# Patient Record
Sex: Female | Born: 1945 | Race: White | Hispanic: No | Marital: Married | Smoking: Never smoker
Health system: Southern US, Community
[De-identification: ages and names within clinical notes are randomized; demographics above are authoritative.]

## PROBLEM LIST (undated history)

## (undated) DIAGNOSIS — Z9889 Other specified postprocedural states: Secondary | ICD-10-CM

## (undated) DIAGNOSIS — J45909 Unspecified asthma, uncomplicated: Secondary | ICD-10-CM

## (undated) DIAGNOSIS — J189 Pneumonia, unspecified organism: Secondary | ICD-10-CM

## (undated) DIAGNOSIS — F32A Depression, unspecified: Secondary | ICD-10-CM

## (undated) DIAGNOSIS — F419 Anxiety disorder, unspecified: Secondary | ICD-10-CM

## (undated) DIAGNOSIS — I10 Essential (primary) hypertension: Secondary | ICD-10-CM

## (undated) DIAGNOSIS — M1990 Unspecified osteoarthritis, unspecified site: Secondary | ICD-10-CM

## (undated) DIAGNOSIS — G4730 Sleep apnea, unspecified: Secondary | ICD-10-CM

## (undated) DIAGNOSIS — F329 Major depressive disorder, single episode, unspecified: Secondary | ICD-10-CM

## (undated) DIAGNOSIS — J329 Chronic sinusitis, unspecified: Secondary | ICD-10-CM

## (undated) DIAGNOSIS — R112 Nausea with vomiting, unspecified: Secondary | ICD-10-CM

## (undated) DIAGNOSIS — E039 Hypothyroidism, unspecified: Secondary | ICD-10-CM

## (undated) DIAGNOSIS — K219 Gastro-esophageal reflux disease without esophagitis: Secondary | ICD-10-CM

## (undated) DIAGNOSIS — E7800 Pure hypercholesterolemia, unspecified: Secondary | ICD-10-CM

## (undated) DIAGNOSIS — K224 Dyskinesia of esophagus: Secondary | ICD-10-CM

## (undated) DIAGNOSIS — R000 Tachycardia, unspecified: Secondary | ICD-10-CM

## (undated) HISTORY — PX: BREAST ENHANCEMENT SURGERY: SHX7

## (undated) HISTORY — PX: CERVICAL SPINE SURGERY: SHX589

## (undated) HISTORY — PX: COMBINED AUGMENTATION MAMMAPLASTY AND ABDOMINOPLASTY: SUR291

## (undated) HISTORY — DX: Chronic sinusitis, unspecified: J32.9

## (undated) HISTORY — PX: AUGMENTATION MAMMAPLASTY: SUR837

## (undated) HISTORY — DX: Tachycardia, unspecified: R00.0

## (undated) HISTORY — DX: Unspecified asthma, uncomplicated: J45.909

## (undated) HISTORY — DX: Dyskinesia of esophagus: K22.4

## (undated) HISTORY — DX: Sleep apnea, unspecified: G47.30

---

## 1997-12-03 ENCOUNTER — Ambulatory Visit (HOSPITAL_COMMUNITY): Admission: RE | Admit: 1997-12-03 | Discharge: 1997-12-03 | Payer: Self-pay | Admitting: Internal Medicine

## 1998-02-02 ENCOUNTER — Encounter: Payer: Self-pay | Admitting: Emergency Medicine

## 1998-02-02 ENCOUNTER — Emergency Department (HOSPITAL_COMMUNITY): Admission: EM | Admit: 1998-02-02 | Discharge: 1998-02-02 | Payer: Self-pay | Admitting: Emergency Medicine

## 1998-02-11 ENCOUNTER — Other Ambulatory Visit: Admission: RE | Admit: 1998-02-11 | Discharge: 1998-02-11 | Payer: Self-pay | Admitting: *Deleted

## 1999-03-03 ENCOUNTER — Other Ambulatory Visit: Admission: RE | Admit: 1999-03-03 | Discharge: 1999-03-03 | Payer: Self-pay | Admitting: *Deleted

## 1999-03-30 ENCOUNTER — Encounter: Admission: RE | Admit: 1999-03-30 | Discharge: 1999-03-30 | Payer: Self-pay | Admitting: *Deleted

## 1999-04-13 ENCOUNTER — Encounter: Payer: Self-pay | Admitting: *Deleted

## 1999-04-13 ENCOUNTER — Encounter: Admission: RE | Admit: 1999-04-13 | Discharge: 1999-04-13 | Payer: Self-pay | Admitting: *Deleted

## 2000-04-12 ENCOUNTER — Other Ambulatory Visit: Admission: RE | Admit: 2000-04-12 | Discharge: 2000-04-12 | Payer: Self-pay | Admitting: *Deleted

## 2001-04-27 ENCOUNTER — Other Ambulatory Visit: Admission: RE | Admit: 2001-04-27 | Discharge: 2001-04-27 | Payer: Self-pay | Admitting: *Deleted

## 2001-05-04 ENCOUNTER — Encounter: Admission: RE | Admit: 2001-05-04 | Discharge: 2001-05-04 | Payer: Self-pay | Admitting: *Deleted

## 2001-05-04 ENCOUNTER — Encounter: Payer: Self-pay | Admitting: *Deleted

## 2002-03-16 ENCOUNTER — Other Ambulatory Visit: Admission: RE | Admit: 2002-03-16 | Discharge: 2002-03-16 | Payer: Self-pay | Admitting: *Deleted

## 2002-07-18 ENCOUNTER — Encounter: Payer: Self-pay | Admitting: Rheumatology

## 2002-07-18 ENCOUNTER — Ambulatory Visit (HOSPITAL_COMMUNITY): Admission: RE | Admit: 2002-07-18 | Discharge: 2002-07-18 | Payer: Self-pay | Admitting: Rheumatology

## 2003-03-18 ENCOUNTER — Other Ambulatory Visit: Admission: RE | Admit: 2003-03-18 | Discharge: 2003-03-18 | Payer: Self-pay | Admitting: *Deleted

## 2007-06-15 HISTORY — PX: HEMORROIDECTOMY: SUR656

## 2007-11-06 ENCOUNTER — Emergency Department (HOSPITAL_COMMUNITY): Admission: EM | Admit: 2007-11-06 | Discharge: 2007-11-06 | Payer: Self-pay | Admitting: Emergency Medicine

## 2007-12-04 ENCOUNTER — Ambulatory Visit: Payer: Self-pay | Admitting: Pulmonary Disease

## 2007-12-04 DIAGNOSIS — R519 Headache, unspecified: Secondary | ICD-10-CM | POA: Insufficient documentation

## 2007-12-04 DIAGNOSIS — I10 Essential (primary) hypertension: Secondary | ICD-10-CM | POA: Insufficient documentation

## 2007-12-04 DIAGNOSIS — R059 Cough, unspecified: Secondary | ICD-10-CM | POA: Insufficient documentation

## 2007-12-04 DIAGNOSIS — R05 Cough: Secondary | ICD-10-CM

## 2007-12-04 DIAGNOSIS — E785 Hyperlipidemia, unspecified: Secondary | ICD-10-CM | POA: Insufficient documentation

## 2007-12-04 DIAGNOSIS — J309 Allergic rhinitis, unspecified: Secondary | ICD-10-CM | POA: Insufficient documentation

## 2007-12-04 DIAGNOSIS — J45909 Unspecified asthma, uncomplicated: Secondary | ICD-10-CM | POA: Insufficient documentation

## 2007-12-04 DIAGNOSIS — R51 Headache: Secondary | ICD-10-CM | POA: Insufficient documentation

## 2007-12-05 ENCOUNTER — Telehealth (INDEPENDENT_AMBULATORY_CARE_PROVIDER_SITE_OTHER): Payer: Self-pay | Admitting: *Deleted

## 2008-03-06 ENCOUNTER — Emergency Department (HOSPITAL_BASED_OUTPATIENT_CLINIC_OR_DEPARTMENT_OTHER): Admission: EM | Admit: 2008-03-06 | Discharge: 2008-03-06 | Payer: Self-pay | Admitting: Emergency Medicine

## 2010-02-14 ENCOUNTER — Emergency Department (HOSPITAL_COMMUNITY): Admission: EM | Admit: 2010-02-14 | Discharge: 2010-02-14 | Payer: Self-pay | Admitting: Emergency Medicine

## 2010-08-27 LAB — POCT CARDIAC MARKERS
CKMB, poc: 1.1 ng/mL (ref 1.0–8.0)
CKMB, poc: <1 ng/mL — ABNORMAL LOW (ref 1.0–8.0)
Myoglobin, poc: 75.5 ng/mL (ref 12–200)
Myoglobin, poc: 91.4 ng/mL (ref 12–200)
Troponin i, poc: <0.05 ng/mL (ref 0.00–0.09)
Troponin i, poc: <0.05 ng/mL (ref 0.00–0.09)

## 2010-08-27 LAB — URINALYSIS, ROUTINE W REFLEX MICROSCOPIC
Bilirubin Urine: NEGATIVE
Hgb urine dipstick: NEGATIVE
Specific Gravity, Urine: 1.013 (ref 1.005–1.030)
Urobilinogen, UA: 0.2 mg/dL (ref 0.0–1.0)
pH: 8 (ref 5.0–8.0)

## 2010-08-27 LAB — DIFFERENTIAL
Basophils Absolute: 0.1 10*3/uL (ref 0.0–0.1)
Eosinophils Relative: 0 % (ref 0–5)
Lymphocytes Relative: 11 % — ABNORMAL LOW (ref 12–46)
Neutro Abs: 10.7 10*3/uL — ABNORMAL HIGH (ref 1.7–7.7)

## 2010-08-27 LAB — COMPREHENSIVE METABOLIC PANEL
BUN: 22 mg/dL (ref 6–23)
CO2: 26 mEq/L (ref 19–32)
Chloride: 103 mEq/L (ref 96–112)
Creatinine, Ser: 0.65 mg/dL (ref 0.4–1.2)
GFR calc non Af Amer: >60 mL/min (ref >60)
Total Bilirubin: 1.3 mg/dL — ABNORMAL HIGH (ref 0.3–1.2)

## 2010-08-27 LAB — CBC
Hemoglobin: 14.4 g/dL (ref 12.0–15.0)
MCH: 29.1 pg (ref 26.0–34.0)
MCV: 84.4 fL (ref 78.0–100.0)
RBC: 4.95 MIL/uL (ref 3.87–5.11)

## 2010-08-27 LAB — D-DIMER, QUANTITATIVE: D-Dimer, Quant: 0.36 ug/mL-FEU (ref 0.00–0.48)

## 2010-12-26 ENCOUNTER — Emergency Department (HOSPITAL_BASED_OUTPATIENT_CLINIC_OR_DEPARTMENT_OTHER)
Admission: EM | Admit: 2010-12-26 | Discharge: 2010-12-26 | Disposition: A | Discharge status: Home / Self Care | Payer: Medicare Other | Attending: Emergency Medicine | Admitting: Emergency Medicine

## 2010-12-26 ENCOUNTER — Encounter: Payer: Self-pay | Admitting: Emergency Medicine

## 2010-12-26 DIAGNOSIS — S61219A Laceration without foreign body of unspecified finger without damage to nail, initial encounter: Secondary | ICD-10-CM

## 2010-12-26 DIAGNOSIS — W268XXA Contact with other sharp object(s), not elsewhere classified, initial encounter: Secondary | ICD-10-CM | POA: Insufficient documentation

## 2010-12-26 DIAGNOSIS — E039 Hypothyroidism, unspecified: Secondary | ICD-10-CM | POA: Insufficient documentation

## 2010-12-26 DIAGNOSIS — I10 Essential (primary) hypertension: Secondary | ICD-10-CM | POA: Insufficient documentation

## 2010-12-26 DIAGNOSIS — M810 Age-related osteoporosis without current pathological fracture: Secondary | ICD-10-CM | POA: Insufficient documentation

## 2010-12-26 DIAGNOSIS — S61209A Unspecified open wound of unspecified finger without damage to nail, initial encounter: Secondary | ICD-10-CM | POA: Insufficient documentation

## 2010-12-26 DIAGNOSIS — F341 Dysthymic disorder: Secondary | ICD-10-CM | POA: Insufficient documentation

## 2010-12-26 DIAGNOSIS — Z79899 Other long term (current) drug therapy: Secondary | ICD-10-CM | POA: Insufficient documentation

## 2010-12-26 DIAGNOSIS — K219 Gastro-esophageal reflux disease without esophagitis: Secondary | ICD-10-CM | POA: Insufficient documentation

## 2010-12-26 DIAGNOSIS — E7800 Pure hypercholesterolemia, unspecified: Secondary | ICD-10-CM | POA: Insufficient documentation

## 2010-12-26 HISTORY — DX: Unspecified osteoarthritis, unspecified site: M19.90

## 2010-12-26 HISTORY — DX: Pure hypercholesterolemia, unspecified: E78.00

## 2010-12-26 HISTORY — DX: Essential (primary) hypertension: I10

## 2010-12-26 HISTORY — DX: Major depressive disorder, single episode, unspecified: F32.9

## 2010-12-26 HISTORY — DX: Gastro-esophageal reflux disease without esophagitis: K21.9

## 2010-12-26 HISTORY — DX: Hypothyroidism, unspecified: E03.9

## 2010-12-26 HISTORY — DX: Depression, unspecified: F32.A

## 2010-12-26 HISTORY — DX: Anxiety disorder, unspecified: F41.9

## 2010-12-26 MED ORDER — TETANUS-DIPHTH-ACELL PERTUSSIS 5-2.5-18.5 LF-MCG/0.5 IM SUSP
0.5000 mL | Freq: Once | INTRAMUSCULAR | Status: AC
  Administered 2010-12-26: 0.5 mL via INTRAMUSCULAR
  Filled 2010-12-26: qty 0.5

## 2010-12-26 MED ORDER — TETANUS-DIPHTH-ACELL PERTUSSIS 5-2-15.5 LF-MCG/0.5 IM SUSP: 0.5000 mL | Freq: Once | INTRAMUSCULAR | Status: DC

## 2010-12-26 MED ORDER — TETANUS-DIPHTH-ACELL PERTUSSIS 5-2-15.5 LF-MCG/0.5 IM SUSP
INTRAMUSCULAR | Status: AC
  Filled 2010-12-26: qty 0.5

## 2010-12-26 NOTE — ED Provider Notes (Signed)
History     Chief Complaint  Patient presents with  . Laceration   Patient is a 65 y.o. female presenting with skin laceration. The history is provided by the patient.  Laceration  The incident occurred less than 1 hour ago. The laceration is located on the right hand. The laceration is 1 cm in size. The laceration mechanism was a a metal edge. The pain is mild. She reports no foreign bodies present. Her tetanus status is out of date.    Past Medical History  Diagnosis Date  . GERD (gastroesophageal reflux disease)   . Hypercholesteremia   . Hypothyroid   . Hypertension   . Depression   . Osteoporosis   . Anxiety   . Arthritis     No past surgical history on file.  No family history on file.  History  Substance Use Topics  . Smoking status: Never Smoker   . Smokeless tobacco: Not on file  . Alcohol Use: No    OB History    Grav Para Term Preterm Abortions TAB SAB Ect Mult Living                  Review of Systems  Constitutional: Negative.   Musculoskeletal: Negative.   Skin: Positive for wound.  Neurological: Negative.     Physical Exam  BP 154/83  Pulse 73  Temp(Src) 98.2 F (36.8 C) (Oral)  Resp 16  SpO2 98%  Physical Exam  Constitutional: She appears well-developed and well-nourished.  Neck: Normal range of motion.  Pulmonary/Chest: Effort normal.  Musculoskeletal: Normal range of motion.  Skin:       1 cm avulsion to distal right thumb. Distal corner of finger nail missing without nail bed involvement. No active bleeding.     ED Course  Procedures  MDM       Rodena Medin, [place] 12/26/10 1934

## 2011-03-15 LAB — URINALYSIS, ROUTINE W REFLEX MICROSCOPIC
Glucose, UA: NEGATIVE
Nitrite: NEGATIVE
Protein, ur: NEGATIVE

## 2011-03-15 LAB — URINE MICROSCOPIC-ADD ON

## 2011-04-22 ENCOUNTER — Encounter: Payer: Self-pay | Admitting: *Deleted

## 2011-04-22 ENCOUNTER — Encounter: Payer: Self-pay | Admitting: Internal Medicine

## 2011-04-22 ENCOUNTER — Ambulatory Visit (INDEPENDENT_AMBULATORY_CARE_PROVIDER_SITE_OTHER): Payer: Medicare Other | Admitting: Internal Medicine

## 2011-04-22 DIAGNOSIS — E785 Hyperlipidemia, unspecified: Secondary | ICD-10-CM

## 2011-04-22 DIAGNOSIS — I10 Essential (primary) hypertension: Secondary | ICD-10-CM

## 2011-04-22 DIAGNOSIS — R000 Tachycardia, unspecified: Secondary | ICD-10-CM

## 2011-04-22 MED ORDER — LOSARTAN POTASSIUM 100 MG PO TABS: 100.0000 mg | ORAL_TABLET | Freq: Every day | ORAL | Status: DC

## 2011-04-22 NOTE — Patient Instructions (Signed)
Your physician recommends that you schedule a follow-up appointment in:  6 weeks with Dr Johney Frame  Your physician has requested that you have an echocardiogram. Echocardiography is a painless test that uses sound waves to create images of your heart. It provides your doctor with information about the size and shape of your heart and how well your heart's chambers and valves are working. This procedure takes approximately one hour. There are no restrictions for this procedure.   Your physician has recommended you make the following change in your medication:  1)stop Losartan/hctz 2)start Losartan 100mg  daily  Your physician has recommended that you wear an event monitor. Event monitors are medical devices that record the heart's electrical activity. Doctors most often [name] these monitors to diagnose arrhythmias. Arrhythmias are problems with the speed or rhythm of the heartbeat. The monitor is a small, portable device. You can wear one while you do your normal daily activities. This is usually used to diagnose what is causing palpitations/syncope (passing out).  Your physician recommends that you return for lab work today

## 2011-04-22 NOTE — Progress Notes (Signed)
Primary Care Physician: Daisy Floro, MD   Elizabeth Walker is a pleasant 65 y.o. patient with a h/o tachycardia who presents today for EP consultation.  She reports that over the past 2-3 years, that she will have episodes of tachypalpitations.    She reports rather abrupt onset with gradual offset.  Over the past 2 years, this occurs with minimal exertion or with bending over.  Episodes typically last over an hour.  She reports lightheadedness, diaphoresis, and presyncope with the episode.  She finds that by lying down, episodes will gradually resolve.  She denies syncope. Her most recent episode occurred when trying to bend over to clean out her shower.  She reports that her heart started "pounded", blood "rushing to her head" and she developed dizziness.  Her symptoms persisted for several hours.  She was evaluated at [facility] ER and her workup was unrevealing.  She finds that during episodes her blood pressure is "normal" but her heart rate is 120s. Today, she denies symptoms of palpitations, chest pain, shortness of breath, orthopnea, PND, lower extremity edema,  or neurologic sequela. The patient is tolerating medications without difficulties and is otherwise without complaint today.   Past Medical History  Diagnosis Date  . GERD (gastroesophageal reflux disease)   . Hypercholesteremia   . Hypothyroid   . Hypertension   . Depression   . Osteoporosis   . Anxiety   . Arthritis   . Sinusitis   . Tachycardia   . Sleep apnea     intolerant to CPAP  . Migraine    Past Surgical History  Procedure Date  . Cesarean section 1967  . Combined augmentation mammaplasty and abdominoplasty     Current Outpatient Prescriptions  Medication Sig Dispense Refill  . atorvastatin (LIPITOR) 20 MG tablet Take 20 mg by mouth daily.        . DULoxetine (CYMBALTA) 30 MG capsule Take 30 mg by mouth daily.        . ergocalciferol (VITAMIN D2) 50000 UNITS capsule Take 50,000 Units by mouth once a week.         . esomeprazole (NEXIUM) 40 MG capsule Take 40 mg by mouth daily before breakfast.        . ibuprofen (ADVIL,MOTRIN) 800 MG tablet Take 800 mg by mouth 3 (three) times daily as needed.       Marland Kitchen levothyroxine (SYNTHROID, LEVOTHROID) 150 MCG tablet Take 150 mcg by mouth daily.        Marland Kitchen LORazepam (ATIVAN) 0.5 MG tablet Take 0.5 mg by mouth 2 (two) times daily as needed.       Marland Kitchen losartan-hydrochlorothiazide (HYZAAR) 50-12.5 MG per tablet Take 1 tablet by mouth daily.        . Melatonin 3 MG TABS Take by mouth. 1/4 tablet, 3-hours before bedtime         No Known Allergies  History   Social History  . Marital Status: Married    Spouse Name: N/A    Number of Children: N/A  . Years of Education: N/A   Occupational History  . Not on file.   Social History Main Topics  . Smoking status: Never Smoker   . Smokeless tobacco: Never Used  . Alcohol Use: Yes     rare wine  . Drug Use: No  . Sexually Active: Not on file   Other Topics Concern  . Not on file   Social History Narrative   Pt lives in [place] with spouse.  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    Family History  Problem Relation Age of Onset  . Coronary artery disease      ROS- All systems are reviewed and negative except as per the HPI above  Physical Exam: Filed Vitals:   04/22/11 1524  BP: 130/78  Pulse: 65  Height: 5' 0.5" (1.537 m)  Weight: 112 lb (50.803 kg)    GEN- The patient is well appearing, alert and oriented x 3 today.   Head- normocephalic, atraumatic Eyes-  Sclera clear, conjunctiva pink Ears- hearing intact Oropharynx- clear Neck- supple, no JVP Lymph- no cervical lymphadenopathy Lungs- Clear to ausculation bilaterally, normal work of breathing Heart- Regular rate and rhythm, no murmurs, rubs or gallops, PMI not laterally displaced GI- soft, NT, ND, + BS Extremities- no clubbing, cyanosis, or edema MS- no significant deformity or atrophy Skin- no rash or lesion Psych- euthymic  mood, full affect Neuro- strength and sensation are intact  EKG today reveals sinus rhythm 65 bpm,  PR 132, QRS 74, Qt c438, anteroseptal infarction pattern, otherwise normal ekg  Assessment and Plan:

## 2011-04-22 NOTE — Assessment & Plan Note (Signed)
The patient presents today for EP consultation regarding symptoms of tachycardia.  She reports abrupt onset but gradual termination of tachycardia, which occurs most commonly when bending over or with exertion.  At this time, it is not clear as to whether this represents reactive sinus tachycardia or a primary arrhythmia.  We will proceed with workup as follows: 1. Echo 2. 21 day event monitor 3. Consider gxt pending results of above  She takes hctz chronically which may actually be a cause for reactive tachycardia.  I will therefore stop hctz today. In addition, I will check CBC, BMET, and TSH to evaluate for metabolic causes of tachycardia.

## 2011-04-22 NOTE — Assessment & Plan Note (Signed)
Above goal Stop hctz as above Increase losartan to 100mg  daily  Consider adding a beta blocker such as nadolol if bp remains elevated pending results of above tachycardia workup

## 2011-04-22 NOTE — Assessment & Plan Note (Signed)
Stable No change required today  

## 2011-04-23 LAB — BASIC METABOLIC PANEL
BUN: 23 mg/dL (ref 6–23)
CO2: 28 mEq/L (ref 19–32)
Calcium: 9.5 mg/dL (ref 8.4–10.5)
Creatinine, Ser: 0.7 mg/dL (ref 0.4–1.2)
Glucose, Bld: 87 mg/dL (ref 70–99)

## 2011-04-23 LAB — CBC WITH DIFFERENTIAL/PLATELET
Basophils Absolute: 0 10*3/uL (ref 0.0–0.1)
Eosinophils Absolute: 0.2 10*3/uL (ref 0.0–0.7)
Lymphocytes Relative: 25.4 % (ref 12.0–46.0)
MCHC: 33.8 g/dL (ref 30.0–36.0)
MCV: 85.1 fl (ref 78.0–100.0)
Monocytes Absolute: 0.6 10*3/uL (ref 0.1–1.0)
Neutrophils Relative %: 64.9 % (ref 43.0–77.0)
Platelets: 280 10*3/uL (ref 150.0–400.0)
RBC: 4.86 Mil/uL (ref 3.87–5.11)
RDW: 13.4 % (ref 11.5–14.6)

## 2011-05-10 ENCOUNTER — Ambulatory Visit (HOSPITAL_COMMUNITY): Payer: Medicare Other | Attending: Cardiology | Admitting: Radiology

## 2011-05-10 ENCOUNTER — Encounter (INDEPENDENT_AMBULATORY_CARE_PROVIDER_SITE_OTHER): Payer: Medicare Other

## 2011-05-10 DIAGNOSIS — I10 Essential (primary) hypertension: Secondary | ICD-10-CM

## 2011-05-10 DIAGNOSIS — R000 Tachycardia, unspecified: Secondary | ICD-10-CM

## 2011-05-10 DIAGNOSIS — R55 Syncope and collapse: Secondary | ICD-10-CM

## 2011-05-10 DIAGNOSIS — I471 Supraventricular tachycardia: Secondary | ICD-10-CM

## 2011-05-10 DIAGNOSIS — I059 Rheumatic mitral valve disease, unspecified: Secondary | ICD-10-CM | POA: Insufficient documentation

## 2011-05-10 DIAGNOSIS — E785 Hyperlipidemia, unspecified: Secondary | ICD-10-CM | POA: Insufficient documentation

## 2011-05-10 DIAGNOSIS — I079 Rheumatic tricuspid valve disease, unspecified: Secondary | ICD-10-CM | POA: Insufficient documentation

## 2011-05-25 ENCOUNTER — Telehealth: Payer: Self-pay | Admitting: Internal Medicine

## 2011-05-25 NOTE — Telephone Encounter (Signed)
Labs,OV note faxed to [facility] @ [phone] 05/25/11/km

## 2011-06-01 ENCOUNTER — Ambulatory Visit: Payer: Medicare Other | Admitting: Internal Medicine

## 2011-06-10 ENCOUNTER — Encounter: Payer: Self-pay | Admitting: Internal Medicine

## 2011-06-10 ENCOUNTER — Ambulatory Visit (INDEPENDENT_AMBULATORY_CARE_PROVIDER_SITE_OTHER): Payer: Medicare Other | Admitting: Internal Medicine

## 2011-06-10 VITALS — BP 122/82 | HR 66 | Ht 60.5 in | Wt 111.8 lb

## 2011-06-10 DIAGNOSIS — R000 Tachycardia, unspecified: Secondary | ICD-10-CM

## 2011-06-10 NOTE — Patient Instructions (Signed)
Your physician recommends that you schedule a follow-up appointment in 3 months with Dr Allred    

## 2011-06-10 NOTE — Assessment & Plan Note (Signed)
Resolved Her event monitor has not revealed arrhythmia thus far, though she continues to wear it.  I will review the monitor in its entirety when she turns it in  I suspect that her tachycardia was sinus tachycardia.  This was likely exacerbated by hctz. Her thyroid replacement has also been adjusted. No changes today  Return in 3 months

## 2011-06-10 NOTE — Progress Notes (Signed)
PCP:  Daisy Floro, MD, MD  The patient presents today for routine cardiology followup.  Since last being seen in our clinic, the patient reports doing very well.   Her tachypalpitations have resolved.  She has done well. Today, she denies symptoms of palpitations, chest pain, shortness of breath, orthopnea, PND, lower extremity edema, dizziness, presyncope or, syncope.  The patient feels that she is tolerating medications without difficulties and is otherwise without complaint today.   Past Medical History  Diagnosis Date  . GERD (gastroesophageal reflux disease)   . Hypercholesteremia   . Hypothyroid   . Hypertension   . Depression   . Osteoporosis   . Anxiety   . Arthritis   . Sinusitis   . Tachycardia   . Sleep apnea     intolerant to CPAP  . Migraine    Past Surgical History  Procedure Date  . Cesarean section 1967  . Combined augmentation mammaplasty and abdominoplasty     Current Outpatient Prescriptions  Medication Sig Dispense Refill  . atorvastatin (LIPITOR) 20 MG tablet Take 20 mg by mouth daily.        . DULoxetine (CYMBALTA) 30 MG capsule Take 30 mg by mouth daily.        . ergocalciferol (VITAMIN D2) 50000 UNITS capsule Take 50,000 Units by mouth once a week.        . esomeprazole (NEXIUM) 40 MG capsule Take 40 mg by mouth daily before breakfast.        . ibuprofen (ADVIL,MOTRIN) 800 MG tablet Take 800 mg by mouth 3 (three) times daily as needed.       Marland Kitchen levothyroxine (SYNTHROID, LEVOTHROID) 112 MCG tablet Take 112 mcg by mouth daily.        Marland Kitchen LORazepam (ATIVAN) 0.5 MG tablet Take 0.5 mg by mouth 2 (two) times daily as needed.       Marland Kitchen losartan (COZAAR) 100 MG tablet Take 1 tablet (100 mg total) by mouth daily.  30 tablet  11  . Melatonin 3 MG TABS Take by mouth. 1/4 tablet, 3-hours before bedtime         No Known Allergies  History   Social History  . Marital Status: Married    Spouse Name: N/A    Number of Children: N/A  . Years of Education: N/A    Occupational History  . Not on file.   Social History Main Topics  . Smoking status: Never Smoker   . Smokeless tobacco: Never Used  . Alcohol Use: Yes     rare wine  . Drug Use: No  . Sexually Active: Not on file   Other Topics Concern  . Not on file   Social History Narrative   Pt lives in [place] with spouse.  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    Family History  Problem Relation Age of Onset  . Coronary artery disease      Physical Exam: Filed Vitals:   06/10/11 1522  BP: 122/82  Pulse: 66  Height: 5' 0.5" (1.537 m)  Weight: 111 lb 12.8 oz (50.712 kg)    GEN- The patient is well appearing, alert and oriented x 3 today.   Head- normocephalic, atraumatic Eyes-  Sclera clear, conjunctiva pink Ears- hearing intact Oropharynx- clear Neck- supple, no JVP Lymph- no cervical lymphadenopathy Lungs- Clear to ausculation bilaterally, normal work of breathing Heart- Regular rate and rhythm, no murmurs, rubs or gallops, PMI not laterally displaced GI- soft, NT, ND, + BS Extremities- no clubbing,  cyanosis, or edema  Assessment and Plan:

## 2011-08-30 ENCOUNTER — Encounter: Payer: Self-pay | Admitting: Internal Medicine

## 2011-08-30 ENCOUNTER — Ambulatory Visit (INDEPENDENT_AMBULATORY_CARE_PROVIDER_SITE_OTHER): Payer: Medicare Other | Admitting: Internal Medicine

## 2011-08-30 VITALS — BP 144/74 | HR 67 | Resp 18 | Ht 63.0 in | Wt 116.1 lb

## 2011-08-30 DIAGNOSIS — R000 Tachycardia, unspecified: Secondary | ICD-10-CM

## 2011-08-30 DIAGNOSIS — I10 Essential (primary) hypertension: Secondary | ICD-10-CM

## 2011-08-30 NOTE — Patient Instructions (Signed)
Your physician recommends that you schedule a follow-up appointment as needed  

## 2011-08-30 NOTE — Progress Notes (Signed)
PCP:  Daisy Floro, MD, MD  The patient presents today for routine cardiology followup.  Since last being seen in our clinic, the patient reports doing very well.   Her tachypalpitations have resolved. Today, she denies symptoms of palpitations, chest pain, shortness of breath, dizziness, presyncope or, syncope.  The patient feels that she is tolerating medications without difficulties and is otherwise without complaint today.   Past Medical History  Diagnosis Date  . GERD (gastroesophageal reflux disease)   . Hypercholesteremia   . Hypothyroid   . Hypertension   . Depression   . Osteoporosis   . Anxiety   . Arthritis   . Sinusitis   . Tachycardia   . Sleep apnea     intolerant to CPAP  . Migraine    Past Surgical History  Procedure Date  . Cesarean section 1967  . Combined augmentation mammaplasty and abdominoplasty     Current Outpatient Prescriptions  Medication Sig Dispense Refill  . atorvastatin (LIPITOR) 20 MG tablet Take 20 mg by mouth daily.        . DULoxetine (CYMBALTA) 30 MG capsule Take 90 mg by mouth daily.       . ergocalciferol (VITAMIN D2) 50000 UNITS capsule Take 50,000 Units by mouth once a week.        . esomeprazole (NEXIUM) 40 MG capsule Take 40 mg by mouth daily before breakfast.        . levothyroxine (SYNTHROID, LEVOTHROID) 112 MCG tablet Take 112 mcg by mouth daily.        Marland Kitchen LORazepam (ATIVAN) 0.5 MG tablet Take 0.5 mg by mouth 2 (two) times daily as needed.       Marland Kitchen losartan (COZAAR) 100 MG tablet Take 1 tablet (100 mg total) by mouth daily.  30 tablet  11  . ibuprofen (ADVIL,MOTRIN) 800 MG tablet Take 800 mg by mouth 3 (three) times daily as needed.       . Melatonin 3 MG TABS Take by mouth. 1/4 tablet, 3-hours before bedtime         No Known Allergies  History   Social History  . Marital Status: Married    Spouse Name: N/A    Number of Children: N/A  . Years of Education: N/A   Occupational History  . Not on file.   Social History  Main Topics  . Smoking status: Never Smoker   . Smokeless tobacco: Never Used  . Alcohol Use: Yes     rare wine  . Drug Use: No  . Sexually Active: Not on file   Other Topics Concern  . Not on file   Social History Narrative   Pt lives in [place] with spouse.  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    Family History  Problem Relation Age of Onset  . Coronary artery disease      Physical Exam: Filed Vitals:   08/30/11 1419  BP: 144/74  Pulse: 67  Resp: 18  Height: 5\' 3"  (1.6 m)  Weight: 116 lb 1.9 oz (52.672 kg)    GEN- The patient is well appearing, alert and oriented x 3 today.   Head- normocephalic, atraumatic Eyes-  Sclera clear, conjunctiva pink Ears- hearing intact Oropharynx- clear Neck- supple, no JVP Lymph- no cervical lymphadenopathy Lungs- Clear to ausculation bilaterally, normal work of breathing Heart- Regular rate and rhythm, no murmurs, rubs or gallops, PMI not laterally displaced GI- soft, NT, ND, + BS Extremities- no clubbing, cyanosis, or edema  Assessment and Plan:

## 2011-08-30 NOTE — Assessment & Plan Note (Signed)
Slightly elevated No changes today She should continue to follow her BP closely and follow-up with Dr Tenny Craw

## 2011-08-30 NOTE — Assessment & Plan Note (Signed)
Resolved off of HCTZ Echo and event monitor were unrevealing  No further workup planned She should contact my office if episodes recur

## 2012-06-16 ENCOUNTER — Other Ambulatory Visit (HOSPITAL_COMMUNITY): Payer: Self-pay | Admitting: Family Medicine

## 2012-06-16 ENCOUNTER — Ambulatory Visit (HOSPITAL_COMMUNITY)
Admission: RE | Admit: 2012-06-16 | Discharge: 2012-06-16 | Disposition: A | Discharge status: Home / Self Care | Payer: Medicare Other | Source: Ambulatory Visit | Attending: Family Medicine | Admitting: Family Medicine

## 2012-06-16 ENCOUNTER — Other Ambulatory Visit: Payer: Self-pay | Admitting: Family Medicine

## 2012-06-16 DIAGNOSIS — R110 Nausea: Secondary | ICD-10-CM | POA: Insufficient documentation

## 2012-06-16 DIAGNOSIS — I639 Cerebral infarction, unspecified: Secondary | ICD-10-CM

## 2012-06-16 DIAGNOSIS — I6789 Other cerebrovascular disease: Secondary | ICD-10-CM | POA: Insufficient documentation

## 2012-06-16 DIAGNOSIS — R269 Unspecified abnormalities of gait and mobility: Secondary | ICD-10-CM | POA: Insufficient documentation

## 2012-06-16 DIAGNOSIS — R42 Dizziness and giddiness: Secondary | ICD-10-CM

## 2012-06-16 DIAGNOSIS — R51 Headache: Secondary | ICD-10-CM | POA: Insufficient documentation

## 2013-04-10 ENCOUNTER — Other Ambulatory Visit: Payer: Medicare Other

## 2013-04-10 ENCOUNTER — Other Ambulatory Visit: Payer: Self-pay | Admitting: *Deleted

## 2013-04-10 DIAGNOSIS — E785 Hyperlipidemia, unspecified: Secondary | ICD-10-CM

## 2013-04-10 DIAGNOSIS — E039 Hypothyroidism, unspecified: Secondary | ICD-10-CM

## 2013-04-12 ENCOUNTER — Other Ambulatory Visit: Payer: Self-pay | Admitting: Endocrinology

## 2013-04-12 ENCOUNTER — Encounter (INDEPENDENT_AMBULATORY_CARE_PROVIDER_SITE_OTHER): Payer: Medicare Other | Admitting: Endocrinology

## 2013-04-12 LAB — TSH: TSH: 1.9 u[IU]/mL (ref 0.350–4.500)

## 2013-04-12 LAB — LIPID PANEL
Cholesterol: 185 mg/dL (ref 0–200)
HDL: 52 mg/dL (ref >39)
Total CHOL/HDL Ratio: 3.6 Ratio
Triglycerides: 99 mg/dL (ref <150)

## 2013-04-18 ENCOUNTER — Encounter: Payer: Self-pay | Admitting: Endocrinology

## 2013-04-18 ENCOUNTER — Ambulatory Visit (INDEPENDENT_AMBULATORY_CARE_PROVIDER_SITE_OTHER): Payer: Medicare Other | Admitting: Endocrinology

## 2013-04-18 VITALS — BP 118/80 | HR 67 | Temp 98.4°F | Resp 12 | Ht 60.0 in | Wt 113.3 lb

## 2013-04-18 DIAGNOSIS — E785 Hyperlipidemia, unspecified: Secondary | ICD-10-CM

## 2013-04-18 DIAGNOSIS — E039 Hypothyroidism, unspecified: Secondary | ICD-10-CM

## 2013-04-18 MED ORDER — LEVOTHYROXINE SODIUM 112 MCG PO TABS: 112.0000 ug | ORAL_TABLET | Freq: Every day | ORAL | Status: DC

## 2013-04-18 MED ORDER — ATORVASTATIN CALCIUM 40 MG PO TABS: 20.0000 mg | ORAL_TABLET | Freq: Every day | ORAL | Status: DC

## 2013-04-18 NOTE — Progress Notes (Signed)
Patient ID: Elizabeth Walker, female   DOB: [birth year], 67 y.o.   MRN: [id number]  Reason for Appointment:  Hypothyroidism, followup visit    History of Present Illness:   The hypothyroidism was first diagnosed  several years ago and was having significant symptoms at onset Complaints are reported by the patient now are none except for occasional fatigue but no new cold sensitivity   or skin changes        The treatments that the patient has taken include Synthroid 112 mcg, 7.5 tabs per week  Her thyroid dose was not changed on her last visit      Compliance with the medical regimen has been as prescribed with taking the tablet in the morning before breakfast.  HYPERLIPIDEMIA: She is on lipid-lowering drugs for several years started by her previous primary care physician because of hypercholesterolemia and fairly strong family history of CAD. She also has hypertension but no diabetes She generally watching her diet but now eating occasional desserts   Orders Only on 04/12/2013  Component Date Value Range Status  . TSH 04/12/2013 1.900  0.350 - 4.500 uIU/mL Final  Orders Only on 04/12/2013  Component Date Value Range Status  . Cholesterol 04/12/2013 185  0 - 200 mg/dL Final   Comment: ATP III Classification:                                < 200        mg/dL        Desirable                               200 - 239     mg/dL        Borderline High                               >= 240        mg/dL        High                             . Triglycerides 04/12/2013 99  <150 mg/dL Final  . HDL 46/96/2952 52  >39 mg/dL Final  . Total CHOL/HDL Ratio 04/12/2013 3.6   Final  . VLDL 04/12/2013 20  0 - 40 mg/dL Final  . LDL Cholesterol 04/12/2013 113* 0 - 99 mg/dL Final   Comment:                            Total Cholesterol/HDL Ratio:CHD Risk                                                 Coronary Heart Disease Risk Table                                                                 Men  Women                                   1/2 Average Risk              3.4        3.3                                       Average Risk              5.0        4.4                                    2X Average Risk              9.6        7.1                                    3X Average Risk             23.4       11.0                          Use the calculated Patient Ratio above and the CHD Risk table                           to determine the patient's CHD Risk.                          ATP III Classification (LDL):                                < 100        mg/dL         Optimal                               100 - 129     mg/dL         Near or Above Optimal                               130 - 159     mg/dL         Borderline High                               160 - 189     mg/dL         High                                > 190        mg/dL         Very High                             .  Free T4 04/12/2013 1.47  0.80 - 1.80 ng/dL Final      Medication List       This list is accurate as of: 04/18/13  2:09 PM.  Always use your most recent med list.               atorvastatin 20 MG tablet  Commonly known as:  LIPITOR  Take 20 mg by mouth daily.     DULoxetine 30 MG capsule  Commonly known as:  CYMBALTA  Take 90 mg by mouth daily.     ergocalciferol 50000 UNITS capsule  Commonly known as:  VITAMIN D2  Take 50,000 Units by mouth once a week.     esomeprazole 40 MG capsule  Commonly known as:  NEXIUM  Take 40 mg by mouth daily before breakfast.     hydrochlorothiazide 12.5 MG capsule  Commonly known as:  MICROZIDE     ibuprofen 800 MG tablet  Commonly known as:  ADVIL,MOTRIN  Take 800 mg by mouth 3 (three) times daily as needed.     levothyroxine 112 MCG tablet  Commonly known as:  SYNTHROID, LEVOTHROID  Take 112 mcg by mouth daily.     LORazepam 0.5 MG tablet  Commonly known as:  ATIVAN  Take 0.5 mg by mouth 2 (two) times daily as needed.     losartan 100  MG tablet  Commonly known as:  COZAAR  Take 100 mg by mouth daily.     Melatonin 3 MG Tabs  Take by mouth. 1/4 tablet, 3-hours before bedtime        Allergies: No Known Allergies  Past Medical History  Diagnosis Date  . GERD (gastroesophageal reflux disease)   . Hypercholesteremia   . Hypothyroid   . Hypertension   . Depression   . Osteoporosis   . Anxiety   . Arthritis   . Sinusitis   . Tachycardia   . Sleep apnea     intolerant to CPAP  . Migraine     Past Surgical History  Procedure Laterality Date  . Cesarean section  1967  . Combined augmentation mammaplasty and abdominoplasty      Family History  Problem Relation Age of Onset  . Coronary artery disease      Social History:  reports that she has never smoked. She has never used smokeless tobacco. She reports that she drinks alcohol. She reports that she does not use illicit drugs.  REVIEW Of SYSTEMS:  No history of diabetes although at some point she may have had borderline high reading   Examination:   BP 118/80  Pulse 67  Temp(Src) 98.4 F (36.9 C)  Resp 12  Ht 5' (1.524 m)  Wt 113 lb 4.8 oz (51.393 kg)  BMI 22.13 kg/m2  SpO2 98%   GENERAL APPEARANCE:  she looks well.    No puffiness of face or hands/ankles        NECK: no thyromegaly.          NEUROLOGIC EXAM: DTRs 2+ bilaterally at biceps with normal relaxation.    Assessments   Hypothyroidism, long-standing with adequate control on current regimen. Subjectively she is doing well and is compliant with her medication  Hypercholesterolemia: Discussed that ideally LDL should be less than 100 and would recommend increasing the Lipitor to 40 mg along with more consistent diet   Treatment:   Continue same Synthroid dosage before breakfast daily. Avoid taking any calcium or iron supplements with the thyroid supplement.   Lipitor  40 mg    Elizabeth Walker 04/18/2013, 2:09 PM

## 2013-04-18 NOTE — Patient Instructions (Signed)
Lipitor 40 mg daily  Same thyroid dose

## 2013-05-22 ENCOUNTER — Other Ambulatory Visit: Payer: Self-pay | Admitting: *Deleted

## 2013-05-22 MED ORDER — ATORVASTATIN CALCIUM 40 MG PO TABS: 40.0000 mg | ORAL_TABLET | Freq: Every day | ORAL | Status: DC

## 2013-08-17 ENCOUNTER — Telehealth: Payer: Self-pay | Admitting: Cardiovascular Disease

## 2013-08-17 NOTE — Telephone Encounter (Signed)
Left message for patient to call (kf)

## 2013-08-28 ENCOUNTER — Other Ambulatory Visit (HOSPITAL_COMMUNITY): Payer: Self-pay | Admitting: Internal Medicine

## 2013-08-28 DIAGNOSIS — R011 Cardiac murmur, unspecified: Secondary | ICD-10-CM

## 2013-09-04 ENCOUNTER — Ambulatory Visit (HOSPITAL_COMMUNITY)
Admission: RE | Admit: 2013-09-04 | Discharge: 2013-09-04 | Disposition: A | Discharge status: Home / Self Care | Payer: Medicare Other | Source: Ambulatory Visit | Attending: Internal Medicine | Admitting: Internal Medicine

## 2013-09-04 DIAGNOSIS — R011 Cardiac murmur, unspecified: Secondary | ICD-10-CM | POA: Insufficient documentation

## 2013-09-04 DIAGNOSIS — I519 Heart disease, unspecified: Secondary | ICD-10-CM

## 2013-09-04 NOTE — Progress Notes (Signed)
2D Echo Performed 09/04/2013    Marygrace Drought, RCS

## 2013-09-13 ENCOUNTER — Other Ambulatory Visit: Payer: Self-pay | Admitting: *Deleted

## 2013-09-13 MED ORDER — ATORVASTATIN CALCIUM 40 MG PO TABS: 40.0000 mg | ORAL_TABLET | Freq: Every day | ORAL | Status: DC

## 2013-10-12 ENCOUNTER — Other Ambulatory Visit: Payer: Medicare Other

## 2013-10-17 ENCOUNTER — Ambulatory Visit: Payer: Medicare Other | Admitting: Endocrinology

## 2013-11-30 ENCOUNTER — Other Ambulatory Visit: Payer: Self-pay | Admitting: Endocrinology

## 2013-12-19 ENCOUNTER — Other Ambulatory Visit: Payer: Self-pay | Admitting: Endocrinology

## 2014-01-04 ENCOUNTER — Other Ambulatory Visit: Payer: Self-pay | Admitting: Endocrinology

## 2014-01-25 ENCOUNTER — Other Ambulatory Visit: Payer: Self-pay | Admitting: Endocrinology

## 2014-03-29 ENCOUNTER — Emergency Department (HOSPITAL_COMMUNITY): Payer: Medicare Other

## 2014-03-29 ENCOUNTER — Encounter (HOSPITAL_COMMUNITY): Payer: Self-pay | Admitting: Emergency Medicine

## 2014-03-29 ENCOUNTER — Emergency Department (HOSPITAL_COMMUNITY)
Admission: EM | Admit: 2014-03-29 | Discharge: 2014-03-29 | Disposition: A | Discharge status: Home / Self Care | Payer: Medicare Other | Attending: Emergency Medicine | Admitting: Emergency Medicine

## 2014-03-29 ENCOUNTER — Emergency Department (INDEPENDENT_AMBULATORY_CARE_PROVIDER_SITE_OTHER)
Admission: EM | Admit: 2014-03-29 | Discharge: 2014-03-29 | Disposition: A | Discharge status: Nursing Home (Includes ICF) | Payer: Medicare Other | Source: Home / Self Care | Attending: Family Medicine | Admitting: Family Medicine

## 2014-03-29 DIAGNOSIS — E780 Pure hypercholesterolemia: Secondary | ICD-10-CM | POA: Insufficient documentation

## 2014-03-29 DIAGNOSIS — F419 Anxiety disorder, unspecified: Secondary | ICD-10-CM | POA: Diagnosis not present

## 2014-03-29 DIAGNOSIS — Z7952 Long term (current) use of systemic steroids: Secondary | ICD-10-CM | POA: Diagnosis not present

## 2014-03-29 DIAGNOSIS — Z8739 Personal history of other diseases of the musculoskeletal system and connective tissue: Secondary | ICD-10-CM | POA: Diagnosis not present

## 2014-03-29 DIAGNOSIS — R1011 Right upper quadrant pain: Secondary | ICD-10-CM

## 2014-03-29 DIAGNOSIS — K219 Gastro-esophageal reflux disease without esophagitis: Secondary | ICD-10-CM | POA: Diagnosis not present

## 2014-03-29 DIAGNOSIS — I10 Essential (primary) hypertension: Secondary | ICD-10-CM | POA: Insufficient documentation

## 2014-03-29 DIAGNOSIS — Z79899 Other long term (current) drug therapy: Secondary | ICD-10-CM | POA: Insufficient documentation

## 2014-03-29 DIAGNOSIS — R05 Cough: Secondary | ICD-10-CM | POA: Diagnosis present

## 2014-03-29 DIAGNOSIS — F329 Major depressive disorder, single episode, unspecified: Secondary | ICD-10-CM | POA: Diagnosis not present

## 2014-03-29 DIAGNOSIS — Z9981 Dependence on supplemental oxygen: Secondary | ICD-10-CM | POA: Diagnosis not present

## 2014-03-29 DIAGNOSIS — E039 Hypothyroidism, unspecified: Secondary | ICD-10-CM | POA: Insufficient documentation

## 2014-03-29 DIAGNOSIS — J189 Pneumonia, unspecified organism: Secondary | ICD-10-CM

## 2014-03-29 DIAGNOSIS — J159 Unspecified bacterial pneumonia: Secondary | ICD-10-CM | POA: Insufficient documentation

## 2014-03-29 DIAGNOSIS — G4730 Sleep apnea, unspecified: Secondary | ICD-10-CM | POA: Diagnosis not present

## 2014-03-29 DIAGNOSIS — R110 Nausea: Secondary | ICD-10-CM | POA: Diagnosis not present

## 2014-03-29 DIAGNOSIS — R509 Fever, unspecified: Secondary | ICD-10-CM

## 2014-03-29 DIAGNOSIS — R109 Unspecified abdominal pain: Secondary | ICD-10-CM | POA: Insufficient documentation

## 2014-03-29 DIAGNOSIS — R059 Cough, unspecified: Secondary | ICD-10-CM

## 2014-03-29 LAB — URINALYSIS, ROUTINE W REFLEX MICROSCOPIC
BILIRUBIN URINE: NEGATIVE
Glucose, UA: NEGATIVE mg/dL
KETONES UR: NEGATIVE mg/dL
Leukocytes, UA: NEGATIVE
Nitrite: NEGATIVE
PH: 6 (ref 5.0–8.0)
Protein, ur: NEGATIVE mg/dL
Specific Gravity, Urine: 1.018 (ref 1.005–1.030)
Urobilinogen, UA: 1 mg/dL (ref 0.0–1.0)

## 2014-03-29 LAB — CBC WITH DIFFERENTIAL/PLATELET
BASOS ABS: 0 10*3/uL (ref 0.0–0.1)
BASOS PCT: 0 % (ref 0–1)
EOS ABS: 0 10*3/uL (ref 0.0–0.7)
EOS PCT: 0 % (ref 0–5)
HCT: 42.7 % (ref 36.0–46.0)
HEMOGLOBIN: 14.3 g/dL (ref 12.0–15.0)
Lymphocytes Relative: 7 % — ABNORMAL LOW (ref 12–46)
Lymphs Abs: 0.7 10*3/uL (ref 0.7–4.0)
MCH: 27.9 pg (ref 26.0–34.0)
MCHC: 33.5 g/dL (ref 30.0–36.0)
MCV: 83.4 fL (ref 78.0–100.0)
MONO ABS: 0.6 10*3/uL (ref 0.1–1.0)
Monocytes Relative: 6 % (ref 3–12)
NEUTROS ABS: 8.5 10*3/uL — AB (ref 1.7–7.7)
NEUTROS PCT: 87 % — AB (ref 43–77)
Platelets: 211 10*3/uL (ref 150–400)
RBC: 5.12 MIL/uL — AB (ref 3.87–5.11)
RDW: 13.1 % (ref 11.5–15.5)
WBC: 9.7 10*3/uL (ref 4.0–10.5)

## 2014-03-29 LAB — COMPREHENSIVE METABOLIC PANEL
ALT: 26 U/L (ref 0–35)
AST: 20 U/L (ref 0–37)
Albumin: 3.4 g/dL — ABNORMAL LOW (ref 3.5–5.2)
Alkaline Phosphatase: 93 U/L (ref 39–117)
Anion gap: 12 (ref 5–15)
BUN: 12 mg/dL (ref 6–23)
CALCIUM: 9.3 mg/dL (ref 8.4–10.5)
CO2: 30 mEq/L (ref 19–32)
Chloride: 97 mEq/L (ref 96–112)
Creatinine, Ser: 0.64 mg/dL (ref 0.50–1.10)
GFR calc Af Amer: >90 mL/min (ref >90)
GFR, EST NON AFRICAN AMERICAN: 88 mL/min — AB (ref >90)
Glucose, Bld: 122 mg/dL — ABNORMAL HIGH (ref 70–99)
Potassium: 3.5 mEq/L — ABNORMAL LOW (ref 3.7–5.3)
SODIUM: 139 meq/L (ref 137–147)
Total Bilirubin: 0.8 mg/dL (ref 0.3–1.2)
Total Protein: 7.6 g/dL (ref 6.0–8.3)

## 2014-03-29 LAB — URINE MICROSCOPIC-ADD ON

## 2014-03-29 LAB — LIPASE, BLOOD: LIPASE: 21 U/L (ref 11–59)

## 2014-03-29 LAB — I-STAT CG4 LACTIC ACID, ED: Lactic Acid, Venous: 1.38 mmol/L (ref 0.5–2.2)

## 2014-03-29 MED ORDER — LEVOFLOXACIN 750 MG PO TABS
750.0000 mg | ORAL_TABLET | Freq: Once | ORAL | Status: DC
  Filled 2014-03-29: qty 1

## 2014-03-29 MED ORDER — ACETAMINOPHEN 325 MG PO TABS
650.0000 mg | ORAL_TABLET | Freq: Once | ORAL | Status: DC
  Filled 2014-03-29: qty 2

## 2014-03-29 MED ORDER — LEVOFLOXACIN 750 MG PO TABS: 750.0000 mg | ORAL_TABLET | Freq: Every day | ORAL | Status: DC

## 2014-03-29 NOTE — ED Notes (Signed)
Pt dc home by another nurse, pt not available on her room when try to give her first dose of abx and Tylenol for HA, Dr. Betsey Holiday notified, pt called to her home phone and leave a message to call back.

## 2014-03-29 NOTE — ED Provider Notes (Signed)
Medical screening examination/treatment/procedure(s) were performed by resident physician or non-physician practitioner and as supervising physician I was immediately available for consultation/collaboration.   Pauline Good MD.   Billy Fischer, MD 03/29/14 [phone]

## 2014-03-29 NOTE — ED Notes (Signed)
Patient transported to Ultrasound 

## 2014-03-29 NOTE — Discharge Instructions (Signed)

## 2014-03-29 NOTE — ED Notes (Signed)
Pt c/o upper abd pain, fever x 1 week; sent from [facility] for r/o gall bladder; pt with positive murphys sign per [facility]

## 2014-03-29 NOTE — ED Provider Notes (Signed)
CSN: [id number]     Arrival date & time 03/29/14  1022 History   None    Chief Complaint  Patient presents with  . Cough   (Consider location/radiation/quality/duration/timing/severity/associated sxs/prior Treatment) HPI    68 year old female presents complaining of fever to 102F this morning, chest pain, body aches, nausea without vomiting, and now with cough. Her symptoms have been present for about 5 days and progressively worsening. She has been taking over-the-counter medication without relief. She has never had this before. No recent travel or sick contacts. No shortness of breath.  Past Medical History  Diagnosis Date  . GERD (gastroesophageal reflux disease)   . Hypercholesteremia   . Hypothyroid   . Hypertension   . Depression   . Osteoporosis   . Anxiety   . Arthritis   . Sinusitis   . Tachycardia   . Sleep apnea     intolerant to CPAP  . Migraine    Past Surgical History  Procedure Laterality Date  . Cesarean section  1967  . Combined augmentation mammaplasty and abdominoplasty     Family History  Problem Relation Age of Onset  . Coronary artery disease    . Heart disease Father     died age 60  . Heart disease Sister   . Diabetes Sister   . Heart disease Brother    History  Substance Use Topics  . Smoking status: Never Smoker   . Smokeless tobacco: Never Used  . Alcohol Use: Yes     Comment: rare wine   OB History   Grav Para Term Preterm Abortions TAB SAB Ect Mult Living                 Review of Systems  Constitutional: Positive for fever, chills and fatigue.  HENT: Negative for congestion and sore throat.   Respiratory: Positive for cough. Negative for chest tightness, shortness of breath and wheezing.   Cardiovascular: Positive for chest pain.  Gastrointestinal: Positive for nausea and abdominal pain. Negative for vomiting, diarrhea and constipation.  Musculoskeletal: Positive for arthralgias and myalgias.  All other systems reviewed and  are negative.   Allergies  Review of patient's allergies indicates no known allergies.  Home Medications   Prior to Admission medications   Medication Sig Start Date End Date Taking? Authorizing Provider  atorvastatin (LIPITOR) 40 MG tablet Take 1 tablet (40 mg total) by mouth daily. 09/13/13   Elayne Snare, MD  DULoxetine (CYMBALTA) 30 MG capsule Take 90 mg by mouth daily.     Historical Provider, MD  ergocalciferol (VITAMIN D2) 50000 UNITS capsule Take 50,000 Units by mouth once a week.      Historical Provider, MD  esomeprazole (NEXIUM) 40 MG capsule Take 40 mg by mouth daily before breakfast.      Historical Provider, MD  hydrochlorothiazide (MICROZIDE) 12.5 MG capsule  03/16/13   Historical Provider, MD  ibuprofen (ADVIL,MOTRIN) 800 MG tablet Take 800 mg by mouth 3 (three) times daily as needed.     Historical Provider, MD  levothyroxine (SYNTHROID, LEVOTHROID) 112 MCG tablet TAKE ONE TABLET BY MOUTH ONE TIME DAILY, PLUS AN EXTRA HALF TABLET PER WEEK.     Elayne Snare, MD  LORazepam (ATIVAN) 0.5 MG tablet Take 0.5 mg by mouth 2 (two) times daily as needed.     Historical Provider, MD  losartan (COZAAR) 100 MG tablet Take 100 mg by mouth daily.    Historical Provider, MD  Melatonin 3 MG TABS Take by mouth. 1/4  tablet, 3-hours before bedtime     Historical Provider, MD   BP 145/86  Pulse 100  Temp(Src) 98.8 F (37.1 C) (Oral)  Resp 14  SpO2 98% Physical Exam  Nursing note and vitals reviewed. Constitutional: She is oriented to person, place, and time. Vital signs are normal. She appears well-developed and well-nourished. No distress.  HENT:  Head: Normocephalic and atraumatic.  Cardiovascular: Normal rate, regular rhythm and normal heart sounds.   Pulmonary/Chest: Effort normal and breath sounds normal. No respiratory distress.  Abdominal: Normal appearance and bowel sounds are normal. There is tenderness in the right upper quadrant. There is positive Murphy's sign. There is no  rigidity, no rebound, no guarding, no CVA tenderness and no tenderness at McBurney's point.  Neurological: She is alert and oriented to person, place, and time. She has normal strength. Coordination normal.  Skin: Skin is warm and dry. No rash noted. She is not diaphoretic.  Psychiatric: She has a normal mood and affect. Judgment normal.    ED Course  Procedures (including critical care time) Labs Review Labs Reviewed - No data to display  Imaging Review No results found.   MDM   1. Other specified fever   2. Right upper quadrant pain   3. Cough    Murphy's sign was very positive, she yelled out in pain during this exam. Given the prolonged fever, she may have cholecystitis. Transferred to ED for further evaluation.   Liam Graham, PA-C 03/29/14 1200

## 2014-03-29 NOTE — ED Notes (Signed)
Pt reports   Symptoms  Of  Cough   Congested       And      Nasal  Drainage       And     Had  Fever  Yesterday       Pt  Reports  Body  Aches  As  Well  With  Nausea   And   Gagging

## 2014-03-29 NOTE — ED Provider Notes (Signed)
CSN: [id number]     Arrival date & time 03/29/14  1207 History   First MD Initiated Contact with Patient 03/29/14 1611     Chief Complaint  Patient presents with  . Abdominal Pain  . Nausea     (Consider location/radiation/quality/duration/timing/severity/associated sxs/prior Treatment) HPI Comments: Patient presents to the ER for evaluation of illness for one week. Patient reports that she has had generalized fatigue, generalized body aches, nonproductive cough for nearly one week. She developed a fever today. She was seen in urgent care and sent to the ER for further evaluation. The patient reportedly had tenderness in the right upper quadrant urgent care and was sent for evaluation of possible gallbladder disease. She has had nausea without vomiting associated with her symptoms.  Patient is a 68 y.o. female presenting with abdominal pain.  Abdominal Pain Associated symptoms: chills, cough, fatigue, fever and nausea   Associated symptoms: no vomiting     Past Medical History  Diagnosis Date  . GERD (gastroesophageal reflux disease)   . Hypercholesteremia   . Hypothyroid   . Hypertension   . Depression   . Osteoporosis   . Anxiety   . Arthritis   . Sinusitis   . Tachycardia   . Sleep apnea     intolerant to CPAP  . Migraine    Past Surgical History  Procedure Laterality Date  . Cesarean section  1967  . Combined augmentation mammaplasty and abdominoplasty     Family History  Problem Relation Age of Onset  . Coronary artery disease    . Heart disease Father     died age 42  . Heart disease Sister   . Diabetes Sister   . Heart disease Brother    History  Substance Use Topics  . Smoking status: Never Smoker   . Smokeless tobacco: Never Used  . Alcohol Use: Yes     Comment: rare wine   OB History   Grav Para Term Preterm Abortions TAB SAB Ect Mult Living                 Review of Systems  Constitutional: Positive for fever, chills and fatigue.   Respiratory: Positive for cough.   Gastrointestinal: Positive for nausea and abdominal pain. Negative for vomiting.  All other systems reviewed and are negative.     Allergies  Review of patient's allergies indicates no known allergies.  Home Medications   Prior to Admission medications   Medication Sig Start Date End Date Taking? Authorizing Provider  atorvastatin (LIPITOR) 40 MG tablet Take 1 tablet (40 mg total) by mouth daily. 09/13/13  Yes Elayne Snare, MD  carboxymethylcellulose (REFRESH PLUS) 0.5 % SOLN Place 1 drop into both eyes daily as needed (for dry eyes).   Yes Historical Provider, MD  chlorpheniramine-HYDROcodone (TUSSIONEX) 10-8 MG/5ML LQCR Take 5 mLs by mouth every 12 (twelve) hours as needed for cough.   Yes Historical Provider, MD  DULoxetine (CYMBALTA) 30 MG capsule Take 90 mg by mouth daily.    Yes Historical Provider, MD  ergocalciferol (VITAMIN D2) 50000 UNITS capsule Take 50,000 Units by mouth every Saturday.    Yes Historical Provider, MD  esomeprazole (NEXIUM) 40 MG capsule Take 40 mg by mouth daily before breakfast.    Yes Historical Provider, MD  hydrochlorothiazide (MICROZIDE) 12.5 MG capsule Take 12.5 mg by mouth daily.  03/16/13  Yes Historical Provider, MD  levothyroxine (SYNTHROID, LEVOTHROID) 112 MCG tablet Take 56-112 mcg by mouth daily before breakfast. Also takes extra  half of tab on Saturdays only   Yes Historical Provider, MD  LORazepam (ATIVAN) 0.5 MG tablet Take 0.5 mg by mouth 2 (two) times daily as needed.    Yes Historical Provider, MD  losartan (COZAAR) 100 MG tablet Take 100 mg by mouth daily.   Yes Historical Provider, MD  prednisoLONE acetate (PRED FORTE) 1 % ophthalmic suspension Place 1 drop into both eyes at bedtime.   Yes Historical Provider, MD   BP 151/85  Pulse 90  Temp(Src) 98.6 F (37 C) (Oral)  Resp 14  Ht 5' 0.5" (1.537 m)  Wt 112 lb (50.803 kg)  BMI 21.51 kg/m2  SpO2 100% Physical Exam  Constitutional: She is oriented to  person, place, and time. She appears well-developed and well-nourished. No distress.  HENT:  Head: Normocephalic and atraumatic.  Right Ear: Hearing normal.  Left Ear: Hearing normal.  Nose: Nose normal.  Mouth/Throat: Oropharynx is clear and moist and mucous membranes are normal.  Eyes: Conjunctivae and EOM are normal. Pupils are equal, round, and reactive to light.  Neck: Normal range of motion. Neck supple.  Cardiovascular: Regular rhythm, S1 normal and S2 normal.  Exam reveals no gallop and no friction rub.   No murmur heard. Pulmonary/Chest: Effort normal and breath sounds normal. No respiratory distress. She exhibits no tenderness.  Abdominal: Soft. Normal appearance and bowel sounds are normal. There is no hepatosplenomegaly. There is no tenderness. There is no rebound, no guarding, no tenderness at McBurney's point and negative Murphy's sign. No hernia.  Patient does NOT have a Murphy sign.  There is no significant tenderness in the right upper quadrant. She does have some tenderness over the right lateral costal margin which is likely what was seen at urgent care.  Musculoskeletal: Normal range of motion.  Neurological: She is alert and oriented to person, place, and time. She has normal strength. No cranial nerve deficit or sensory deficit. Coordination normal. GCS eye subscore is 4. GCS verbal subscore is 5. GCS motor subscore is 6.  Skin: Skin is warm, dry and intact. No rash noted. No cyanosis.  Psychiatric: She has a normal mood and affect. Her speech is normal and behavior is normal. Thought content normal.    ED Course  Procedures (including critical care time) Labs Review Labs Reviewed  CBC WITH DIFFERENTIAL - Abnormal; Notable for the following:    RBC 5.12 (*)    Neutrophils Relative % 87 (*)    Neutro Abs 8.5 (*)    Lymphocytes Relative 7 (*)    All other components within normal limits  COMPREHENSIVE METABOLIC PANEL - Abnormal; Notable for the following:     Potassium 3.5 (*)    Glucose, Bld 122 (*)    Albumin 3.4 (*)    GFR calc non Af Amer 88 (*)    All other components within normal limits  URINALYSIS, ROUTINE W REFLEX MICROSCOPIC - Abnormal; Notable for the following:    Hgb urine dipstick SMALL (*)    All other components within normal limits  LIPASE, BLOOD  URINE MICROSCOPIC-ADD ON  INFLUENZA PANEL BY PCR (TYPE A & B, H1N1)  I-STAT CG4 LACTIC ACID, ED    Imaging Review Dg Chest 2 View  03/29/2014   CLINICAL DATA:  Upper abdominal pain and fever.  EXAM: CHEST  2 VIEW  COMPARISON:  02/14/2010  FINDINGS: Heart size appears normal. No pleural effusion or edema. Perihilar opacity within the right midlung is new from the previous exam. Left lung is clear.  The visualized osseous structures are unremarkable.  IMPRESSION: 1. Right midlung perihilar opacity. In the acute setting this may represent pneumonia. Radiographic follow-up advised to ensure resolution.   Electronically Signed   By: Kerby Moors M.D.   On: 03/29/2014 17:59   US Abdomen Complete  03/29/2014   CLINICAL DATA:  Right upper quadrant pain and fever.  EXAM: ULTRASOUND ABDOMEN COMPLETE  COMPARISON:  None.  FINDINGS: Gallbladder: No gallstones or wall thickening visualized. No sonographic Murphy sign noted.  Common bile duct: Diameter: 0.4 cm  Liver: No focal lesion identified. Within normal limits in parenchymal echogenicity.  IVC: No abnormality visualized.  Pancreas: Visualized portion unremarkable.  Spleen: Size and appearance within normal limits.  Right Kidney: Length: 9.7 cm. Echogenicity within normal limits. No mass or hydronephrosis visualized.  Left Kidney: Length: 10.1 cm. Echogenicity within normal limits. No mass. Mild caliectasis noted.  Abdominal aorta: No aneurysm visualized.  Other findings: None.  IMPRESSION: Mild caliectasis left kidney. Cause for this finding is not identified.  Normal-appearing gallbladder.  Negative for gallstones.   Electronically Signed   By:  Inge Rise M.D.   On: 03/29/2014 18:58     EKG Interpretation None      MDM   Final diagnoses:  None   community acquired pneumonia  Patient presents to the ER for evaluation at the request of urgent care. Patient was seen for cough, congestion, fever that has been ongoing for nearly a week. She has had malaise and body aches. She reportedly had right upper quadrant tenderness at urgent care which prompted the transfer. My examination did not reveal any signs of a Murphy sign or peritonitis. LFTs are normal. Ultrasound does not show any obvious abnormality.  Patient's chest x-ray does show pneumonia. This would explain the patient's presentation. She is not hypoxic. She is appropriate for outpatient management with Levaquin.    Orpah Greek, MD 03/29/14 [phone]

## 2014-03-30 LAB — INFLUENZA PANEL BY PCR (TYPE A & B)
H1N1FLUPCR: NOT DETECTED
INFLAPCR: NEGATIVE
Influenza B By PCR: NEGATIVE

## 2014-04-02 ENCOUNTER — Emergency Department (HOSPITAL_COMMUNITY): Payer: Medicare Other

## 2014-04-02 ENCOUNTER — Encounter (HOSPITAL_COMMUNITY): Payer: Self-pay | Admitting: Emergency Medicine

## 2014-04-02 ENCOUNTER — Inpatient Hospital Stay (HOSPITAL_COMMUNITY): Payer: Medicare Other

## 2014-04-02 ENCOUNTER — Inpatient Hospital Stay (HOSPITAL_COMMUNITY)
Admission: EM | Admit: 2014-04-02 | Discharge: 2014-04-08 | DRG: 194 | Disposition: A | Discharge status: Home / Self Care | Payer: Medicare Other | Attending: Internal Medicine | Admitting: Internal Medicine

## 2014-04-02 DIAGNOSIS — K5900 Constipation, unspecified: Secondary | ICD-10-CM | POA: Diagnosis present

## 2014-04-02 DIAGNOSIS — J45901 Unspecified asthma with (acute) exacerbation: Secondary | ICD-10-CM

## 2014-04-02 DIAGNOSIS — J4521 Mild intermittent asthma with (acute) exacerbation: Secondary | ICD-10-CM

## 2014-04-02 DIAGNOSIS — K219 Gastro-esophageal reflux disease without esophagitis: Secondary | ICD-10-CM | POA: Diagnosis present

## 2014-04-02 DIAGNOSIS — Z79899 Other long term (current) drug therapy: Secondary | ICD-10-CM

## 2014-04-02 DIAGNOSIS — E876 Hypokalemia: Secondary | ICD-10-CM | POA: Diagnosis present

## 2014-04-02 DIAGNOSIS — R0602 Shortness of breath: Secondary | ICD-10-CM | POA: Diagnosis present

## 2014-04-02 DIAGNOSIS — Z7722 Contact with and (suspected) exposure to environmental tobacco smoke (acute) (chronic): Secondary | ICD-10-CM | POA: Diagnosis present

## 2014-04-02 DIAGNOSIS — J189 Pneumonia, unspecified organism: Secondary | ICD-10-CM | POA: Diagnosis present

## 2014-04-02 DIAGNOSIS — R000 Tachycardia, unspecified: Secondary | ICD-10-CM

## 2014-04-02 DIAGNOSIS — R197 Diarrhea, unspecified: Secondary | ICD-10-CM | POA: Diagnosis present

## 2014-04-02 DIAGNOSIS — R0789 Other chest pain: Secondary | ICD-10-CM

## 2014-04-02 DIAGNOSIS — R05 Cough: Secondary | ICD-10-CM

## 2014-04-02 DIAGNOSIS — I10 Essential (primary) hypertension: Secondary | ICD-10-CM | POA: Diagnosis present

## 2014-04-02 DIAGNOSIS — E860 Dehydration: Secondary | ICD-10-CM | POA: Diagnosis present

## 2014-04-02 DIAGNOSIS — J4541 Moderate persistent asthma with (acute) exacerbation: Secondary | ICD-10-CM

## 2014-04-02 DIAGNOSIS — R059 Cough, unspecified: Secondary | ICD-10-CM

## 2014-04-02 DIAGNOSIS — E039 Hypothyroidism, unspecified: Secondary | ICD-10-CM | POA: Diagnosis present

## 2014-04-02 DIAGNOSIS — R0600 Dyspnea, unspecified: Secondary | ICD-10-CM

## 2014-04-02 HISTORY — DX: Pneumonia, unspecified organism: J18.9

## 2014-04-02 LAB — BASIC METABOLIC PANEL
Anion gap: 13 (ref 5–15)
BUN: 10 mg/dL (ref 6–23)
CALCIUM: 8.2 mg/dL — AB (ref 8.4–10.5)
CO2: 25 mEq/L (ref 19–32)
CREATININE: 0.63 mg/dL (ref 0.50–1.10)
Chloride: 102 mEq/L (ref 96–112)
GFR calc non Af Amer: 89 mL/min — ABNORMAL LOW (ref >90)
Glucose, Bld: 151 mg/dL — ABNORMAL HIGH (ref 70–99)
Potassium: 3.9 mEq/L (ref 3.7–5.3)
Sodium: 140 mEq/L (ref 137–147)

## 2014-04-02 LAB — CBC WITH DIFFERENTIAL/PLATELET
Basophils Absolute: 0 10*3/uL (ref 0.0–0.1)
Basophils Relative: 0 % (ref 0–1)
EOS PCT: 1 % (ref 0–5)
Eosinophils Absolute: 0.1 10*3/uL (ref 0.0–0.7)
HCT: 37.6 % (ref 36.0–46.0)
HEMOGLOBIN: 12.6 g/dL (ref 12.0–15.0)
LYMPHS ABS: 1 10*3/uL (ref 0.7–4.0)
LYMPHS PCT: 10 % — AB (ref 12–46)
MCH: 27.8 pg (ref 26.0–34.0)
MCHC: 33.5 g/dL (ref 30.0–36.0)
MCV: 83 fL (ref 78.0–100.0)
MONOS PCT: 10 % (ref 3–12)
Monocytes Absolute: 1 10*3/uL (ref 0.1–1.0)
NEUTROS PCT: 79 % — AB (ref 43–77)
Neutro Abs: 7.8 10*3/uL — ABNORMAL HIGH (ref 1.7–7.7)
PLATELETS: 263 10*3/uL (ref 150–400)
RBC: 4.53 MIL/uL (ref 3.87–5.11)
RDW: 12.9 % (ref 11.5–15.5)
WBC: 9.8 10*3/uL (ref 4.0–10.5)

## 2014-04-02 LAB — COMPREHENSIVE METABOLIC PANEL
ALK PHOS: 81 U/L (ref 39–117)
ALT: 17 U/L (ref 0–35)
AST: 16 U/L (ref 0–37)
Albumin: 3 g/dL — ABNORMAL LOW (ref 3.5–5.2)
Anion gap: 14 (ref 5–15)
BUN: 14 mg/dL (ref 6–23)
CO2: 28 meq/L (ref 19–32)
Calcium: 8.8 mg/dL (ref 8.4–10.5)
Chloride: 97 mEq/L (ref 96–112)
Creatinine, Ser: 0.73 mg/dL (ref 0.50–1.10)
GFR, EST NON AFRICAN AMERICAN: 85 mL/min — AB (ref >90)
GLUCOSE: 116 mg/dL — AB (ref 70–99)
POTASSIUM: 2.6 meq/L — AB (ref 3.7–5.3)
Sodium: 139 mEq/L (ref 137–147)
Total Bilirubin: 0.5 mg/dL (ref 0.3–1.2)
Total Protein: 6.8 g/dL (ref 6.0–8.3)

## 2014-04-02 LAB — TROPONIN I: Troponin I: <0.3 ng/mL (ref <0.30)

## 2014-04-02 LAB — SEDIMENTATION RATE: Sed Rate: 73 mm/hr — ABNORMAL HIGH (ref 0–22)

## 2014-04-02 LAB — I-STAT TROPONIN, ED: Troponin i, poc: 0.01 ng/mL (ref 0.00–0.08)

## 2014-04-02 LAB — MAGNESIUM: Magnesium: 1.9 mg/dL (ref 1.5–2.5)

## 2014-04-02 LAB — PRO B NATRIURETIC PEPTIDE: Pro B Natriuretic peptide (BNP): 230.6 pg/mL — ABNORMAL HIGH (ref 0–125)

## 2014-04-02 MED ORDER — ALBUTEROL SULFATE (2.5 MG/3ML) 0.083% IN NEBU
2.5000 mg | INHALATION_SOLUTION | Freq: Four times a day (QID) | RESPIRATORY_TRACT | Status: DC
  Administered 2014-04-02: 2.5 mg via RESPIRATORY_TRACT
  Filled 2014-04-02: qty 3

## 2014-04-02 MED ORDER — LORAZEPAM 0.5 MG PO TABS
0.5000 mg | ORAL_TABLET | Freq: Two times a day (BID) | ORAL | Status: DC | PRN
  Administered 2014-04-03 – 2014-04-07 (×5): 0.5 mg via ORAL
  Filled 2014-04-02 (×6): qty 1

## 2014-04-02 MED ORDER — LEVOTHYROXINE SODIUM 112 MCG PO TABS
112.0000 ug | ORAL_TABLET | ORAL | Status: DC
  Administered 2014-04-03 – 2014-04-08 (×5): 112 ug via ORAL
  Filled 2014-04-02 (×7): qty 1

## 2014-04-02 MED ORDER — SODIUM CHLORIDE 0.9 % IJ SOLN
3.0000 mL | Freq: Two times a day (BID) | INTRAMUSCULAR | Status: DC
  Administered 2014-04-02 – 2014-04-07 (×5): 3 mL via INTRAVENOUS

## 2014-04-02 MED ORDER — DEXTROSE 5 % IV SOLN
2.0000 g | INTRAVENOUS | Status: AC
  Administered 2014-04-02: 2 g via INTRAVENOUS
  Filled 2014-04-02: qty 2

## 2014-04-02 MED ORDER — DEXTROSE 5 % IV SOLN: 2.0000 g | INTRAVENOUS | Status: DC

## 2014-04-02 MED ORDER — PREDNISOLONE ACETATE 1 % OP SUSP
1.0000 [drp] | Freq: Every day | OPHTHALMIC | Status: DC
  Administered 2014-04-02 – 2014-04-06 (×5): 1 [drp] via OPHTHALMIC
  Filled 2014-04-02: qty 1

## 2014-04-02 MED ORDER — METHYLPREDNISOLONE SODIUM SUCC 125 MG IJ SOLR
125.0000 mg | Freq: Once | INTRAMUSCULAR | Status: AC
  Administered 2014-04-02: 125 mg via INTRAVENOUS
  Filled 2014-04-02: qty 2

## 2014-04-02 MED ORDER — IOHEXOL 300 MG/ML  SOLN
80.0000 mL | Freq: Once | INTRAMUSCULAR | Status: AC | PRN
  Administered 2014-04-02: 80 mL via INTRAVENOUS

## 2014-04-02 MED ORDER — PANTOPRAZOLE SODIUM 40 MG PO TBEC
40.0000 mg | DELAYED_RELEASE_TABLET | Freq: Two times a day (BID) | ORAL | Status: DC
  Administered 2014-04-02 – 2014-04-08 (×12): 40 mg via ORAL
  Filled 2014-04-02 (×14): qty 1

## 2014-04-02 MED ORDER — VANCOMYCIN HCL 500 MG IV SOLR
500.0000 mg | Freq: Two times a day (BID) | INTRAVENOUS | Status: DC
  Administered 2014-04-03 – 2014-04-07 (×9): 500 mg via INTRAVENOUS
  Filled 2014-04-02 (×11): qty 500

## 2014-04-02 MED ORDER — POTASSIUM CHLORIDE 10 MEQ/100ML IV SOLN
10.0000 meq | INTRAVENOUS | Status: AC
  Administered 2014-04-02 (×3): 10 meq via INTRAVENOUS
  Filled 2014-04-02 (×4): qty 100

## 2014-04-02 MED ORDER — ACETAMINOPHEN 650 MG RE SUPP: 650.0000 mg | Freq: Four times a day (QID) | RECTAL | Status: DC | PRN

## 2014-04-02 MED ORDER — BACID PO TABS
2.0000 | ORAL_TABLET | Freq: Two times a day (BID) | ORAL | Status: DC
  Filled 2014-04-02 (×4): qty 2

## 2014-04-02 MED ORDER — POLYVINYL ALCOHOL 1.4 % OP SOLN
1.0000 [drp] | OPHTHALMIC | Status: DC | PRN
  Filled 2014-04-02: qty 15

## 2014-04-02 MED ORDER — ONDANSETRON HCL 4 MG PO TABS: 4.0000 mg | ORAL_TABLET | Freq: Four times a day (QID) | ORAL | Status: DC | PRN

## 2014-04-02 MED ORDER — ONDANSETRON HCL 4 MG/2ML IJ SOLN
4.0000 mg | Freq: Four times a day (QID) | INTRAMUSCULAR | Status: DC | PRN
  Administered 2014-04-03: 4 mg via INTRAVENOUS
  Filled 2014-04-02: qty 2

## 2014-04-02 MED ORDER — POTASSIUM CHLORIDE CRYS ER 20 MEQ PO TBCR
40.0000 meq | EXTENDED_RELEASE_TABLET | Freq: Once | ORAL | Status: AC
  Administered 2014-04-02: 40 meq via ORAL
  Filled 2014-04-02: qty 2

## 2014-04-02 MED ORDER — SODIUM CHLORIDE 0.9 % IV SOLN: INTRAVENOUS | Status: AC

## 2014-04-02 MED ORDER — PREDNISONE 50 MG PO TABS
60.0000 mg | ORAL_TABLET | Freq: Every day | ORAL | Status: DC
  Administered 2014-04-03 – 2014-04-08 (×6): 60 mg via ORAL
  Filled 2014-04-02 (×8): qty 1

## 2014-04-02 MED ORDER — ACETAMINOPHEN 325 MG PO TABS
650.0000 mg | ORAL_TABLET | Freq: Four times a day (QID) | ORAL | Status: DC | PRN
  Administered 2014-04-02: 650 mg via ORAL
  Filled 2014-04-02: qty 2

## 2014-04-02 MED ORDER — CARBOXYMETHYLCELLULOSE SODIUM 0.5 % OP SOLN: 1.0000 [drp] | Freq: Every day | OPHTHALMIC | Status: DC | PRN

## 2014-04-02 MED ORDER — DEXTROSE 5 % IV SOLN
2.0000 g | INTRAVENOUS | Status: DC
  Administered 2014-04-03 – 2014-04-06 (×4): 2 g via INTRAVENOUS
  Filled 2014-04-02 (×5): qty 2

## 2014-04-02 MED ORDER — HYDRALAZINE HCL 20 MG/ML IJ SOLN
10.0000 mg | Freq: Three times a day (TID) | INTRAMUSCULAR | Status: DC | PRN
  Administered 2014-04-06 – 2014-04-08 (×2): 10 mg via INTRAVENOUS
  Filled 2014-04-02 (×2): qty 1

## 2014-04-02 MED ORDER — DULOXETINE HCL 60 MG PO CPEP
90.0000 mg | ORAL_CAPSULE | Freq: Every day | ORAL | Status: DC
  Administered 2014-04-03 – 2014-04-08 (×6): 90 mg via ORAL
  Filled 2014-04-02 (×6): qty 1

## 2014-04-02 MED ORDER — LEVOTHYROXINE SODIUM 112 MCG PO TABS
168.0000 ug | ORAL_TABLET | ORAL | Status: DC
  Administered 2014-04-06: 168 ug via ORAL
  Filled 2014-04-02 (×2): qty 1.5

## 2014-04-02 MED ORDER — SODIUM CHLORIDE 0.9 % IV SOLN
INTRAVENOUS | Status: DC
  Administered 2014-04-02 – 2014-04-03 (×3): via INTRAVENOUS

## 2014-04-02 MED ORDER — VITAMIN D (ERGOCALCIFEROL) 1.25 MG (50000 UNIT) PO CAPS
50000.0000 [IU] | ORAL_CAPSULE | ORAL | Status: DC
  Administered 2014-04-06: 50000 [IU] via ORAL
  Filled 2014-04-02: qty 1

## 2014-04-02 MED ORDER — ERGOCALCIFEROL 1.25 MG (50000 UT) PO CAPS: 50000.0000 [IU] | ORAL_CAPSULE | ORAL | Status: DC

## 2014-04-02 MED ORDER — VANCOMYCIN HCL IN DEXTROSE 1-5 GM/200ML-% IV SOLN
1000.0000 mg | INTRAVENOUS | Status: AC
  Administered 2014-04-02: 1000 mg via INTRAVENOUS
  Filled 2014-04-02: qty 200

## 2014-04-02 MED ORDER — HYDROCODONE-ACETAMINOPHEN 5-325 MG PO TABS: 1.0000 | ORAL_TABLET | ORAL | Status: DC | PRN

## 2014-04-02 MED ORDER — GUAIFENESIN-DM 100-10 MG/5ML PO SYRP
5.0000 mL | ORAL_SOLUTION | ORAL | Status: DC | PRN
  Administered 2014-04-02 – 2014-04-03 (×2): 5 mL via ORAL
  Filled 2014-04-02 (×2): qty 10

## 2014-04-02 MED ORDER — LACTINEX PO CHEW
2.0000 | CHEWABLE_TABLET | Freq: Two times a day (BID) | ORAL | Status: DC
  Administered 2014-04-02 – 2014-04-08 (×11): 2 via ORAL
  Filled 2014-04-02 (×14): qty 2

## 2014-04-02 MED ORDER — ENOXAPARIN SODIUM 40 MG/0.4ML ~~LOC~~ SOLN
40.0000 mg | SUBCUTANEOUS | Status: DC
  Administered 2014-04-02 – 2014-04-07 (×6): 40 mg via SUBCUTANEOUS
  Filled 2014-04-02 (×7): qty 0.4

## 2014-04-02 MED ORDER — ALBUTEROL SULFATE (2.5 MG/3ML) 0.083% IN NEBU
2.5000 mg | INHALATION_SOLUTION | RESPIRATORY_TRACT | Status: DC | PRN
Start: 2014-04-02 — End: 2014-04-03
  Administered 2014-04-03: 2.5 mg via RESPIRATORY_TRACT
  Filled 2014-04-02: qty 3

## 2014-04-02 MED ORDER — ATORVASTATIN CALCIUM 40 MG PO TABS
40.0000 mg | ORAL_TABLET | Freq: Every day | ORAL | Status: DC
Start: 2014-04-03 — End: 2014-04-08
  Administered 2014-04-03 – 2014-04-08 (×6): 40 mg via ORAL
  Filled 2014-04-02 (×6): qty 1

## 2014-04-02 NOTE — Progress Notes (Signed)
Utilization Review completed.  Adalberto Metzgar RN CM  

## 2014-04-02 NOTE — ED Provider Notes (Signed)
CSN: [id number]     Arrival date & time 04/02/14  1255 History   First MD Initiated Contact with Patient 04/02/14 1402     Chief Complaint  Patient presents with  . Pneumonia  . Shortness of Breath  . Fever  . Weakness     (Consider location/radiation/quality/duration/timing/severity/associated sxs/prior Treatment) HPI The patient has had a week and a half of cough and worsening shortness of breath. She also reports central chest pain with coughing, also her abdominal muscles aches from coughing. The symptoms are much worse at night.. She's had generalized weakness and fatigue. She's had waxing waning fever. She reports fever yesterday up to 101. The patient has been treated with Levaquin and has gotten no improvement. She was seen at her [facility] 46 office today and sent to the emergency department for further treatment. The cough has been predominantly dry in nature. She denies any swelling of her legs or pain behind the calves. Last travel was in August at which point time she went to [place]. She was well up until a week and a half ago.  Past Medical History  Diagnosis Date  . GERD (gastroesophageal reflux disease)   . Hypercholesteremia   . Hypothyroid   . Hypertension   . Depression   . Osteoporosis   . Anxiety   . Arthritis   . Sinusitis   . Tachycardia   . Sleep apnea     intolerant to CPAP  . Migraine   . Pneumonia    Past Surgical History  Procedure Laterality Date  . Cesarean section  1967  . Combined augmentation mammaplasty and abdominoplasty     Family History  Problem Relation Age of Onset  . Coronary artery disease    . Heart disease Father     died age 39  . Heart disease Sister   . Diabetes Sister   . Heart disease Brother    History  Substance Use Topics  . Smoking status: Never Smoker   . Smokeless tobacco: Never Used  . Alcohol Use: Yes     Comment: rare wine   OB History   Grav Para Term Preterm Abortions TAB SAB Ect Mult Living               Review of Systems 10 Systems reviewed and are negative for acute change except as noted in the HPI.    Allergies  Review of patient's allergies indicates no known allergies.  Home Medications   Prior to Admission medications   Medication Sig Start Date End Date Taking? Authorizing Provider  atorvastatin (LIPITOR) 40 MG tablet Take 1 tablet (40 mg total) by mouth daily. 09/13/13  Yes Elayne Snare, MD  carboxymethylcellulose (REFRESH PLUS) 0.5 % SOLN Place 1 drop into both eyes daily as needed (for dry eyes).   Yes Historical Provider, MD  chlorpheniramine-HYDROcodone (TUSSIONEX) 10-8 MG/5ML LQCR Take 5 mLs by mouth every 12 (twelve) hours as needed for cough.   Yes Historical Provider, MD  DULoxetine (CYMBALTA) 30 MG capsule Take 90 mg by mouth daily.    Yes Historical Provider, MD  ergocalciferol (VITAMIN D2) 50000 UNITS capsule Take 50,000 Units by mouth every Saturday.    Yes Historical Provider, MD  hydrochlorothiazide (MICROZIDE) 12.5 MG capsule Take 12.5 mg by mouth daily.  03/16/13  Yes Historical Provider, MD  levofloxacin (LEVAQUIN) 750 MG tablet Take 1 tablet (750 mg total) by mouth daily. 03/29/14  Yes Orpah Greek, MD  levothyroxine (SYNTHROID, LEVOTHROID) 112 MCG tablet Take 56-112  mcg by mouth daily before breakfast. Also takes extra half of tab on Saturdays only   Yes Historical Provider, MD  LORazepam (ATIVAN) 0.5 MG tablet Take 0.5 mg by mouth 2 (two) times daily as needed.    Yes Historical Provider, MD  losartan (COZAAR) 100 MG tablet Take 100 mg by mouth daily.   Yes Historical Provider, MD  omeprazole (PRILOSEC) 40 MG capsule Take 40 mg by mouth 2 (two) times daily.   Yes Historical Provider, MD  prednisoLONE acetate (PRED FORTE) 1 % ophthalmic suspension Place 1 drop into both eyes at bedtime.   Yes Historical Provider, MD   BP 119/65  Pulse 86  Temp(Src) 98.1 F (36.7 C) (Oral)  Resp 19  SpO2 93% Physical Exam  Constitutional: She is oriented  to person, place, and time. She appears well-developed and well-nourished.  HENT:  Head: Normocephalic and atraumatic.  Eyes: EOM are normal. Pupils are equal, round, and reactive to light.  Neck: Neck supple.  Cardiovascular: Normal rate, regular rhythm, normal heart sounds and intact distal pulses.   Pulmonary/Chest: Effort normal. No respiratory distress. She has wheezes.  Patient starts, cough paroxysm and with deep inspiration. Decreased air flow to the bases.  Abdominal: Soft. Bowel sounds are normal. She exhibits no distension. There is no tenderness.  Musculoskeletal: Normal range of motion. She exhibits no edema.  Neurological: She is alert and oriented to person, place, and time. She has normal strength. Coordination normal. GCS eye subscore is 4. GCS verbal subscore is 5. GCS motor subscore is 6.  Skin: Skin is warm, dry and intact.  Psychiatric: She has a normal mood and affect.    ED Course  Procedures (including critical care time) Labs Review Labs Reviewed  CBC WITH DIFFERENTIAL - Abnormal; Notable for the following:    Neutrophils Relative % 79 (*)    Neutro Abs 7.8 (*)    Lymphocytes Relative 10 (*)    All other components within normal limits  COMPREHENSIVE METABOLIC PANEL - Abnormal; Notable for the following:    Potassium 2.6 (*)    Glucose, Bld 116 (*)    Albumin 3.0 (*)    GFR calc non Af Amer 85 (*)    All other components within normal limits  PRO B NATRIURETIC PEPTIDE - Abnormal; Notable for the following:    Pro B Natriuretic peptide (BNP) 230.6 (*)    All other components within normal limits  CULTURE, BLOOD (ROUTINE X 2)  CULTURE, BLOOD (ROUTINE X 2)  SEDIMENTATION RATE  I-STAT TROPOININ, ED    Imaging Review Dg Chest 2 View  04/02/2014   CLINICAL DATA:  Dry cough for 1 week.  Shortness of breath.  EXAM: CHEST  2 VIEW  COMPARISON:  03/29/2014  FINDINGS: The cardiac silhouette, mediastinal and hilar contours are within normal limits and stable.  There is mild tortuosity of the thoracic aorta. Slightly progressive airspace opacity in the superior segment of the right lower lobe which likely reflects a progressive pneumonia. This also may be accentuated by lower lung volumes. No pleural effusion. No pneumothorax. The bony thorax is intact.  IMPRESSION: Slight worsening superior segment right lower lobe infiltrate.   Electronically Signed   By: Kalman Jewels M.D.   On: 04/02/2014 14:58     EKG Interpretation None      MDM   Final diagnoses:  Community acquired pneumonia  Hypokalemia   Patient has worsening cough and shortness of breath post outpatient treatment. She is increasing fatigue as  well. Patient does have hypokalemia. She will be admitted to the hospital for further management.    Charlesetta Shanks, MD 04/02/14 [phone]

## 2014-04-02 NOTE — ED Notes (Signed)
EKG PERFORMED.

## 2014-04-02 NOTE — H&P (Addendum)
[facility] History and Physical  ERIK NESSEL WOE:[id number] DOB: October 12, 1945 DOA: 04/02/2014  Referring physician: Dr Johnney Killian.  PCP: PROVIDER NOT IN SYSTEM   Chief Complaint: Worsening cough and SOB.   HPI: Elizabeth Walker is a 68 y.o. female with PMH significant for hypothyroidism, HTN, who present with worsening SOB and cough. Patient relates that symptoms started 1 week ago. She was seen at urgent care center and was started on Levaquin. She has been taking Levaquin for 5 days. She presents today with worsening SOB. She relates cough have improve some. She feels chest congestion, heaviness. She continue to have fever. She had a tempeture at 102 last night. Husband notice patient has been more confuse over 24 hour period.  She has been feeling weak. She has had diarrhea on and off for last 3 years. Last episode of diarrhea was 3 days prior to admission.  She relates nausea and vomiting day prior to admission.  Evaluation in the ED: chest x ray: Slight worsening superior segment right lower lobe infiltrate. Potassium at 2.6.     Review of Systems:  Negative, except as per HPI.   Past Medical History  Diagnosis Date  . GERD (gastroesophageal reflux disease)   . Hypercholesteremia   . Hypothyroid   . Hypertension   . Depression   . Osteoporosis   . Anxiety   . Arthritis   . Sinusitis   . Tachycardia   . Sleep apnea     intolerant to CPAP  . Migraine   . Pneumonia    Past Surgical History  Procedure Laterality Date  . Cesarean section  1967  . Combined augmentation mammaplasty and abdominoplasty     Social History:  reports that she has never smoked. She has never used smokeless tobacco. She reports that she drinks alcohol. She reports that she does not use illicit drugs.  No Known Allergies  Family History  Problem Relation Age of Onset  . Coronary artery disease    . Heart disease Father     died age 13  . Heart disease Sister   . Diabetes Sister   . Heart  disease Brother      Prior to Admission medications   Medication Sig Start Date End Date Taking? Authorizing Provider  atorvastatin (LIPITOR) 40 MG tablet Take 1 tablet (40 mg total) by mouth daily. 09/13/13  Yes Elayne Snare, MD  carboxymethylcellulose (REFRESH PLUS) 0.5 % SOLN Place 1 drop into both eyes daily as needed (for dry eyes).   Yes Historical Provider, MD  chlorpheniramine-HYDROcodone (TUSSIONEX) 10-8 MG/5ML LQCR Take 5 mLs by mouth every 12 (twelve) hours as needed for cough.   Yes Historical Provider, MD  DULoxetine (CYMBALTA) 30 MG capsule Take 90 mg by mouth daily.    Yes Historical Provider, MD  ergocalciferol (VITAMIN D2) 50000 UNITS capsule Take 50,000 Units by mouth every Saturday.    Yes Historical Provider, MD  hydrochlorothiazide (MICROZIDE) 12.5 MG capsule Take 12.5 mg by mouth daily.  03/16/13  Yes Historical Provider, MD  levofloxacin (LEVAQUIN) 750 MG tablet Take 1 tablet (750 mg total) by mouth daily. 03/29/14  Yes Orpah Greek, MD  levothyroxine (SYNTHROID, LEVOTHROID) 112 MCG tablet Take 56-112 mcg by mouth daily before breakfast. Also takes extra half of tab on Saturdays only   Yes Historical Provider, MD  LORazepam (ATIVAN) 0.5 MG tablet Take 0.5 mg by mouth 2 (two) times daily as needed.    Yes Historical Provider, MD  losartan (COZAAR) 100  MG tablet Take 100 mg by mouth daily.   Yes Historical Provider, MD  omeprazole (PRILOSEC) 40 MG capsule Take 40 mg by mouth 2 (two) times daily.   Yes Historical Provider, MD  prednisoLONE acetate (PRED FORTE) 1 % ophthalmic suspension Place 1 drop into both eyes at bedtime.   Yes Historical Provider, MD   Physical Exam: Filed Vitals:   04/02/14 1256 04/02/14 1302 04/02/14 1500  BP:  143/73 119/65  Pulse:  88 86  Temp:  98.1 F (36.7 C)   TempSrc:  Oral   Resp:  16 19  SpO2: 98% 96% 93%    Wt Readings from Last 3 Encounters:  03/29/14 50.803 kg (112 lb)  04/18/13 51.393 kg (113 lb 4.8 oz)  08/30/11 52.672 kg  (116 lb 1.9 oz)    General:  Appears calm and comfortable Eyes: PERRL, normal lids, irises & conjunctiva ENT: grossly normal hearing, lips & tongue Neck: no LAD, masses or thyromegaly Cardiovascular: RRR, no m/r/g. No LE edema. Telemetry: SR, no arrhythmias  Respiratory:  Normal respiratory effort. Bilateral crackles. Sporadic ronchus, wheezing.  Abdomen: soft, ntnd Skin: no rash or induration seen on limited exam Musculoskeletal: grossly normal tone BUE/BLE Psychiatric: grossly normal mood and affect, speech fluent and appropriate Neurologic: grossly non-focal.          Labs on Admission:  Basic Metabolic Panel:  Recent Labs Lab 03/29/14 1227 04/02/14 1250  NA 139 139  K 3.5* 2.6*  CL 97 97  CO2 30 28  GLUCOSE 122* 116*  BUN 12 14  CREATININE 0.64 0.73  CALCIUM 9.3 8.8   Liver Function Tests:  Recent Labs Lab 03/29/14 1227 04/02/14 1250  AST 20 16  ALT 26 17  ALKPHOS 93 81  BILITOT 0.8 0.5  PROT 7.6 6.8  ALBUMIN 3.4* 3.0*    Recent Labs Lab 03/29/14 1227  LIPASE 21   No results found for this basename: AMMONIA,  in the last 168 hours CBC:  Recent Labs Lab 03/29/14 1227 04/02/14 1250  WBC 9.7 9.8  NEUTROABS 8.5* 7.8*  HGB 14.3 12.6  HCT 42.7 37.6  MCV 83.4 83.0  PLT 211 263   Cardiac Enzymes: No results found for this basename: CKTOTAL, CKMB, CKMBINDEX, TROPONINI,  in the last 168 hours  BNP (last 3 results)  Recent Labs  04/02/14 1422  PROBNP 230.6*   CBG: No results found for this basename: GLUCAP,  in the last 168 hours  Radiological Exams on Admission: Dg Chest 2 View  04/02/2014   CLINICAL DATA:  Dry cough for 1 week.  Shortness of breath.  EXAM: CHEST  2 VIEW  COMPARISON:  03/29/2014  FINDINGS: The cardiac silhouette, mediastinal and hilar contours are within normal limits and stable. There is mild tortuosity of the thoracic aorta. Slightly progressive airspace opacity in the superior segment of the right lower lobe which likely  reflects a progressive pneumonia. This also may be accentuated by lower lung volumes. No pleural effusion. No pneumothorax. The bony thorax is intact.  IMPRESSION: Slight worsening superior segment right lower lobe infiltrate.   Electronically Signed   By: Kalman Jewels M.D.   On: 04/02/2014 14:58    EKG: Independently reviewed. Sinus rhythm, minimal ST depression anterior lead.   Assessment/Plan Active Problems:   Essential hypertension   Hypothyroidism   PNA (pneumonia)   Hypokalemia   1-PNA; Patient presents with worsening SOB. continue to have fever. Chest x ray with worsening infiltrates. Fail Levaquin.  -Admit to telemetry,  IV fluids.  -IV Vancomycin and cefepime pharmacy to dose.  -Will check a CT chest with contrast to better evaluate.  -Blood culture ordered.  -ESR at 73. Follow Ct results.   2-Asthma, mild exacerbation;  -Received one dose of IV solumedrol. I will continue with oral prednisone, albuterol schedule.   3-Hypokalemia; - in setting of vomiting and diarrhea. Replete oral and IV. Repeat B-met tonight. Check mg level.   4-Dehydration: - IV fluids.   5-Diarrhea, vomiting.  -IV fluids.  -Check GI pathogen.  -start probiotics.   6-HTN; hold diuretics. PRN hydralazine.  7-Dyspnea; probably related to PNA. Follow CT. BNP not significant elevated. Cycle enzymes.   Code Status: Full Code.  DVT Prophylaxis: SCD.  Family Communication: Care discussed with husband who was at bedside.  Disposition Plan: expect 2 to 3 days depending on clinical condition.   Time spent: 75 minutes.   Niel Hummer A [facility] Pager [phone]

## 2014-04-02 NOTE — ED Notes (Signed)
Per [facility]- Pt referred my [facility]. Pt has been treated for pneumonia DX on Friday. RX given. Pt continues to get worse. Pt c/o of increased SOB without CP. Generalized weakness and fever x 1 week. Sent here for further evaluation.

## 2014-04-02 NOTE — ED Notes (Signed)
Hospitalist at bedside still. Will administer meds when MD finished with assessment.

## 2014-04-02 NOTE — Progress Notes (Signed)
Report from [place] in ED.

## 2014-04-02 NOTE — Progress Notes (Addendum)
ANTIBIOTIC CONSULT NOTE - INITIAL  Pharmacy Consult for Cefepime & Vancomycin Indication: pneumonia  No Known Allergies  Patient Measurements: Height: 5' 0.63" (154 cm) Weight: 111 lb 15.9 oz (50.8 kg) IBW/kg (Calculated) : 46.95  Vital Signs: Temp: 98.1 F (36.7 C) (10/20 1302) Temp Source: Oral (10/20 1302) BP: 119/65 mmHg (10/20 1500) Pulse Rate: 86 (10/20 1500) Intake/Output from previous day:   Intake/Output from this shift:    Labs:  Recent Labs  04/02/14 1250  WBC 9.8  HGB 12.6  PLT 263  CREATININE 0.73   Estimated Creatinine Clearance: 48.4 ml/min (by C-G formula based on Cr of 0.73). No results found for this basename: VANCOTROUGH, VANCOPEAK, VANCORANDOM, GENTTROUGH, GENTPEAK, GENTRANDOM, TOBRATROUGH, TOBRAPEAK, TOBRARND, AMIKACINPEAK, AMIKACINTROU, AMIKACIN,  in the last 72 hours   Microbiology: No results found for this or any previous visit (from the past 720 hour(s)).  Medical History: Past Medical History  Diagnosis Date  . GERD (gastroesophageal reflux disease)   . Hypercholesteremia   . Hypothyroid   . Hypertension   . Depression   . Osteoporosis   . Anxiety   . Arthritis   . Sinusitis   . Tachycardia   . Sleep apnea     intolerant to CPAP  . Migraine   . Pneumonia     Medications:  Scheduled:  . methylPREDNISolone (SOLU-MEDROL) injection  125 mg Intravenous Once   Infusions:  . potassium chloride     Assessment:  68 yr female on Levaquin PTA for pneumonia.  Patients presents with worsening shortness of break and cough.  Chest xray shows worsening right lower lobe infiltrate  Pharmacy consulted to dose Cefepime and Vancomycin for pneumonia  CrCl ~ 48 ml/min  Blood cultures ordered  Goal of Therapy:  Eradication of infection Vancomycin Trough : 15-20 mcg/ml  Plan:  Cefepime 2gm IV q24h Vancomycin 1gm IV x 1 then 500mg  IV q12h Follow renal function Follow cultures and sensitivities  Promiss Labarbera, Toribio Harbour,  PharmD 04/02/2014,3:55 PM

## 2014-04-02 NOTE — ED Notes (Signed)
Hospitalist at bedside 

## 2014-04-02 NOTE — Progress Notes (Signed)
Pt arrived to unit on stretcher, amb w/ steady gait to bed. VSS. A&Ox4. Oriented to callbell and environment. POC discussed.

## 2014-04-02 NOTE — ED Notes (Signed)
Attempted to call report RN with another patient. She will callback in 5 min

## 2014-04-02 NOTE — ED Notes (Signed)
DOCUMENTATION CORRECTION 93%

## 2014-04-02 NOTE — ED Notes (Signed)
Bed: VE93 Expected date:  Expected time:  Means of arrival:  Comments: ems- 68 yo, has PNA, cough fever

## 2014-04-02 NOTE — Progress Notes (Signed)
Called ED for report, on hold x 5 minutes. Will call back shortly.

## 2014-04-02 NOTE — ED Notes (Signed)
Patient transported to X-ray 

## 2014-04-02 NOTE — ED Notes (Signed)
MD at bedside. 

## 2014-04-03 DIAGNOSIS — J45901 Unspecified asthma with (acute) exacerbation: Secondary | ICD-10-CM

## 2014-04-03 LAB — BASIC METABOLIC PANEL
ANION GAP: 14 (ref 5–15)
BUN: 16 mg/dL (ref 6–23)
CHLORIDE: 108 meq/L (ref 96–112)
CO2: 23 meq/L (ref 19–32)
Calcium: 8.4 mg/dL (ref 8.4–10.5)
Creatinine, Ser: 0.7 mg/dL (ref 0.50–1.10)
GFR calc non Af Amer: 86 mL/min — ABNORMAL LOW (ref >90)
Glucose, Bld: 147 mg/dL — ABNORMAL HIGH (ref 70–99)
POTASSIUM: 3.7 meq/L (ref 3.7–5.3)
Sodium: 145 mEq/L (ref 137–147)

## 2014-04-03 LAB — CBC
HEMATOCRIT: 33.7 % — AB (ref 36.0–46.0)
HEMOGLOBIN: 11.4 g/dL — AB (ref 12.0–15.0)
MCH: 28.1 pg (ref 26.0–34.0)
MCHC: 33.8 g/dL (ref 30.0–36.0)
MCV: 83 fL (ref 78.0–100.0)
Platelets: 287 10*3/uL (ref 150–400)
RBC: 4.06 MIL/uL (ref 3.87–5.11)
RDW: 13.1 % (ref 11.5–15.5)
WBC: 7.9 10*3/uL (ref 4.0–10.5)

## 2014-04-03 LAB — STREP PNEUMONIAE URINARY ANTIGEN: STREP PNEUMO URINARY ANTIGEN: NEGATIVE

## 2014-04-03 LAB — TROPONIN I: Troponin I: <0.3 ng/mL (ref <0.30)

## 2014-04-03 MED ORDER — GUAIFENESIN-CODEINE 100-10 MG/5ML PO SOLN
5.0000 mL | ORAL | Status: DC | PRN
  Administered 2014-04-03 – 2014-04-05 (×7): 5 mL via ORAL
  Filled 2014-04-03 (×7): qty 5

## 2014-04-03 MED ORDER — ALBUTEROL SULFATE (2.5 MG/3ML) 0.083% IN NEBU
2.5000 mg | INHALATION_SOLUTION | Freq: Three times a day (TID) | RESPIRATORY_TRACT | Status: DC
  Administered 2014-04-03: 2.5 mg via RESPIRATORY_TRACT
  Filled 2014-04-03: qty 3

## 2014-04-03 MED ORDER — BENZONATATE 100 MG PO CAPS
100.0000 mg | ORAL_CAPSULE | Freq: Three times a day (TID) | ORAL | Status: DC
  Administered 2014-04-03 – 2014-04-04 (×4): 100 mg via ORAL
  Filled 2014-04-03 (×5): qty 1

## 2014-04-03 MED ORDER — HYDROCODONE-ACETAMINOPHEN 5-325 MG PO TABS
1.0000 | ORAL_TABLET | ORAL | Status: DC | PRN
  Administered 2014-04-04 – 2014-04-06 (×2): 1 via ORAL
  Administered 2014-04-06 – 2014-04-07 (×2): 2 via ORAL
  Administered 2014-04-07 (×2): 1 via ORAL
  Filled 2014-04-03 (×2): qty 2
  Filled 2014-04-03 (×4): qty 1

## 2014-04-03 MED ORDER — IPRATROPIUM-ALBUTEROL 0.5-2.5 (3) MG/3ML IN SOLN
3.0000 mL | Freq: Four times a day (QID) | RESPIRATORY_TRACT | Status: DC
  Administered 2014-04-03 – 2014-04-07 (×15): 3 mL via RESPIRATORY_TRACT
  Filled 2014-04-03 (×16): qty 3

## 2014-04-03 NOTE — Progress Notes (Signed)
Pt states she does not want to wear CPAP at all tonight.  Pt encouraged to notify RN or RT if she changes her mind and RT will assist her with putting the CPAP on.

## 2014-04-03 NOTE — Progress Notes (Signed)
[facility] PROGRESS NOTE  Elizabeth Walker VHQ:[id number] DOB: 1945/10/14 DOA: 04/02/2014 PCP: PROVIDER NOT IN SYSTEM  Assessment/Plan  PNA; Patient presents with worsening SOB. continue to have fever. Chest x ray with worsening infiltrates. Possible failed Levaquin or may have fungal or viral infection instead -  Tele:  NSR -  troponins negative -  Continue IV Vancomycin and cefepime -  CT confirms superior RLL PNA -  Legionella and S. pneumo ag -  resp viral panel -  BCx pending  Asthma vs. COPD from secondhand smoke exposure, mild exacerbation;  -  continue oral prednisone -  Start duonebs instead of albuterol monotherapy and schedule -  abx as above -  Increase cough suppressant to include codeine  Hypokalemia, resolved with supplemention  4-Dehydration, resolved with IVF  5-Diarrhea, vomiting, slowing some.  Has been a chronic problem -  Will need f/u with GI  -IV fluids.  -Check GI pathogen.  -continue probiotics.   6-HTN, BP stable  -  hold diuretics. -   PRN hydralazine.   Diet:  regular Access:  PIV IVF:  off Proph:  lovenox  Code Status: full Family Communication: patient alone Disposition Plan: pending improvement in cough/breathing and diarrhea slowing.     Consultants:  [facility]  Procedures:  CT chest  Antibiotics:  vanc 10/20 >>  Cefepime 10/20 >>   HPI/Subjective:  Severe persistent cough.  Diarrhea less frequent today.    Objective: Filed Vitals:   04/03/14 0238 04/03/14 0435 04/03/14 0915 04/03/14 1519  BP:  127/63  130/61  Pulse:  102  107  Temp:  98.2 F (36.8 C)  98.4 F (36.9 C)  TempSrc:  Oral  Oral  Resp:  16  15  Height:      Weight:  53.388 kg (117 lb 11.2 oz)    SpO2: 93% 93% 100% 97%    Intake/Output Summary (Last 24 hours) at 04/03/14 1957 Last data filed at 04/03/14 1520  Gross per 24 hour  Intake    240 ml  Output      0 ml  Net    240 ml   Filed Weights   04/02/14 1551 04/03/14 0435  Weight: 50.8 kg  (111 lb 15.9 oz) 53.388 kg (117 lb 11.2 oz)    Exam:   General:  WF, frequent cough when giving ROS, No acute distress  HEENT:  NCAT, MMM  Cardiovascular:  RRR, nl S1, S2 no mrg, 2+ pulses, warm extremities  Respiratory:  Diminished BS at the right base, faint scattered rales throughout, no obvious wheezing, no increased WOB  Abdomen:   NABS, soft, NT/ND  MSK:   Normal tone and bulk, no LEE  Neuro:  Grossly intact  Data Reviewed: Basic Metabolic Panel:  Recent Labs Lab 03/29/14 1227 04/02/14 1250 04/02/14 1652 04/02/14 1903 04/03/14 0510  NA 139 139  --  140 145  K 3.5* 2.6*  --  3.9 3.7  CL 97 97  --  102 108  CO2 30 28  --  25 23  GLUCOSE 122* 116*  --  151* 147*  BUN 12 14  --  10 16  CREATININE 0.64 0.73  --  0.63 0.70  CALCIUM 9.3 8.8  --  8.2* 8.4  MG  --   --  1.9  --   --    Liver Function Tests:  Recent Labs Lab 03/29/14 1227 04/02/14 1250  AST 20 16  ALT 26 17  ALKPHOS 93 81  BILITOT 0.8  0.5  PROT 7.6 6.8  ALBUMIN 3.4* 3.0*    Recent Labs Lab 03/29/14 1227  LIPASE 21   No results found for this basename: AMMONIA,  in the last 168 hours CBC:  Recent Labs Lab 03/29/14 1227 04/02/14 1250 04/03/14 0510  WBC 9.7 9.8 7.9  NEUTROABS 8.5* 7.8*  --   HGB 14.3 12.6 11.4*  HCT 42.7 37.6 33.7*  MCV 83.4 83.0 83.0  PLT 211 263 287   Cardiac Enzymes:  Recent Labs Lab 04/02/14 1652 04/02/14 2233 04/03/14 0510  TROPONINI <0.30 <0.30 <0.30   BNP (last 3 results)  Recent Labs  04/02/14 1422  PROBNP 230.6*   CBG: No results found for this basename: GLUCAP,  in the last 168 hours  Recent Results (from the past 240 hour(s))  CULTURE, BLOOD (ROUTINE X 2)     Status: None   Collection Time    04/02/14  4:05 PM      Result Value Ref Range Status   Specimen Description BLOOD LEFT ANTECUBITAL   Final   Special Requests BOTTLES DRAWN AEROBIC AND ANAEROBIC 5ML   Final   Culture  Setup Time     Final   Value: 04/02/2014 21:22      Performed at [facility]   Culture     Final   Value:        BLOOD CULTURE RECEIVED NO GROWTH TO DATE CULTURE WILL BE HELD FOR 5 DAYS BEFORE ISSUING A FINAL NEGATIVE REPORT     Performed at [facility]   Report Status PENDING   Incomplete  CULTURE, BLOOD (ROUTINE X 2)     Status: None   Collection Time    04/02/14  4:08 PM      Result Value Ref Range Status   Specimen Description BLOOD RIGHT WRIST   Final   Special Requests BOTTLES DRAWN AEROBIC ONLY 4ML   Final   Culture  Setup Time     Final   Value: 04/02/2014 21:22     Performed at [facility]   Culture     Final   Value:        BLOOD CULTURE RECEIVED NO GROWTH TO DATE CULTURE WILL BE HELD FOR 5 DAYS BEFORE ISSUING A FINAL NEGATIVE REPORT     Performed at [facility]   Report Status PENDING   Incomplete     Studies: Dg Chest 2 View  04/02/2014   CLINICAL DATA:  Dry cough for 1 week.  Shortness of breath.  EXAM: CHEST  2 VIEW  COMPARISON:  03/29/2014  FINDINGS: The cardiac silhouette, mediastinal and hilar contours are within normal limits and stable. There is mild tortuosity of the thoracic aorta. Slightly progressive airspace opacity in the superior segment of the right lower lobe which likely reflects a progressive pneumonia. This also may be accentuated by lower lung volumes. No pleural effusion. No pneumothorax. The bony thorax is intact.  IMPRESSION: Slight worsening superior segment right lower lobe infiltrate.   Electronically Signed   By: Kalman Jewels M.D.   On: 04/02/2014 14:58   Ct Chest W Contrast  04/02/2014   CLINICAL DATA:  Cough with worsening shortness of breath for 1-1/2 weeks. Chest pain with coughing. Intermittent fevers.  EXAM: CT CHEST WITH CONTRAST  TECHNIQUE: Multidetector CT imaging of the chest was performed during intravenous contrast administration.  CONTRAST:  80 mL OMNIPAQUE IOHEXOL 300 MG/ML  SOLN  COMPARISON:  PA and lateral chest 04/02/2014.  FINDINGS: Calcified  bilateral breast implants are identified. There is no axillary or supraclavicular lymphadenopathy. No mediastinal lymphadenopathy is seen. A right hilar node measuring 1.2 cm is seen on image 22. Additional small right hilar lymph nodes are noted. There is no pleural or pericardial effusion.  The lungs demonstrate airspace disease in the superior segment of the right lower lobe with an appearance most consistent with pneumonia. The lungs otherwise are clear.  Imaged upper abdomen demonstrates no focal abnormality. No focal bony abnormality is seen.  IMPRESSION: Airspace disease in the superior segment of right lower lobe most consistent with pneumonia. Small right hilar lymph nodes are consistent with reactive change. Recommend followup plain films to clearing.   Electronically Signed   By: Inge Rise M.D.   On: 04/02/2014 17:28    Scheduled Meds: . atorvastatin  40 mg Oral Daily  . benzonatate  100 mg Oral TID  . ceFEPime (MAXIPIME) IV  2 g Intravenous Q24H  . DULoxetine  90 mg Oral Daily  . enoxaparin (LOVENOX) injection  40 mg Subcutaneous Q24H  . ipratropium-albuterol  3 mL Nebulization QID  . lactobacillus acidophilus & bulgar  2 tablet Oral BID  . lactobacillus acidophilus  2 tablet Oral BID  . levothyroxine  112 mcg Oral Once per day on Sun Mon Tue Wed Thu Fri  . [START ON 04/06/2014] levothyroxine  168 mcg Oral Q7 days  . pantoprazole  40 mg Oral BID  . prednisoLONE acetate  1 drop Both Eyes QHS  . predniSONE  60 mg Oral Q breakfast  . sodium chloride  3 mL Intravenous Q12H  . vancomycin  500 mg Intravenous Q12H  . [START ON 04/06/2014] Vitamin D (Ergocalciferol)  50,000 Units Oral Q Sat   Continuous Infusions: . sodium chloride 100 mL/hr at 04/03/14 0045    Active Problems:   Essential hypertension   Hypothyroidism   PNA (pneumonia)   Hypokalemia   Pneumonia    Time spent: 30 min    Corrigan Kretschmer, [facility] Pager [phone]. If 7PM-7AM, please contact  night-coverage at [email or web address], password [facility] 04/03/2014, 7:57 PM  LOS: 1 day

## 2014-04-04 LAB — LEGIONELLA ANTIGEN, URINE

## 2014-04-04 LAB — GI PATHOGEN PANEL BY PCR, STOOL
C difficile toxin A/B: NEGATIVE
Campylobacter by PCR: NEGATIVE
Cryptosporidium by PCR: NEGATIVE
E COLI (STEC): NEGATIVE
E coli (ETEC) LT/ST: NEGATIVE
E coli 0157 by PCR: NEGATIVE
G lamblia by PCR: NEGATIVE
Norovirus GI/GII: NEGATIVE
ROTAVIRUS A BY PCR: NEGATIVE
SALMONELLA BY PCR: NEGATIVE
SHIGELLA BY PCR: NEGATIVE

## 2014-04-04 MED ORDER — MOMETASONE FURO-FORMOTEROL FUM 100-5 MCG/ACT IN AERO
2.0000 | INHALATION_SPRAY | Freq: Two times a day (BID) | RESPIRATORY_TRACT | Status: DC
Start: 2014-04-04 — End: 2014-04-06
  Administered 2014-04-04 – 2014-04-06 (×4): 2 via RESPIRATORY_TRACT
  Filled 2014-04-04: qty 8.8

## 2014-04-04 MED ORDER — MENTHOL 3 MG MT LOZG
1.0000 | LOZENGE | OROMUCOSAL | Status: DC | PRN
  Administered 2014-04-04: 3 mg via ORAL
  Filled 2014-04-04: qty 9

## 2014-04-04 MED ORDER — BENZONATATE 100 MG PO CAPS
200.0000 mg | ORAL_CAPSULE | Freq: Three times a day (TID) | ORAL | Status: DC
  Administered 2014-04-04 – 2014-04-08 (×12): 200 mg via ORAL
  Filled 2014-04-04 (×13): qty 2

## 2014-04-04 NOTE — Progress Notes (Signed)
[facility] PROGRESS NOTE  Elizabeth Walker ERX:[id number] DOB: [birth year] DOA: 04/02/2014 PCP: PROVIDER NOT IN SYSTEM  Assessment/Plan  PNA; Patient presents with worsening SOB. continue to have fever. Chest x ray with worsening infiltrates. Possible failed Levaquin or may have fungal or viral infection instead -  Tele:  NSR -  troponins negative -  Continue IV Vancomycin and cefepime -  CT confirms superior RLL PNA -  Legionella and S. pneumo ag negative -  BCx NGTD  Asthma vs. COPD from secondhand smoke exposure, mild exacerbation;  -  continue oral prednisone -  Continue duonebs -  abx as above -  cough suppressant to include codeine -  Start dulera -  Continue PPI -  Not on ACEI  Hypokalemia, resolved with supplemention  Dehydration, resolved with IVF  Diarrhea, vomiting, slowing some.  Has been a chronic problem -  Will need f/u with GI  - GI pathogen panel pending - continue probiotics.   HTN, BP low normal -  hold diuretics. -   PRN hydralazine.   Diet:  regular Access:  PIV IVF:  off Proph:  lovenox  Code Status: full Family Communication: patient alone Disposition Plan: pending improvement in cough/breathing and diarrhea slowing.     Consultants:  [facility]One  Procedures:  CT chest  Antibiotics:  vanc 10/20 >>  Cefepime 10/20 >>   HPI/Subjective:  Severe persistent cough.  Diarrhea less frequent today.    Objective: Filed Vitals:   04/04/14 0639 04/04/14 0811 04/04/14 1149 04/04/14 1359  BP: 120/52   125/53  Pulse: 64   71  Temp: 97.6 F (36.4 C)   98.2 F (36.8 C)  TempSrc: Oral   Axillary  Resp: 16   18  Height:      Weight: 54.341 kg (119 lb 12.8 oz)     SpO2: 94% 98% 100% 100%    Intake/Output Summary (Last 24 hours) at 04/04/14 1903 Last data filed at 04/04/14 0600  Gross per 24 hour  Intake    250 ml  Output    650 ml  Net   -400 ml   Filed Weights   04/02/14 1551 04/03/14 0435 04/04/14 0639  Weight: 50.8 kg (111 lb  15.9 oz) 53.388 kg (117 lb 11.2 oz) 54.341 kg (119 lb 12.8 oz)    Exam:   General:  WF, frequent cough when giving ROS, No acute distress  HEENT:  NCAT, MMM  Cardiovascular:  RRR, nl S1, S2 no mrg, 2+ pulses, warm extremities  Respiratory:  Diminished BS at the right base, faint scattered rales throughout, no obvious wheezing, no increased WOB  Abdomen:   NABS, soft, NT/ND  MSK:   Normal tone and bulk, no LEE  Neuro:  Grossly intact  Data Reviewed: Basic Metabolic Panel:  Recent Labs Lab 03/29/14 1227 04/02/14 1250 04/02/14 1652 04/02/14 1903 04/03/14 0510  NA 139 139  --  140 145  K 3.5* 2.6*  --  3.9 3.7  CL 97 97  --  102 108  CO2 30 28  --  25 23  GLUCOSE 122* 116*  --  151* 147*  BUN 12 14  --  10 16  CREATININE 0.64 0.73  --  0.63 0.70  CALCIUM 9.3 8.8  --  8.2* 8.4  MG  --   --  1.9  --   --    Liver Function Tests:  Recent Labs Lab 03/29/14 1227 04/02/14 1250  AST 20 16  ALT 26 17  ALKPHOS 93 81  BILITOT 0.8 0.5  PROT 7.6 6.8  ALBUMIN 3.4* 3.0*    Recent Labs Lab 03/29/14 1227  LIPASE 21   No results found for this basename: AMMONIA,  in the last 168 hours CBC:  Recent Labs Lab 03/29/14 1227 04/02/14 1250 04/03/14 0510  WBC 9.7 9.8 7.9  NEUTROABS 8.5* 7.8*  --   HGB 14.3 12.6 11.4*  HCT 42.7 37.6 33.7*  MCV 83.4 83.0 83.0  PLT 211 263 287   Cardiac Enzymes:  Recent Labs Lab 04/02/14 1652 04/02/14 2233 04/03/14 0510  TROPONINI <0.30 <0.30 <0.30   BNP (last 3 results)  Recent Labs  04/02/14 1422  PROBNP 230.6*   CBG: No results found for this basename: GLUCAP,  in the last 168 hours  Recent Results (from the past 240 hour(s))  CULTURE, BLOOD (ROUTINE X 2)     Status: None   Collection Time    04/02/14  4:05 PM      Result Value Ref Range Status   Specimen Description BLOOD LEFT ANTECUBITAL   Final   Special Requests BOTTLES DRAWN AEROBIC AND ANAEROBIC 5ML   Final   Culture  Setup Time     Final   Value:  04/02/2014 21:22     Performed at [facility]   Culture     Final   Value:        BLOOD CULTURE RECEIVED NO GROWTH TO DATE CULTURE WILL BE HELD FOR 5 DAYS BEFORE ISSUING A FINAL NEGATIVE REPORT     Performed at [facility]   Report Status PENDING   Incomplete  CULTURE, BLOOD (ROUTINE X 2)     Status: None   Collection Time    04/02/14  4:08 PM      Result Value Ref Range Status   Specimen Description BLOOD RIGHT WRIST   Final   Special Requests BOTTLES DRAWN AEROBIC ONLY 4ML   Final   Culture  Setup Time     Final   Value: 04/02/2014 21:22     Performed at [facility]   Culture     Final   Value:        BLOOD CULTURE RECEIVED NO GROWTH TO DATE CULTURE WILL BE HELD FOR 5 DAYS BEFORE ISSUING A FINAL NEGATIVE REPORT     Performed at [facility]   Report Status PENDING   Incomplete     Studies: No results found.  Scheduled Meds: . atorvastatin  40 mg Oral Daily  . benzonatate  100 mg Oral TID  . ceFEPime (MAXIPIME) IV  2 g Intravenous Q24H  . DULoxetine  90 mg Oral Daily  . enoxaparin (LOVENOX) injection  40 mg Subcutaneous Q24H  . ipratropium-albuterol  3 mL Nebulization QID  . lactobacillus acidophilus & bulgar  2 tablet Oral BID  . levothyroxine  112 mcg Oral Once per day on Sun Mon Tue Wed Thu Fri  . [START ON 04/06/2014] levothyroxine  168 mcg Oral Q7 days  . pantoprazole  40 mg Oral BID  . prednisoLONE acetate  1 drop Both Eyes QHS  . predniSONE  60 mg Oral Q breakfast  . sodium chloride  3 mL Intravenous Q12H  . vancomycin  500 mg Intravenous Q12H  . [START ON 04/06/2014] Vitamin D (Ergocalciferol)  50,000 Units Oral Q Sat   Continuous Infusions:    Active Problems:   Essential hypertension   Hypothyroidism   PNA (pneumonia)   Hypokalemia   Pneumonia  Asthma with acute exacerbation    Time spent: 30 min    Elizabeth Walker, [facility] Pager [phone]. If 7PM-7AM, please contact night-coverage at [email or web address],  password [facility] 04/04/2014, 7:03 PM  LOS: 2 days

## 2014-04-04 NOTE — Progress Notes (Signed)
Pt. Refused cpap for tonight. 

## 2014-04-05 ENCOUNTER — Inpatient Hospital Stay (HOSPITAL_COMMUNITY): Payer: Medicare Other

## 2014-04-05 DIAGNOSIS — J4541 Moderate persistent asthma with (acute) exacerbation: Secondary | ICD-10-CM

## 2014-04-05 MED ORDER — CALCIUM CARBONATE ANTACID 500 MG PO CHEW
1.0000 | CHEWABLE_TABLET | Freq: Three times a day (TID) | ORAL | Status: DC | PRN
  Administered 2014-04-05 (×2): 200 mg via ORAL
  Filled 2014-04-05 (×2): qty 1

## 2014-04-05 MED ORDER — POLYETHYLENE GLYCOL 3350 17 G PO PACK
17.0000 g | PACK | Freq: Every day | ORAL | Status: DC
  Administered 2014-04-05 – 2014-04-08 (×4): 17 g via ORAL
  Filled 2014-04-05 (×4): qty 1

## 2014-04-05 NOTE — Progress Notes (Signed)
[facility] PROGRESS NOTE  Elizabeth Walker OYD:[id number] DOB: 1946/05/02 DOA: 04/02/2014 PCP: PROVIDER NOT IN SYSTEM  Assessment/Plan  CAP which failed outpatient treatment; Patient presents with worsening SOB. continue to have fever. Chest x ray with worsening infiltrates. Possible failed Levaquin or may have fungal or viral infection instead -  Tele:  NSR -  troponins negative -  Continue IV Vancomycin and cefepime -  CT confirms superior RLL PNA -  Legionella and S. pneumo ag negative -  BCx NGTD -  CXR:  Some improvement   Asthma vs. COPD from secondhand smoke exposure, with exacerbation -  continue oral prednisone -  Continue duonebs -  abx as above -  cough suppressant to include codeine -  Continue dulera -  Continue PPI -  Not on ACEI  Hypokalemia, resolved with supplemention  Dehydration, resolved with IVF  Diarrhea, vomiting, slowing some.  Has been a chronic problem -  Will need f/u with GI  - GI pathogen panel pending - continue probiotics.   HTN, BP low normal -  hold diuretics. -   PRN hydralazine.   Diet:  regular Access:  PIV IVF:  off Proph:  lovenox  Code Status: full Family Communication: patient alone Disposition Plan: pending improvement in cough/breathing.  Still having significant SOB   Consultants:  [facility]  Procedures:  CT chest  Antibiotics:  vanc 10/20 >>  Cefepime 10/20 >>   HPI/Subjective:  Severe persistent cough.  Diarrhea less frequent today.    Objective: Filed Vitals:   04/04/14 2017 04/05/14 0514 04/05/14 0609 04/05/14 1435  BP: 141/67 167/72 151/59 145/67  Pulse: 93 71  99  Temp: 98.2 F (36.8 C) 98.3 F (36.8 C)  98.1 F (36.7 C)  TempSrc: Oral Oral  Oral  Resp: 16 16  20   Height:      Weight:      SpO2: 94% 98%  96%    Intake/Output Summary (Last 24 hours) at 04/05/14 1909 Last data filed at 04/05/14 0900  Gross per 24 hour  Intake    510 ml  Output      0 ml  Net    510 ml   Filed Weights    04/02/14 1551 04/03/14 0435 04/04/14 0639  Weight: 50.8 kg (111 lb 15.9 oz) 53.388 kg (117 lb 11.2 oz) 54.341 kg (119 lb 12.8 oz)    Exam:   General:  WF, frequent cough  HEENT:  NCAT, MMM  Cardiovascular:  RRR, nl S1, S2 no mrg, 2+ pulses, warm extremities  Respiratory:  Diminished BS at the right base, + wheeze, no focal rales or rhonchi, no increased WOB  Abdomen:   NABS, soft, NT/ND  MSK:   Normal tone and bulk, no LEE  Neuro:  Grossly intact  Data Reviewed: Basic Metabolic Panel:  Recent Labs Lab 04/02/14 1250 04/02/14 1652 04/02/14 1903 04/03/14 0510  NA 139  --  140 145  K 2.6*  --  3.9 3.7  CL 97  --  102 108  CO2 28  --  25 23  GLUCOSE 116*  --  151* 147*  BUN 14  --  10 16  CREATININE 0.73  --  0.63 0.70  CALCIUM 8.8  --  8.2* 8.4  MG  --  1.9  --   --    Liver Function Tests:  Recent Labs Lab 04/02/14 1250  AST 16  ALT 17  ALKPHOS 81  BILITOT 0.5  PROT 6.8  ALBUMIN 3.0*  No results found for this basename: LIPASE, AMYLASE,  in the last 168 hours No results found for this basename: AMMONIA,  in the last 168 hours CBC:  Recent Labs Lab 04/02/14 1250 04/03/14 0510  WBC 9.8 7.9  NEUTROABS 7.8*  --   HGB 12.6 11.4*  HCT 37.6 33.7*  MCV 83.0 83.0  PLT 263 287   Cardiac Enzymes:  Recent Labs Lab 04/02/14 1652 04/02/14 2233 04/03/14 0510  TROPONINI <0.30 <0.30 <0.30   BNP (last 3 results)  Recent Labs  04/02/14 1422  PROBNP 230.6*   CBG: No results found for this basename: GLUCAP,  in the last 168 hours  Recent Results (from the past 240 hour(s))  CULTURE, BLOOD (ROUTINE X 2)     Status: None   Collection Time    04/02/14  4:05 PM      Result Value Ref Range Status   Specimen Description BLOOD LEFT ANTECUBITAL   Final   Special Requests BOTTLES DRAWN AEROBIC AND ANAEROBIC 5ML   Final   Culture  Setup Time     Final   Value: 04/02/2014 21:22     Performed at [facility]   Culture     Final   Value:         BLOOD CULTURE RECEIVED NO GROWTH TO DATE CULTURE WILL BE HELD FOR 5 DAYS BEFORE ISSUING A FINAL NEGATIVE REPORT     Performed at [facility]   Report Status PENDING   Incomplete  CULTURE, BLOOD (ROUTINE X 2)     Status: None   Collection Time    04/02/14  4:08 PM      Result Value Ref Range Status   Specimen Description BLOOD RIGHT WRIST   Final   Special Requests BOTTLES DRAWN AEROBIC ONLY 4ML   Final   Culture  Setup Time     Final   Value: 04/02/2014 21:22     Performed at [facility]   Culture     Final   Value:        BLOOD CULTURE RECEIVED NO GROWTH TO DATE CULTURE WILL BE HELD FOR 5 DAYS BEFORE ISSUING A FINAL NEGATIVE REPORT     Performed at [facility]   Report Status PENDING   Incomplete     Studies: Dg Chest 2 View  04/05/2014   CLINICAL DATA:  Cough, evaluate for pneumonia. Subsequent encounter.  EXAM: CHEST  2 VIEW  COMPARISON:  04/02/2014; 03/29/2014; chest CT -04/02/2014  FINDINGS: Grossly unchanged cardiac silhouette and mediastinal contours with mild tortuosity of the thoracic aorta. Overall improved aeration of lungs with persistent minimal right mid lung heterogeneous opacities. No new focal airspace opacities. Query trace right-sided effusion. No pneumothorax. No evidence of edema. Unchanged bones. Peripherally calcified breast prostheses.  IMPRESSION: Improved aeration of the lungs with persistent ill-defined right mid lung heterogeneous airspace opacities worrisome for residual infection. A follow-up chest radiograph in 4 to 6 weeks after treatment is recommended to ensure resolution.   Electronically Signed   By: Sandi Mariscal M.D.   On: 04/05/2014 14:02    Scheduled Meds: . atorvastatin  40 mg Oral Daily  . benzonatate  200 mg Oral TID  . ceFEPime (MAXIPIME) IV  2 g Intravenous Q24H  . DULoxetine  90 mg Oral Daily  . enoxaparin (LOVENOX) injection  40 mg Subcutaneous Q24H  . ipratropium-albuterol  3 mL Nebulization QID  . lactobacillus  acidophilus & bulgar  2 tablet Oral BID  .  levothyroxine  112 mcg Oral Once per day on Sun Mon Tue Wed Thu Fri  . [START ON 04/06/2014] levothyroxine  168 mcg Oral Q7 days  . mometasone-formoterol  2 puff Inhalation BID  . pantoprazole  40 mg Oral BID  . polyethylene glycol  17 g Oral Daily  . prednisoLONE acetate  1 drop Both Eyes QHS  . predniSONE  60 mg Oral Q breakfast  . sodium chloride  3 mL Intravenous Q12H  . vancomycin  500 mg Intravenous Q12H  . [START ON 04/06/2014] Vitamin D (Ergocalciferol)  50,000 Units Oral Q Sat   Continuous Infusions:    Active Problems:   Essential hypertension   Hypothyroidism   PNA (pneumonia)   Hypokalemia   Pneumonia   Asthma with acute exacerbation    Time spent: 30 min    Melvine Julin, [facility] Pager [phone]. If 7PM-7AM, please contact night-coverage at [email or web address], password [facility] 04/05/2014, 7:09 PM  LOS: 3 days

## 2014-04-05 NOTE — Progress Notes (Signed)
Pt refuses CPAP at this time. She will call the RT if she wants it later.

## 2014-04-06 MED ORDER — ARFORMOTEROL TARTRATE 15 MCG/2ML IN NEBU
15.0000 ug | INHALATION_SOLUTION | Freq: Two times a day (BID) | RESPIRATORY_TRACT | Status: DC
  Administered 2014-04-06 – 2014-04-07 (×4): 15 ug via RESPIRATORY_TRACT
  Filled 2014-04-06 (×9): qty 2

## 2014-04-06 MED ORDER — FUROSEMIDE 10 MG/ML IJ SOLN
20.0000 mg | Freq: Once | INTRAMUSCULAR | Status: AC
  Administered 2014-04-06: 20 mg via INTRAVENOUS
  Filled 2014-04-06: qty 2

## 2014-04-06 MED ORDER — BUDESONIDE 0.5 MG/2ML IN SUSP
0.5000 mg | Freq: Two times a day (BID) | RESPIRATORY_TRACT | Status: DC
  Administered 2014-04-06 – 2014-04-08 (×5): 0.5 mg via RESPIRATORY_TRACT
  Filled 2014-04-06 (×6): qty 2

## 2014-04-06 MED ORDER — MORPHINE SULFATE (CONCENTRATE) 10 MG /0.5 ML PO SOLN
10.0000 mg | ORAL | Status: DC | PRN
  Administered 2014-04-06: 10 mg via ORAL
  Filled 2014-04-06: qty 0.5

## 2014-04-06 MED ORDER — DOCUSATE SODIUM 100 MG PO CAPS
100.0000 mg | ORAL_CAPSULE | Freq: Two times a day (BID) | ORAL | Status: DC
  Administered 2014-04-06 – 2014-04-08 (×3): 100 mg via ORAL
  Filled 2014-04-06 (×5): qty 1

## 2014-04-06 MED ORDER — BISACODYL 5 MG PO TBEC: 5.0000 mg | DELAYED_RELEASE_TABLET | Freq: Every day | ORAL | Status: DC | PRN

## 2014-04-06 MED ORDER — PHENOL 1.4 % MT LIQD
1.0000 | OROMUCOSAL | Status: DC | PRN
  Administered 2014-04-06: 1 via OROMUCOSAL
  Filled 2014-04-06 (×2): qty 177

## 2014-04-06 MED ORDER — GUAIFENESIN-CODEINE 100-10 MG/5ML PO SOLN: 10.0000 mL | ORAL | Status: DC | PRN

## 2014-04-06 MED ORDER — SENNA 8.6 MG PO TABS
2.0000 | ORAL_TABLET | Freq: Every day | ORAL | Status: DC
  Administered 2014-04-06: 17.2 mg via ORAL
  Filled 2014-04-06: qty 2

## 2014-04-06 NOTE — Progress Notes (Signed)
[facility] PROGRESS NOTE  Elizabeth Walker JJO:[id number] DOB: 1946/04/07 DOA: 04/02/2014 PCP: PROVIDER NOT IN SYSTEM  Assessment/Plan  CAP which failed outpatient treatment; Patient presents with worsening SOB. continue to have fever. Chest x ray with worsening infiltrates, now appearing somewhat improved.  Possible failed Levaquin or may have fungal or viral infection instead -  Tele:  NSR -  troponins negative -  Continue IV Vancomycin and cefepime, day 4 of possibly 5 days  -  CT confirms superior RLL PNA -  Legionella and S. 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  Has been a chronic problem -  Will need f/u with GI  - GI pathogen panel negative - continue probiotics.   HTN, BP low normal -  hold diuretics. -   PRN hydralazine.   Constipation -  Start colace, senna, miralax, and prn bisacodyl   Diet:  regular Access:  PIV IVF:  off Proph:  lovenox  Code Status: full Family Communication: patient alone Disposition Plan: pending improvement in cough/breathing.  Still having significant SOB   Consultants:  [facility]One  Procedures:  CT chest  Antibiotics:  vanc 10/20 >>  Cefepime 10/20 >>   HPI/Subjective:  Severe persistent cough and feeling unwell.  No improvement.    Objective: Filed Vitals:   04/06/14 0300 04/06/14 0445 04/06/14 0915 04/06/14 1216  BP: 169/84     Pulse: 92     Temp: 98.2 F (36.8 C)     TempSrc: Oral     Resp: 20     Height:      Weight:   53.797 kg (118 lb 9.6 oz)    SpO2: 100%  96% 98%    Intake/Output Summary (Last 24 hours) at 04/06/14 1235 Last data filed at 04/05/14 1750  Gross per 24 hour  Intake    150 ml  Output      0 ml  Net    150 ml   Filed Weights   04/03/14 0435 04/04/14 0639 04/06/14 0445  Weight: 53.388 kg (117 lb 11.2 oz) 54.341 kg (119 lb 12.8 oz) 53.797 kg (118 lb 9.6 oz)    Exam:   General:  WF, frequent cough  HEENT:  NCAT, MMM  Cardiovascular:  RRR, nl S1, S2 no mrg, 2+ pulses, warm extremities  Respiratory:  Diminished BS at the right base, wheezy cough, no focal rales or rhonchi, no increased WOB  Abdomen:   NABS, soft, NT/ND  MSK:   Normal tone and bulk, no LEE  Neuro:  Grossly intact  Data Reviewed: Basic Metabolic Panel:  Recent Labs Lab 04/02/14 1250 04/02/14 1652 04/02/14 1903 04/03/14 0510  NA 139  --  140 145  K 2.6*  --  3.9 3.7  CL 97  --  102 108  CO2 28  --  25 23  GLUCOSE 116*  --  151* 147*  BUN 14  --  10 16  CREATININE 0.73  --  0.63 0.70  CALCIUM 8.8  --  8.2* 8.4  MG  --  1.9  --   --    Liver Function Tests:  Recent Labs Lab 04/02/14 1250  AST 16  ALT 17  ALKPHOS 81  BILITOT 0.5  PROT 6.8  ALBUMIN 3.0*   No results found for this basename: LIPASE, AMYLASE,  in the last 168 hours No results found for this basename: AMMONIA,  in the last 168 hours CBC:  Recent Labs Lab 04/02/14 1250 04/03/14 0510  WBC 9.8 7.9  NEUTROABS 7.8*  --   HGB 12.6 11.4*  HCT 37.6 33.7*  MCV 83.0 83.0  PLT 263 287   Cardiac Enzymes:  Recent Labs Lab 04/02/14 1652 04/02/14 2233 04/03/14 0510  TROPONINI <0.30 <0.30 <0.30   BNP (last 3 results)  Recent Labs  04/02/14 1422  PROBNP 230.6*   CBG: No results found for this basename: GLUCAP,  in the last 168 hours  Recent Results (from the past 240 hour(s))  CULTURE, BLOOD (ROUTINE X 2)     Status: None   Collection Time    04/02/14  4:05 PM      Result Value Ref Range Status   Specimen  Description BLOOD LEFT ANTECUBITAL   Final   Special Requests BOTTLES DRAWN AEROBIC AND ANAEROBIC 5ML   Final   Culture  Setup Time     Final   Value: 04/02/2014 21:22     Performed at [facility]   Culture     Final   Value:        BLOOD CULTURE RECEIVED NO GROWTH TO DATE CULTURE WILL BE HELD FOR 5 DAYS BEFORE ISSUING A FINAL NEGATIVE REPORT     Performed at [facility]   Report Status PENDING   Incomplete  CULTURE, BLOOD (ROUTINE X 2)     Status: None   Collection Time    04/02/14  4:08 PM      Result Value Ref Range Status   Specimen Description BLOOD RIGHT WRIST   Final   Special Requests BOTTLES DRAWN AEROBIC ONLY 4ML   Final   Culture  Setup Time     Final   Value: 04/02/2014 21:22     Performed at [facility]   Culture     Final   Value:        BLOOD CULTURE RECEIVED NO GROWTH TO DATE CULTURE WILL BE HELD FOR 5 DAYS BEFORE ISSUING A FINAL NEGATIVE REPORT     Performed at [facility]   Report Status PENDING   Incomplete     Studies: Dg Chest 2 View  04/05/2014   CLINICAL DATA:  Cough, evaluate for pneumonia. Subsequent encounter.  EXAM: CHEST  2 VIEW  COMPARISON:  04/02/2014; 03/29/2014; chest CT -04/02/2014  FINDINGS: Grossly unchanged cardiac silhouette and mediastinal contours with mild tortuosity of the thoracic aorta. Overall improved aeration of lungs with persistent minimal right mid lung heterogeneous opacities. No new focal airspace opacities. Query trace right-sided effusion. No pneumothorax. No evidence of edema. Unchanged bones. Peripherally calcified breast prostheses.  IMPRESSION: Improved aeration of the lungs with persistent ill-defined right mid lung heterogeneous airspace opacities worrisome for residual infection. A follow-up chest radiograph in 4 to 6 weeks after treatment is recommended to ensure resolution.   Electronically Signed   By: Sandi Mariscal M.D.   On: 04/05/2014 14:02    Scheduled Meds: . arformoterol  15 mcg  Nebulization BID  . atorvastatin  40 mg Oral  Daily  . benzonatate  200 mg Oral TID  . budesonide (PULMICORT) nebulizer solution  0.5 mg Nebulization BID  . ceFEPime (MAXIPIME) IV  2 g Intravenous Q24H  . DULoxetine  90 mg Oral Daily  . enoxaparin (LOVENOX) injection  40 mg Subcutaneous Q24H  . furosemide  20 mg Intravenous Once  . ipratropium-albuterol  3 mL Nebulization QID  . lactobacillus acidophilus & bulgar  2 tablet Oral BID  . levothyroxine  112 mcg Oral Once per day on Sun Mon Tue Wed Thu Fri  . levothyroxine  168 mcg Oral Q7 days  . pantoprazole  40 mg Oral BID  . polyethylene glycol  17 g Oral Daily  . prednisoLONE acetate  1 drop Both Eyes QHS  . predniSONE  60 mg Oral Q breakfast  . sodium chloride  3 mL Intravenous Q12H  . vancomycin  500 mg Intravenous Q12H  . Vitamin D (Ergocalciferol)  50,000 Units Oral Q Sat   Continuous Infusions:    Active Problems:   Essential hypertension   Hypothyroidism   PNA (pneumonia)   Hypokalemia   Pneumonia   Asthma with acute exacerbation    Time spent: 30 min    Ashyah Quizon, [facility] Pager [phone]. If 7PM-7AM, please contact night-coverage at [email or web address], password [facility] 04/06/2014, 12:35 PM  LOS: 4 days

## 2014-04-06 NOTE — Progress Notes (Signed)
Patient is refusing CPAP at this time. Patient stated she is unable to wear the CPAP we have provided to her. RT informed patient to contact RT if changes her mind.

## 2014-04-07 LAB — BASIC METABOLIC PANEL
ANION GAP: 14 (ref 5–15)
BUN: 23 mg/dL (ref 6–23)
CO2: 34 mEq/L — ABNORMAL HIGH (ref 19–32)
Calcium: 9.3 mg/dL (ref 8.4–10.5)
Chloride: 93 mEq/L — ABNORMAL LOW (ref 96–112)
Creatinine, Ser: 0.83 mg/dL (ref 0.50–1.10)
GFR calc Af Amer: 81 mL/min — ABNORMAL LOW (ref >90)
GFR, EST NON AFRICAN AMERICAN: 70 mL/min — AB (ref >90)
Glucose, Bld: 112 mg/dL — ABNORMAL HIGH (ref 70–99)
POTASSIUM: 3.1 meq/L — AB (ref 3.7–5.3)
SODIUM: 141 meq/L (ref 137–147)

## 2014-04-07 LAB — TROPONIN I
Troponin I: <0.3 ng/mL (ref <0.30)
Troponin I: <0.3 ng/mL (ref <0.30)
Troponin I: <0.3 ng/mL (ref <0.30)

## 2014-04-07 LAB — CBC
HCT: 34.6 % — ABNORMAL LOW (ref 36.0–46.0)
Hemoglobin: 11.9 g/dL — ABNORMAL LOW (ref 12.0–15.0)
MCH: 29.8 pg (ref 26.0–34.0)
MCHC: 34.4 g/dL (ref 30.0–36.0)
MCV: 86.7 fL (ref 78.0–100.0)
PLATELETS: 359 10*3/uL (ref 150–400)
RBC: 3.99 MIL/uL (ref 3.87–5.11)
RDW: 13.6 % (ref 11.5–15.5)
WBC: 19.1 10*3/uL — AB (ref 4.0–10.5)

## 2014-04-07 MED ORDER — ALBUTEROL SULFATE HFA 108 (90 BASE) MCG/ACT IN AERS: 2.0000 | INHALATION_SPRAY | RESPIRATORY_TRACT | Status: DC | PRN

## 2014-04-07 MED ORDER — HYDROCHLOROTHIAZIDE 12.5 MG PO CAPS
12.5000 mg | ORAL_CAPSULE | Freq: Every day | ORAL | Status: DC
  Administered 2014-04-07 – 2014-04-08 (×2): 12.5 mg via ORAL
  Filled 2014-04-07 (×3): qty 1

## 2014-04-07 MED ORDER — POTASSIUM CHLORIDE CRYS ER 20 MEQ PO TBCR
40.0000 meq | EXTENDED_RELEASE_TABLET | Freq: Once | ORAL | Status: AC
  Administered 2014-04-07: 40 meq via ORAL
  Filled 2014-04-07: qty 2

## 2014-04-07 MED ORDER — POLYVINYL ALCOHOL 1.4 % OP SOLN
1.0000 [drp] | Freq: Two times a day (BID) | OPHTHALMIC | Status: DC
  Administered 2014-04-07 – 2014-04-08 (×3): 1 [drp] via OPHTHALMIC
  Filled 2014-04-07: qty 15

## 2014-04-07 MED ORDER — DOXYCYCLINE HYCLATE 100 MG PO CAPS: 100.0000 mg | ORAL_CAPSULE | Freq: Two times a day (BID) | ORAL | Status: DC

## 2014-04-07 MED ORDER — GI COCKTAIL ~~LOC~~
30.0000 mL | Freq: Once | ORAL | Status: AC
  Administered 2014-04-07: 30 mL via ORAL
  Filled 2014-04-07 (×2): qty 30

## 2014-04-07 MED ORDER — BISACODYL 10 MG RE SUPP
10.0000 mg | Freq: Once | RECTAL | Status: AC
  Administered 2014-04-07: 10 mg via RECTAL
  Filled 2014-04-07: qty 1

## 2014-04-07 MED ORDER — DOXYCYCLINE HYCLATE 100 MG PO TABS
100.0000 mg | ORAL_TABLET | Freq: Two times a day (BID) | ORAL | Status: DC
  Administered 2014-04-07 – 2014-04-08 (×3): 100 mg via ORAL
  Filled 2014-04-07 (×6): qty 1

## 2014-04-07 MED ORDER — PREDNISOLONE ACETATE 1 % OP SUSP
1.0000 [drp] | Freq: Two times a day (BID) | OPHTHALMIC | Status: DC
  Administered 2014-04-07 – 2014-04-08 (×2): 1 [drp] via OPHTHALMIC
  Filled 2014-04-07: qty 1

## 2014-04-07 MED ORDER — PREDNISONE 20 MG PO TABS: ORAL_TABLET | ORAL | Status: DC

## 2014-04-07 MED ORDER — ASPIRIN 81 MG PO CHEW
324.0000 mg | CHEWABLE_TABLET | Freq: Once | ORAL | Status: AC
  Administered 2014-04-07: 324 mg via ORAL
  Filled 2014-04-07: qty 4

## 2014-04-07 MED ORDER — ALBUTEROL SULFATE (2.5 MG/3ML) 0.083% IN NEBU: 2.5000 mg | INHALATION_SOLUTION | RESPIRATORY_TRACT | Status: DC | PRN

## 2014-04-07 MED ORDER — BENZONATATE 200 MG PO CAPS: 200.0000 mg | ORAL_CAPSULE | Freq: Three times a day (TID) | ORAL | Status: DC

## 2014-04-07 MED ORDER — NITROGLYCERIN 0.4 MG SL SUBL
SUBLINGUAL_TABLET | SUBLINGUAL | Status: AC
  Filled 2014-04-07: qty 1

## 2014-04-07 MED ORDER — POLYETHYLENE GLYCOL 3350 17 G PO PACK
17.0000 g | PACK | Freq: Once | ORAL | Status: AC
  Administered 2014-04-07: 17 g via ORAL
  Filled 2014-04-07: qty 1

## 2014-04-07 MED ORDER — ALBUTEROL SULFATE (2.5 MG/3ML) 0.083% IN NEBU
2.5000 mg | INHALATION_SOLUTION | Freq: Four times a day (QID) | RESPIRATORY_TRACT | Status: DC
  Administered 2014-04-07 – 2014-04-08 (×5): 2.5 mg via RESPIRATORY_TRACT
  Filled 2014-04-07 (×7): qty 3

## 2014-04-07 MED ORDER — NITROGLYCERIN 0.4 MG SL SUBL
0.4000 mg | SUBLINGUAL_TABLET | SUBLINGUAL | Status: DC | PRN
  Administered 2014-04-07: 0.4 mg via SUBLINGUAL

## 2014-04-07 NOTE — Progress Notes (Signed)
Pt c/o chest pain anterior, center of chest radiating toward right shoulder.  VSS, EKG performed, Dr. Sheran Fava informed with new orders obtained. Will continue to monitor. Elizabeth Walker

## 2014-04-07 NOTE — Progress Notes (Signed)
Pt successful with bowel regimen.  Pt had large, formed, soft BM. Elizabeth Walker A

## 2014-04-07 NOTE — Progress Notes (Signed)
After nitro SL pt states that her pain is a 0. VSS. Will continue to monitor. Filippa Yarbough A

## 2014-04-07 NOTE — Progress Notes (Signed)
Patient c/o chest pain.  NO associated SOB.  She has had this pain previously and attributed it to acid reflux.  She is burping a lot.   O:  VSS Exam unchanged from earlier ECG, similar to prior A/P:  Could be due to GERD, esophageal spasm, or ACS.  Pulm exam sounds unchanged from prior -  Telemetry -  Cycle troponins -  GI cocktail:  Did not help -  NTG:  Helped -  Check ECHO

## 2014-04-07 NOTE — Discharge Summary (Signed)
Physician Discharge Summary  Elizabeth Walker OAC:[id number] DOB: 08/11/1945 DOA: 04/02/2014  PCP: PROVIDER NOT IN SYSTEM  Admit date: 04/02/2014 Discharge date: 04/07/2014  Recommendations for Outpatient Follow-up:  1. F/u with PCP in 1 week.  Recommend f/u CXR in 4-6 weeks to confirm resolution and no underlying malignancy after resolution of acute illness.   2. Repeat CBC and BMP to f/u hypokalemia, leukocytosis 3. F/u with pulmonology for PFTs after resolution of acute illness  Discharge Diagnoses:  Principal Problem:   CAP (community acquired pneumonia) Active Problems:   Essential hypertension   Hypothyroidism   PNA (pneumonia)   Hypokalemia   Asthma with acute exacerbation   Discharge Condition: stable, improved  Diet recommendation:  Healthy heart  Wt Readings from Last 3 Encounters:  04/07/14 53.388 kg (117 lb 11.2 oz)  03/29/14 50.803 kg (112 lb)  04/18/13 51.393 kg (113 lb 4.8 oz)    History of present illness:  68 year old female with history of hypothyroidism, hypertension who presented with shortness of breath and cough. Her symptoms started approximately a week prior to admission and she was seen in urgent care and started on levofloxacin. She had worsening shortness of breath despite antibiotics with persistent fever to 102 the night prior to admission. She has been feeling somewhat weak and had been having diarrhea, nausea, and vomiting. X-ray in the emergency department demonstrated possible worsening pneumonia of the right lower lobe.  Hospital Course:   Community acquired pneumonia which failed outpatient treatment with levofloxacin. She presented with shortness of breath and reported fever.  She was started on vancomycin and cefepime. She remained afebrile during this admission. Her chest x-ray which initially showed worsening infiltrates was repeated after several days of antibiotics and appeared improved. Her blood cultures remain no growth to date. She CT  scan which confirmed a superior right lower lobe pneumonia. Legionella and strep pneumo antigens are negative.  Intractable cough secondary to possible acute asthma or COPD exacerbation. She did not have a previous history of COPD or asthma but she did have a history of secondhand smoke exposure. She had a very wheezy cough and wheezes on exam.  She was started on prednisone, DuoNeb. Eventually she was started on inhaled corticosteroid and long-acting beta agonist. Despite Tessalon Perles, guaifenesin with codeine, she continued to have persistent cough which interfere with her ability to perform ADLs. Her cough has gradually improved slightly. Recommend that she followup with her primary care doctor for referral for pulmonary function tests after she has completed antibiotics and a steroid taper.  Sore throat with cobblestoning of posterior oropharynx that appeared viral or allergic. She was given throat spray. She was not tested for group A strep because she has been on antibiotics which would have adequately treated this infection.  Hypokalemia, resolved with supplementation.  Dehydration, resolved with IV fluids.  Diarrhea with vomiting. She did not have further diarrhea after admission and states this has been a chronic problem. She had a GI pathogen panel which was negative. She continued her probiotics.  Hypertension, blood pressure was low normal initially. Her diuretics were resumed.  Constipation, she was given Colace, Senokot MiraLax and as needed bisacodyl.  Consultants:  [facility] Procedures:  CT chest Antibiotics:  vanc 10/20 >>  Cefepime 10/20 >>  Discharge Exam: Filed Vitals:   04/07/14 0523  BP: 143/74  Pulse: 70  Temp: 98.5 F (36.9 C)  Resp: 16   Filed Vitals:   04/06/14 2108 04/06/14 2203 04/07/14 0523 04/07/14 1001  BP:  175/88 143/74   Pulse:  103 70   Temp:  98.6 F (37 C) 98.5 F (36.9 C)   TempSrc:  Oral Oral   Resp:  24 16   Height:      Weight:    53.388 kg (117 lb 11.2 oz)   SpO2: 98% 99% 96% 97%    General: WF, frequent cough  HEENT: NCAT, MMM  Cardiovascular: RRR, nl S1, S2 no mrg, 2+ pulses, warm extremities  Respiratory: Diminished BS at the right base, wheezy cough, no focal rales or rhonchi, no increased WOB  Abdomen: NABS, soft, NT/ND  MSK: Normal tone and bulk, no LEE  Neuro: Grossly intact   Discharge Instructions      Discharge Instructions   Call MD for:  difficulty breathing, headache or visual disturbances    Complete by:  As directed      Call MD for:  extreme fatigue    Complete by:  As directed      Call MD for:  hives    Complete by:  As directed      Call MD for:  persistant dizziness or light-headedness    Complete by:  As directed      Call MD for:  persistant nausea and vomiting    Complete by:  As directed      Call MD for:  severe uncontrolled pain    Complete by:  As directed      Call MD for:  temperature >100.4    Complete by:  As directed      DME Nebulizer/meds    Complete by:  As directed      Diet - low sodium heart healthy    Complete by:  As directed      Discharge instructions    Complete by:  As directed   You were hospitalized with persistent cough and shortness of breath despite taking antibiotics for pneumonia.  You have received 5 days of antibiotics while in the hospital.  Please take doxycycline, an additional antibiotic, for the next 5 days, next dose this evening.  For your cough, take prednisone in a decreasing dose and use albuterol as needed.  I spoke with the pulmonologist who recommended against starting any additional medications until you have a chance to meet with them in clinic and have additional tests done.  I have given you a prescription for tessalon, a cough suppressant.  PLease continue your tussionex as needed for cough also.  I think you are getting a cold on top of your pneumonia and possible asthma or COPD so you may have a cough for many weeks.     Increase  activity slowly    Complete by:  As directed             Medication List    STOP taking these medications       levofloxacin 750 MG tablet  Commonly known as:  LEVAQUIN      TAKE these medications       albuterol (2.5 MG/3ML) 0.083% nebulizer solution  Commonly known as:  PROVENTIL  Take 3 mLs (2.5 mg total) by nebulization every 4 (four) hours as needed for wheezing or shortness of breath.     albuterol 108 (90 BASE) MCG/ACT inhaler  Commonly known as:  PROVENTIL HFA;VENTOLIN HFA  Inhale 2 puffs into the lungs every 4 (four) hours as needed for wheezing or shortness of breath.     atorvastatin 40 MG tablet  Commonly known as:  LIPITOR  Take 1 tablet (40 mg total) by mouth daily.     benzonatate 200 MG capsule  Commonly known as:  TESSALON  Take 1 capsule (200 mg total) by mouth 3 (three) times daily.     carboxymethylcellulose 0.5 % Soln  Commonly known as:  REFRESH PLUS  Place 1 drop into both eyes daily as needed (for dry eyes).     chlorpheniramine-HYDROcodone 10-8 MG/5ML Lqcr  Commonly known as:  TUSSIONEX  Take 5 mLs by mouth every 12 (twelve) hours as needed for cough.     doxycycline 100 MG capsule  Commonly known as:  VIBRAMYCIN  Take 1 capsule (100 mg total) by mouth 2 (two) times daily.     DULoxetine 30 MG capsule  Commonly known as:  CYMBALTA  Take 90 mg by mouth daily.     ergocalciferol 50000 UNITS capsule  Commonly known as:  VITAMIN D2  Take 50,000 Units by mouth every Saturday.     hydrochlorothiazide 12.5 MG capsule  Commonly known as:  MICROZIDE  Take 12.5 mg by mouth daily.     levothyroxine 112 MCG tablet  Commonly known as:  SYNTHROID, LEVOTHROID  Take 112-168 mcg by mouth daily before breakfast. Take 112 mcg daily, except on every Saturday takes 1 and 1/2 tablet = 168 mcg.     LORazepam 0.5 MG tablet  Commonly known as:  ATIVAN  Take 0.5 mg by mouth 2 (two) times daily as needed.     losartan 100 MG tablet  Commonly known as:   COZAAR  Take 100 mg by mouth daily.     omeprazole 40 MG capsule  Commonly known as:  PRILOSEC  Take 40 mg by mouth 2 (two) times daily.     prednisoLONE acetate 1 % ophthalmic suspension  Commonly known as:  PRED FORTE  Place 1 drop into both eyes at bedtime.     predniSONE 20 MG tablet  Commonly known as:  DELTASONE  Take 2 tabs daily x 2 day, 1 tab daily x 2 days, half tab daily x 2 days, then stop.       Follow-up Information   Follow up with PROVIDER NOT IN SYSTEM In 1 week.      Follow up with [facility]. Schedule an appointment as soon as possible for a visit in 1 month.   Specialty:  Pulmonology   Contact information:   [place] [address] [phone]      The results of significant diagnostics from this hospitalization (including imaging, microbiology, ancillary and laboratory) are listed below for reference.    Significant Diagnostic Studies: Dg Chest 2 View  04/05/2014   CLINICAL DATA:  Cough, evaluate for pneumonia. Subsequent encounter.  EXAM: CHEST  2 VIEW  COMPARISON:  04/02/2014; 03/29/2014; chest CT -04/02/2014  FINDINGS: Grossly unchanged cardiac silhouette and mediastinal contours with mild tortuosity of the thoracic aorta. Overall improved aeration of lungs with persistent minimal right mid lung heterogeneous opacities. No new focal airspace opacities. Query trace right-sided effusion. No pneumothorax. No evidence of edema. Unchanged bones. Peripherally calcified breast prostheses.  IMPRESSION: Improved aeration of the lungs with persistent ill-defined right mid lung heterogeneous airspace opacities worrisome for residual infection. A follow-up chest radiograph in 4 to 6 weeks after treatment is recommended to ensure resolution.   Electronically Signed   By: Sandi Mariscal M.D.   On: 04/05/2014 14:02   Dg Chest 2 View  04/02/2014   CLINICAL DATA:  Dry  cough for 1 week.  Shortness of breath.  EXAM: CHEST  2 VIEW  COMPARISON:   03/29/2014  FINDINGS: The cardiac silhouette, mediastinal and hilar contours are within normal limits and stable. There is mild tortuosity of the thoracic aorta. Slightly progressive airspace opacity in the superior segment of the right lower lobe which likely reflects a progressive pneumonia. This also may be accentuated by lower lung volumes. No pleural effusion. No pneumothorax. The bony thorax is intact.  IMPRESSION: Slight worsening superior segment right lower lobe infiltrate.   Electronically Signed   By: Kalman Jewels M.D.   On: 04/02/2014 14:58   Dg Chest 2 View  03/29/2014   CLINICAL DATA:  Upper abdominal pain and fever.  EXAM: CHEST  2 VIEW  COMPARISON:  02/14/2010  FINDINGS: Heart size appears normal. No pleural effusion or edema. Perihilar opacity within the right midlung is new from the previous exam. Left lung is clear. The visualized osseous structures are unremarkable.  IMPRESSION: 1. Right midlung perihilar opacity. In the acute setting this may represent pneumonia. Radiographic follow-up advised to ensure resolution.   Electronically Signed   By: Kerby Moors M.D.   On: 03/29/2014 17:59   Ct Chest W Contrast  04/02/2014   CLINICAL DATA:  Cough with worsening shortness of breath for 1-1/2 weeks. Chest pain with coughing. Intermittent fevers.  EXAM: CT CHEST WITH CONTRAST  TECHNIQUE: Multidetector CT imaging of the chest was performed during intravenous contrast administration.  CONTRAST:  80 mL OMNIPAQUE IOHEXOL 300 MG/ML  SOLN  COMPARISON:  PA and lateral chest 04/02/2014.  FINDINGS: Calcified bilateral breast implants are identified. There is no axillary or supraclavicular lymphadenopathy. No mediastinal lymphadenopathy is seen. A right hilar node measuring 1.2 cm is seen on image 22. Additional small right hilar lymph nodes are noted. There is no pleural or pericardial effusion.  The lungs demonstrate airspace disease in the superior segment of the right lower lobe with an  appearance most consistent with pneumonia. The lungs otherwise are clear.  Imaged upper abdomen demonstrates no focal abnormality. No focal bony abnormality is seen.  IMPRESSION: Airspace disease in the superior segment of right lower lobe most consistent with pneumonia. Small right hilar lymph nodes are consistent with reactive change. Recommend followup plain films to clearing.   Electronically Signed   By: Inge Rise M.D.   On: 04/02/2014 17:28   US Abdomen Complete  03/29/2014   CLINICAL DATA:  Right upper quadrant pain and fever.  EXAM: ULTRASOUND ABDOMEN COMPLETE  COMPARISON:  None.  FINDINGS: Gallbladder: No gallstones or wall thickening visualized. No sonographic Murphy sign noted.  Common bile duct: Diameter: 0.4 cm  Liver: No focal lesion identified. Within normal limits in parenchymal echogenicity.  IVC: No abnormality visualized.  Pancreas: Visualized portion unremarkable.  Spleen: Size and appearance within normal limits.  Right Kidney: Length: 9.7 cm. Echogenicity within normal limits. No mass or hydronephrosis visualized.  Left Kidney: Length: 10.1 cm. Echogenicity within normal limits. No mass. Mild caliectasis noted.  Abdominal aorta: No aneurysm visualized.  Other findings: None.  IMPRESSION: Mild caliectasis left kidney. Cause for this finding is not identified.  Normal-appearing gallbladder.  Negative for gallstones.   Electronically Signed   By: Inge Rise M.D.   On: 03/29/2014 18:58    Microbiology: Recent Results (from the past 240 hour(s))  CULTURE, BLOOD (ROUTINE X 2)     Status: None   Collection Time    04/02/14  4:05 PM  Result Value Ref Range Status   Specimen Description BLOOD LEFT ANTECUBITAL   Final   Special Requests BOTTLES DRAWN AEROBIC AND ANAEROBIC 5ML   Final   Culture  Setup Time     Final   Value: 04/02/2014 21:22     Performed at [facility]   Culture     Final   Value:        BLOOD CULTURE RECEIVED NO GROWTH TO DATE CULTURE WILL BE  HELD FOR 5 DAYS BEFORE ISSUING A FINAL NEGATIVE REPORT     Performed at [facility]   Report Status PENDING   Incomplete  CULTURE, BLOOD (ROUTINE X 2)     Status: None   Collection Time    04/02/14  4:08 PM      Result Value Ref Range Status   Specimen Description BLOOD RIGHT WRIST   Final   Special Requests BOTTLES DRAWN AEROBIC ONLY 4ML   Final   Culture  Setup Time     Final   Value: 04/02/2014 21:22     Performed at [facility]   Culture     Final   Value:        BLOOD CULTURE RECEIVED NO GROWTH TO DATE CULTURE WILL BE HELD FOR 5 DAYS BEFORE ISSUING A FINAL NEGATIVE REPORT     Performed at [facility]   Report Status PENDING   Incomplete     Labs: Basic Metabolic Panel:  Recent Labs Lab 04/02/14 1250 04/02/14 1652 04/02/14 1903 04/03/14 0510 04/07/14 0553  NA 139  --  140 145 141  K 2.6*  --  3.9 3.7 3.1*  CL 97  --  102 108 93*  CO2 28  --  25 23 34*  GLUCOSE 116*  --  151* 147* 112*  BUN 14  --  10 16 23   CREATININE 0.73  --  0.63 0.70 0.83  CALCIUM 8.8  --  8.2* 8.4 9.3  MG  --  1.9  --   --   --    Liver Function Tests:  Recent Labs Lab 04/02/14 1250  AST 16  ALT 17  ALKPHOS 81  BILITOT 0.5  PROT 6.8  ALBUMIN 3.0*   No results found for this basename: LIPASE, AMYLASE,  in the last 168 hours No results found for this basename: AMMONIA,  in the last 168 hours CBC:  Recent Labs Lab 04/02/14 1250 04/03/14 0510 04/07/14 0553  WBC 9.8 7.9 19.1*  NEUTROABS 7.8*  --   --   HGB 12.6 11.4* 11.9*  HCT 37.6 33.7* 34.6*  MCV 83.0 83.0 86.7  PLT 263 287 359   Cardiac Enzymes:  Recent Labs Lab 04/02/14 1652 04/02/14 2233 04/03/14 0510  TROPONINI <0.30 <0.30 <0.30   BNP: BNP (last 3 results)  Recent Labs  04/02/14 1422  PROBNP 230.6*   CBG: No results found for this basename: GLUCAP,  in the last 168 hours  Time coordinating discharge: 35 minutes  Signed:  Caydence Koenig  [facility] 04/07/2014,  10:49 AM

## 2014-04-07 NOTE — Progress Notes (Signed)
Patient continues to state she is unable to wear the CPAP we have provided for her.

## 2014-04-08 ENCOUNTER — Other Ambulatory Visit: Payer: Self-pay | Admitting: Endocrinology

## 2014-04-08 DIAGNOSIS — R0789 Other chest pain: Secondary | ICD-10-CM

## 2014-04-08 DIAGNOSIS — I517 Cardiomegaly: Secondary | ICD-10-CM

## 2014-04-08 DIAGNOSIS — J4521 Mild intermittent asthma with (acute) exacerbation: Secondary | ICD-10-CM

## 2014-04-08 LAB — CBC
HEMATOCRIT: 33.3 % — AB (ref 36.0–46.0)
Hemoglobin: 11.6 g/dL — ABNORMAL LOW (ref 12.0–15.0)
MCH: 30.6 pg (ref 26.0–34.0)
MCHC: 34.8 g/dL (ref 30.0–36.0)
MCV: 87.9 fL (ref 78.0–100.0)
Platelets: 354 10*3/uL (ref 150–400)
RBC: 3.79 MIL/uL — ABNORMAL LOW (ref 3.87–5.11)
RDW: 13.7 % (ref 11.5–15.5)
WBC: 20.9 10*3/uL — AB (ref 4.0–10.5)

## 2014-04-08 LAB — BASIC METABOLIC PANEL
Anion gap: 10 (ref 5–15)
BUN: 22 mg/dL (ref 6–23)
CALCIUM: 9.7 mg/dL (ref 8.4–10.5)
CO2: 35 meq/L — AB (ref 19–32)
Chloride: 94 mEq/L — ABNORMAL LOW (ref 96–112)
Creatinine, Ser: 0.72 mg/dL (ref 0.50–1.10)
GFR calc Af Amer: >90 mL/min (ref >90)
GFR calc non Af Amer: 85 mL/min — ABNORMAL LOW (ref >90)
GLUCOSE: 132 mg/dL — AB (ref 70–99)
Potassium: 4.8 mEq/L (ref 3.7–5.3)
Sodium: 139 mEq/L (ref 137–147)

## 2014-04-08 LAB — CULTURE, BLOOD (ROUTINE X 2)
CULTURE: NO GROWTH
Culture: NO GROWTH

## 2014-04-08 MED ORDER — HYDROCOD POLST-CHLORPHEN POLST 10-8 MG/5ML PO LQCR: 5.0000 mL | Freq: Two times a day (BID) | ORAL | Status: DC | PRN

## 2014-04-08 MED ORDER — NITROGLYCERIN 0.4 MG SL SUBL: 0.4000 mg | SUBLINGUAL_TABLET | SUBLINGUAL | Status: DC | PRN

## 2014-04-08 NOTE — Progress Notes (Signed)
With ambulation pt o2 stat was 95%. Elizabeth Walker A

## 2014-04-08 NOTE — Progress Notes (Signed)
  Echocardiogram 2D Echocardiogram has been performed.  Donata Clay 04/08/2014, 12:11 PM

## 2014-04-08 NOTE — Discharge Summary (Signed)
Physician Discharge Summary  Elizabeth Walker KGU:[id number] DOB: 19-Mar-1946 DOA: 04/02/2014  PCP: Thressa Sheller, MD  Admit date: 04/02/2014 Discharge date: 04/08/2014  Recommendations for Outpatient Follow-up:  1. F/u with PCP in 1 week.  Recommend f/u CXR in 4-6 weeks to confirm resolution and no underlying malignancy after resolution of acute illness.   2. Repeat CBC and BMP to f/u hypokalemia, leukocytosis 3. Please refer patient to pulmonology for PFTs after resolution of acute illness 4. Patient to schedule f/u with GI regarding severe acid reflux as this may be contributing to wheezing 5. Cardiology office to call and schedule follow up appointment for possible stress test  Discharge Diagnoses:  Principal Problem:   CAP (community acquired pneumonia) Active Problems:   Essential hypertension   Hypothyroidism   PNA (pneumonia)   Hypokalemia   Asthma with acute exacerbation   Discharge Condition: stable, improved  Diet recommendation:  Healthy heart  Wt Readings from Last 3 Encounters:  04/08/14 54.341 kg (119 lb 12.8 oz)  03/29/14 50.803 kg (112 lb)  04/18/13 51.393 kg (113 lb 4.8 oz)    History of present illness:  68 year old female with history of hypothyroidism, hypertension who presented with shortness of breath and cough. Her symptoms started approximately a week prior to admission and she was seen in urgent care and started on levofloxacin. She had worsening shortness of breath despite antibiotics with persistent fever to 102 the night prior to admission. She has been feeling somewhat weak and had been having diarrhea, nausea, and vomiting. X-ray in the emergency department demonstrated possible worsening pneumonia of the right lower lobe.    Hospital Course:   Community acquired pneumonia which failed outpatient treatment with levofloxacin. She presented with shortness of breath and reported fever.  She was afebrile in the ER and remained afebrile during her  admission.  CT scan confirmed a superior right lower lobe pneumonia.  She was started on vancomycin and cefepime. Her chest x-ray after several days of antibiotics appeared improved. Her blood cultures remain no growth to date.  Legionella and strep pneumo antigens were negative.  She had already been tested for flu and was negative. She had some upper respiratory symptoms such as sneezing and rhinorrhea and sore throat which suggested she may have had either allergies or viral URI contributing to her persistent symptoms.    Wheezing with intractable cough secondary to possible acute asthma or COPD exacerbation.  She did not have a previous history of COPD or asthma but she does have a history of secondhand smoke exposure.  She was started on prednisone and DuoNeb, however ipratropium seemed to worsen her cough so this was discontinued.  We did a trial of inhaled corticosteroid and long-acting beta agonist which also helped some, but at recommendation of pulmonologist, stopped these medications prior to outpatient PFTs.  She required Tessalon Perles, guaifenesin with codeine, and even some morphine to help with cough.  Very slowly her cough improved, but she continued to have wheeze.  ECHO demonstrated preserved EF and grade 1 DD.  She had no vascular congestion on CXR or LEE to suggest diastolic heart failure and a trial of lasix made no difference in her cough.  Recommend that she followup with her primary care doctor for referral for pulmonary function tests after she has completed antibiotics and a steroid taper.  Sore throat with cobblestoning of posterior oropharynx that appeared viral or allergic. She was given throat spray. She was not tested for group A strep because she has  been on antibiotics which would have adequately treated this infection.  Given Rx for flonase.  Atypical chest pain:  She developed some substernal chest pain with radiation to right scapula on 10/25.  Although it was not  associated with SOB, nausea, lightheadedness, diaphoresis, it did get better with NTG.  ECG appeared generally unchanged from her previous.  Telemetry demonstrated NSR.  Troponins were negative and ECHO appeared stable from prior.  She will f/u with cardiology for additional testing as outpatient.  She does have some risk factors for ACS, including HTN, age, and tobacco exposure.  She has severe GERD, however, and may have some esophageal spasm also which would have improved with NTG.  Advised continued use of ASA and given Rx for NTG to use pending her cardiology follow up appointment.    Hypokalemia, resolved with supplementation.  Dehydration, resolved with IV fluids.  Diarrhea with vomiting. She did not have further diarrhea after admission and states this has been a chronic problem. She had a GI pathogen panel which was negative. She continued her probiotics.  She became constipated and was given a bowel regimen resulting in several BMs on 10/25.  Hypertension, blood pressure was low normal initially. Her diuretics were resumed.  Constipation, she was given Colace, Senokot MiraLax and as needed bisacodyl.  Consultants:  [facility] Procedures:  CT chest Antibiotics:  vanc 10/20 >> 10/25 Cefepime 10/20 >> 10/25 Doxycycline 10/25 >>  Discharge Exam: Filed Vitals:   04/08/14 1430  BP: 143/74  Pulse:   Temp:   Resp:    Filed Vitals:   04/08/14 0913 04/08/14 1320 04/08/14 1352 04/08/14 1430  BP:  171/83 170/90 143/74  Pulse:  84    Temp:  98.2 F (36.8 C)    TempSrc:  Oral    Resp:  20    Height:      Weight:      SpO2: 98% 97%      General: WF, NAD HEENT: NCAT, MMM  Cardiovascular: RRR, nl S1, S2 no mrg, 2+ pulses, warm extremities  Respiratory: Diminished BS at the right base, + wheezing, no focal rales or rhonchi, no increased WOB  Abdomen: NABS, soft, NT/ND  MSK: Normal tone and bulk, no LEE  Neuro: Grossly intact   Discharge Instructions      Discharge  Instructions   Call MD for:  difficulty breathing, headache or visual disturbances    Complete by:  As directed      Call MD for:  extreme fatigue    Complete by:  As directed      Call MD for:  hives    Complete by:  As directed      Call MD for:  persistant dizziness or light-headedness    Complete by:  As directed      Call MD for:  persistant nausea and vomiting    Complete by:  As directed      Call MD for:  severe uncontrolled pain    Complete by:  As directed      Call MD for:  temperature >100.4    Complete by:  As directed      DME Nebulizer/meds    Complete by:  As directed      Diet - low sodium heart healthy    Complete by:  As directed      Discharge instructions    Complete by:  As directed   You were hospitalized with persistent cough and shortness of breath despite taking antibiotics  for pneumonia.  You have received 5 days of antibiotics while in the hospital.  Please take doxycycline, an additional antibiotic, for the next 5 days, next dose this evening.  For your cough, take prednisone in a decreasing dose and use albuterol as needed.  I spoke with the pulmonologist who recommended against starting any additional medications until you have a chance to meet with them in clinic and have additional tests done.  I have given you a prescription for tessalon, a cough suppressant.  PLease continue your tussionex as needed for cough also.  I think you are getting a cold on top of your pneumonia and possible asthma or COPD so you may have a cough for many weeks.     Increase activity slowly    Complete by:  As directed             Medication List    STOP taking these medications       levofloxacin 750 MG tablet  Commonly known as:  LEVAQUIN      TAKE these medications       albuterol (2.5 MG/3ML) 0.083% nebulizer solution  Commonly known as:  PROVENTIL  Take 3 mLs (2.5 mg total) by nebulization every 4 (four) hours as needed for wheezing or shortness of breath.      albuterol 108 (90 BASE) MCG/ACT inhaler  Commonly known as:  PROVENTIL HFA;VENTOLIN HFA  Inhale 2 puffs into the lungs every 4 (four) hours as needed for wheezing or shortness of breath.     atorvastatin 40 MG tablet  Commonly known as:  LIPITOR  Take 1 tablet (40 mg total) by mouth daily.     benzonatate 200 MG capsule  Commonly known as:  TESSALON  Take 1 capsule (200 mg total) by mouth 3 (three) times daily.     carboxymethylcellulose 0.5 % Soln  Commonly known as:  REFRESH PLUS  Place 1 drop into both eyes daily as needed (for dry eyes).     chlorpheniramine-HYDROcodone 10-8 MG/5ML Lqcr  Commonly known as:  TUSSIONEX  Take 5 mLs by mouth every 12 (twelve) hours as needed for cough.     doxycycline 100 MG capsule  Commonly known as:  VIBRAMYCIN  Take 1 capsule (100 mg total) by mouth 2 (two) times daily.     DULoxetine 30 MG capsule  Commonly known as:  CYMBALTA  Take 90 mg by mouth daily.     ergocalciferol 50000 UNITS capsule  Commonly known as:  VITAMIN D2  Take 50,000 Units by mouth every Saturday.     hydrochlorothiazide 12.5 MG capsule  Commonly known as:  MICROZIDE  Take 12.5 mg by mouth daily.     levothyroxine 112 MCG tablet  Commonly known as:  SYNTHROID, LEVOTHROID  Take 112-168 mcg by mouth daily before breakfast. Take 112 mcg daily, except on every Saturday takes 1 and 1/2 tablet = 168 mcg.     LORazepam 0.5 MG tablet  Commonly known as:  ATIVAN  Take 0.5 mg by mouth 2 (two) times daily as needed.     losartan 100 MG tablet  Commonly known as:  COZAAR  Take 100 mg by mouth daily.     nitroGLYCERIN 0.4 MG SL tablet  Commonly known as:  NITROSTAT  Place 1 tablet (0.4 mg total) under the tongue every 5 (five) minutes as needed for chest pain.     omeprazole 40 MG capsule  Commonly known as:  PRILOSEC  Take 40 mg by mouth  2 (two) times daily.     prednisoLONE acetate 1 % ophthalmic suspension  Commonly known as:  PRED FORTE  Place 1 drop into both  eyes at bedtime.     predniSONE 20 MG tablet  Commonly known as:  DELTASONE  Take 2 tabs daily x 2 day, 1 tab daily x 2 days, half tab daily x 2 days, then stop.       Follow-up Information   Follow up with [facility]. Schedule an appointment as soon as possible for a visit in 1 month.   Specialty:  Pulmonology   Contact information:   [place] [address] [phone]      Follow up with Melina Copa, PA-C On 04/23/2014. (2:45 PM - Dr. Jackalyn Lombard PA)    Specialty:  Cardiology   Contact information:   [address] [place] [address] [phone]       Follow up with Thressa Sheller, MD In 2 weeks.   Specialty:  Internal Medicine   Contact information:   [place], [place] [address] [phone]       The results of significant diagnostics from this hospitalization (including imaging, microbiology, ancillary and laboratory) are listed below for reference.    Significant Diagnostic Studies: Dg Chest 2 View  04/05/2014   CLINICAL DATA:  Cough, evaluate for pneumonia. Subsequent encounter.  EXAM: CHEST  2 VIEW  COMPARISON:  04/02/2014; 03/29/2014; chest CT -04/02/2014  FINDINGS: Grossly unchanged cardiac silhouette and mediastinal contours with mild tortuosity of the thoracic aorta. Overall improved aeration of lungs with persistent minimal right mid lung heterogeneous opacities. No new focal airspace opacities. Query trace right-sided effusion. No pneumothorax. No evidence of edema. Unchanged bones. Peripherally calcified breast prostheses.  IMPRESSION: Improved aeration of the lungs with persistent ill-defined right mid lung heterogeneous airspace opacities worrisome for residual infection. A follow-up chest radiograph in 4 to 6 weeks after treatment is recommended to ensure resolution.   Electronically Signed   By: Sandi Mariscal M.D.   On: 04/05/2014 14:02   Dg Chest 2 View  04/02/2014   CLINICAL DATA:   Dry cough for 1 week.  Shortness of breath.  EXAM: CHEST  2 VIEW  COMPARISON:  03/29/2014  FINDINGS: The cardiac silhouette, mediastinal and hilar contours are within normal limits and stable. There is mild tortuosity of the thoracic aorta. Slightly progressive airspace opacity in the superior segment of the right lower lobe which likely reflects a progressive pneumonia. This also may be accentuated by lower lung volumes. No pleural effusion. No pneumothorax. The bony thorax is intact.  IMPRESSION: Slight worsening superior segment right lower lobe infiltrate.   Electronically Signed   By: Kalman Jewels M.D.   On: 04/02/2014 14:58   Dg Chest 2 View  03/29/2014   CLINICAL DATA:  Upper abdominal pain and fever.  EXAM: CHEST  2 VIEW  COMPARISON:  02/14/2010  FINDINGS: Heart size appears normal. No pleural effusion or edema. Perihilar opacity within the right midlung is new from the previous exam. Left lung is clear. The visualized osseous structures are unremarkable.  IMPRESSION: 1. Right midlung perihilar opacity. In the acute setting this may represent pneumonia. Radiographic follow-up advised to ensure resolution.   Electronically Signed   By: Kerby Moors M.D.   On: 03/29/2014 17:59   Ct Chest W Contrast  04/02/2014   CLINICAL DATA:  Cough with worsening shortness of breath for 1-1/2 weeks. Chest pain with coughing. Intermittent  fevers.  EXAM: CT CHEST WITH CONTRAST  TECHNIQUE: Multidetector CT imaging of the chest was performed during intravenous contrast administration.  CONTRAST:  80 mL OMNIPAQUE IOHEXOL 300 MG/ML  SOLN  COMPARISON:  PA and lateral chest 04/02/2014.  FINDINGS: Calcified bilateral breast implants are identified. There is no axillary or supraclavicular lymphadenopathy. No mediastinal lymphadenopathy is seen. A right hilar node measuring 1.2 cm is seen on image 22. Additional small right hilar lymph nodes are noted. There is no pleural or pericardial effusion.  The lungs demonstrate  airspace disease in the superior segment of the right lower lobe with an appearance most consistent with pneumonia. The lungs otherwise are clear.  Imaged upper abdomen demonstrates no focal abnormality. No focal bony abnormality is seen.  IMPRESSION: Airspace disease in the superior segment of right lower lobe most consistent with pneumonia. Small right hilar lymph nodes are consistent with reactive change. Recommend followup plain films to clearing.   Electronically Signed   By: Inge Rise M.D.   On: 04/02/2014 17:28   US Abdomen Complete  03/29/2014   CLINICAL DATA:  Right upper quadrant pain and fever.  EXAM: ULTRASOUND ABDOMEN COMPLETE  COMPARISON:  None.  FINDINGS: Gallbladder: No gallstones or wall thickening visualized. No sonographic Murphy sign noted.  Common bile duct: Diameter: 0.4 cm  Liver: No focal lesion identified. Within normal limits in parenchymal echogenicity.  IVC: No abnormality visualized.  Pancreas: Visualized portion unremarkable.  Spleen: Size and appearance within normal limits.  Right Kidney: Length: 9.7 cm. Echogenicity within normal limits. No mass or hydronephrosis visualized.  Left Kidney: Length: 10.1 cm. Echogenicity within normal limits. No mass. Mild caliectasis noted.  Abdominal aorta: No aneurysm visualized.  Other findings: None.  IMPRESSION: Mild caliectasis left kidney. Cause for this finding is not identified.  Normal-appearing gallbladder.  Negative for gallstones.   Electronically Signed   By: Inge Rise M.D.   On: 03/29/2014 18:58    Microbiology: Recent Results (from the past 240 hour(s))  CULTURE, BLOOD (ROUTINE X 2)     Status: None   Collection Time    04/02/14  4:05 PM      Result Value Ref Range Status   Specimen Description BLOOD LEFT ANTECUBITAL   Final   Special Requests BOTTLES DRAWN AEROBIC AND ANAEROBIC 5ML   Final   Culture  Setup Time     Final   Value: 04/02/2014 21:22     Performed at [facility]   Culture     Final    Value: NO GROWTH 5 DAYS     Performed at [facility]   Report Status 04/08/2014 FINAL   Final  CULTURE, BLOOD (ROUTINE X 2)     Status: None   Collection Time    04/02/14  4:08 PM      Result Value Ref Range Status   Specimen Description BLOOD RIGHT WRIST   Final   Special Requests BOTTLES DRAWN AEROBIC ONLY 4ML   Final   Culture  Setup Time     Final   Value: 04/02/2014 21:22     Performed at [facility]   Culture     Final   Value: NO GROWTH 5 DAYS     Performed at [facility]   Report Status 04/08/2014 FINAL   Final     Labs: Basic Metabolic Panel:  Recent Labs Lab 04/02/14 1250 04/02/14 1652 04/02/14 1903 04/03/14 0510 04/07/14 0553 04/08/14 0510  NA 139  --  140 145 141 139  K 2.6*  --  3.9 3.7 3.1* 4.8  CL 97  --  102 108 93* 94*  CO2 28  --  25 23 34* 35*  GLUCOSE 116*  --  151* 147* 112* 132*  BUN 14  --  10 16 23 22   CREATININE 0.73  --  0.63 0.70 0.83 0.72  CALCIUM 8.8  --  8.2* 8.4 9.3 9.7  MG  --  1.9  --   --   --   --    Liver Function Tests:  Recent Labs Lab 04/02/14 1250  AST 16  ALT 17  ALKPHOS 81  BILITOT 0.5  PROT 6.8  ALBUMIN 3.0*   No results found for this basename: LIPASE, AMYLASE,  in the last 168 hours No results found for this basename: AMMONIA,  in the last 168 hours CBC:  Recent Labs Lab 04/02/14 1250 04/03/14 0510 04/07/14 0553 04/08/14 0510  WBC 9.8 7.9 19.1* 20.9*  NEUTROABS 7.8*  --   --   --   HGB 12.6 11.4* 11.9* 11.6*  HCT 37.6 33.7* 34.6* 33.3*  MCV 83.0 83.0 86.7 87.9  PLT 263 287 359 354   Cardiac Enzymes:  Recent Labs Lab 04/02/14 2233 04/03/14 0510 04/07/14 1123 04/07/14 1701 04/07/14 2309  TROPONINI <0.30 <0.30 <0.30 <0.30 <0.30   BNP: BNP (last 3 results)  Recent Labs  04/02/14 1422  PROBNP 230.6*   CBG: No results found for this basename: GLUCAP,  in the last 168 hours  Time coordinating discharge: 35 minutes  Signed:  Paulett Kaufhold  [facility] 04/08/2014, 4:44 PM

## 2014-04-19 ENCOUNTER — Other Ambulatory Visit (INDEPENDENT_AMBULATORY_CARE_PROVIDER_SITE_OTHER): Payer: Medicare Other

## 2014-04-19 ENCOUNTER — Encounter: Payer: Self-pay | Admitting: Internal Medicine

## 2014-04-19 ENCOUNTER — Ambulatory Visit (INDEPENDENT_AMBULATORY_CARE_PROVIDER_SITE_OTHER): Payer: Medicare Other | Admitting: Internal Medicine

## 2014-04-19 ENCOUNTER — Ambulatory Visit (INDEPENDENT_AMBULATORY_CARE_PROVIDER_SITE_OTHER)
Admission: RE | Admit: 2014-04-19 | Discharge: 2014-04-19 | Disposition: A | Discharge status: Home / Self Care | Payer: Medicare Other | Source: Ambulatory Visit | Attending: Internal Medicine | Admitting: Internal Medicine

## 2014-04-19 VITALS — BP 120/66 | HR 68 | Ht 60.5 in | Wt 114.4 lb

## 2014-04-19 DIAGNOSIS — R05 Cough: Secondary | ICD-10-CM

## 2014-04-19 DIAGNOSIS — J189 Pneumonia, unspecified organism: Secondary | ICD-10-CM

## 2014-04-19 DIAGNOSIS — I10 Essential (primary) hypertension: Secondary | ICD-10-CM

## 2014-04-19 DIAGNOSIS — R059 Cough, unspecified: Secondary | ICD-10-CM

## 2014-04-19 LAB — BASIC METABOLIC PANEL
BUN: 11 mg/dL (ref 6–23)
CHLORIDE: 100 meq/L (ref 96–112)
CO2: 27 mEq/L (ref 19–32)
CREATININE: 0.8 mg/dL (ref 0.4–1.2)
Calcium: 9.3 mg/dL (ref 8.4–10.5)
GFR: 77.42 mL/min (ref >60.00)
Glucose, Bld: 85 mg/dL (ref 70–99)
Potassium: 3.9 mEq/L (ref 3.5–5.1)
Sodium: 139 mEq/L (ref 135–145)

## 2014-04-19 LAB — CBC WITH DIFFERENTIAL/PLATELET
BASOS ABS: 0.1 10*3/uL (ref 0.0–0.1)
Basophils Relative: 1 % (ref 0.0–3.0)
Eosinophils Absolute: 0.3 10*3/uL (ref 0.0–0.7)
Eosinophils Relative: 2.9 % (ref 0.0–5.0)
HCT: 43.1 % (ref 36.0–46.0)
Hemoglobin: 14.2 g/dL (ref 12.0–15.0)
Lymphocytes Relative: 19.1 % (ref 12.0–46.0)
Lymphs Abs: 1.7 10*3/uL (ref 0.7–4.0)
MCHC: 33 g/dL (ref 30.0–36.0)
MCV: 86.1 fl (ref 78.0–100.0)
MONO ABS: 0.6 10*3/uL (ref 0.1–1.0)
Monocytes Relative: 6.5 % (ref 3.0–12.0)
Neutro Abs: 6.5 10*3/uL (ref 1.4–7.7)
Neutrophils Relative %: 70.5 % (ref 43.0–77.0)
PLATELETS: 346 10*3/uL (ref 150.0–400.0)
RBC: 5 Mil/uL (ref 3.87–5.11)
RDW: 15.1 % (ref 11.5–15.5)
WBC: 9.2 10*3/uL (ref 4.0–10.5)

## 2014-04-19 LAB — SEDIMENTATION RATE: SED RATE: 28 mm/h — AB (ref 0–22)

## 2014-04-19 NOTE — Patient Instructions (Addendum)
Stop losartan and bystolic 5 mg one daily in its place for a 2 week trial   Nexium 40 mg Take 30-60 min before first meal of the day and Pepcid 20 mg ac at bedtime chloretrimeton 4 mg  1-2 at bedtime   For drainage as needed  take chlortrimeton (chlorpheniramine) 4 mg every 4 hours available over the counter (may cause drowsiness)   GERD (REFLUX)  is an extremely common cause of respiratory symptoms just like yours , many times with no obvious heartburn at all.    It can be treated with medication, but also with lifestyle changes including avoidance of late meals, excessive alcohol, smoking cessation, and avoid fatty foods, chocolate, peppermint, colas, red wine, and acidic juices such as orange juice.  NO MINT OR MENTHOL PRODUCTS SO NO COUGH DROPS  USE SUGARLESS CANDY INSTEAD (Jolley ranchers or Stover's or Life Savers) or even ice chips will also do - the key is to swallow to prevent all throat clearing. NO OIL BASED VITAMINS - use powdered substitutes.   Please remember to go to the lab and x-ray department downstairs for your tests - we will call you with the results when they are available.    Please schedule a follow up office visit in 2 weeks, sooner if needed

## 2014-04-19 NOTE — Progress Notes (Signed)
Subjective:    Patient ID: Elizabeth Walker, female    DOB: 12-03-1945,   MRN: [id number]  HPI  58 yowf never smoker some problems with bad bronchitis all through childhood seemed better by HS with good ex tol then sinus problems year round around age 68  then saw Donneta Romberg around 2005 took shots seemed better x 3 years s any meds but seen in pulmonary clinic by [facility] for chronic cough June 2009 with completely nl spirometry and did not f/u or required any inhalers  then 2014 Jan then more persistent sinus problems, ha , coughing improved eventually after prolonged abx then Aug 2015 same thing > good abx ?Levaquin got better > then admit:  Admit date: 04/02/2014 Discharge date: 04/08/2014  Recommendations for Outpatient Follow-up:  1. F/u with PCP in 1 week. Recommend f/u CXR in 4-6 weeks to confirm resolution and no underlying malignancy after resolution of acute illness.  2. Repeat CBC and BMP to f/u hypokalemia, leukocytosis 3. Please refer patient to pulmonology for PFTs after resolution of acute illness 4. Patient to schedule f/u with GI regarding severe acid reflux as this may be contributing to wheezing 5. Cardiology office to call and schedule follow up appointment for possible stress test  Discharge Diagnoses:    CAP (community acquired pneumonia)   Essential hypertension  Hypothyroidism  PNA (pneumonia)  Hypokalemia  Asthma with acute exacerbation  History of present illness:  68 year old female with history of hypothyroidism, hypertension who presented with shortness of breath and cough. Her symptoms started approximately a week prior to admission and she was seen in urgent care and started on levofloxacin. She had worsening shortness of breath despite antibiotics with persistent fever to 102 the night prior to admission. She has been feeling somewhat weak and had been having diarrhea, nausea, and vomiting. X-ray in the emergency department demonstrated possible worsening  pneumonia of the right lower lobe.   Hospital Course:   Community acquired pneumonia which failed outpatient treatment with levofloxacin. She presented with shortness of breath and reported fever. She was afebrile in the ER and remained afebrile during her admission. CT scan confirmed a superior right lower lobe pneumonia. She was started on vancomycin and cefepime. Her chest x-ray after several days of antibiotics appeared improved. Her blood cultures remain no growth to date. Legionella and strep pneumo antigens were negative. She had already been tested for flu and was negative. She had some upper respiratory symptoms such as sneezing and rhinorrhea and sore throat which suggested she may have had either allergies or viral URI contributing to her persistent symptoms.   Wheezing with intractable cough secondary to possible acute asthma or COPD exacerbation. She did not have a previous history of COPD or asthma but she does have a history of secondhand smoke exposure. She was started on prednisone and DuoNeb, however ipratropium seemed to worsen her cough so this was discontinued. We did a trial of inhaled corticosteroid and long-acting beta agonist which also helped some, but at recommendation of pulmonologist, stopped these medications prior to outpatient PFTs. She required Tessalon Perles, guaifenesin with codeine, and even some morphine to help with cough. Very slowly her cough improved, but she continued to have wheeze. ECHO demonstrated preserved EF and grade 1 DD. She had no vascular congestion on CXR or LEE to suggest diastolic heart failure and a trial of lasix made no difference in her cough. Recommend that she followup with her primary care doctor for referral for pulmonary function tests  after she has completed antibiotics and a steroid taper.  Sore throat with cobblestoning of posterior oropharynx that appeared viral or allergic. She was given throat spray. She was not tested  for group A strep because she has been on antibiotics which would have adequately treated this infection. Given Rx for flonase.  Atypical chest pain: She developed some substernal chest pain with radiation to right scapula on 10/25. Although it was not associated with SOB, nausea, lightheadedness, diaphoresis, it did get better with NTG. ECG appeared generally unchanged from her previous. Telemetry demonstrated NSR. Troponins were negative and ECHO appeared stable from prior. She will f/u with cardiology for additional testing as outpatient. She does have some risk factors for ACS, including HTN, age, and tobacco exposure. She has severe GERD, however, and may have some esophageal spasm also which would have improved with NTG. Advised continued use of ASA and given Rx for NTG to use pending her cardiology follow up appointment.   Hypokalemia, resolved with supplementation.  Dehydration, resolved with IV fluids.  Diarrhea with vomiting. She did not have further diarrhea after admission and states this has been a chronic problem. She had a GI pathogen panel which was negative. She continued her probiotics. She became constipated and was given a bowel regimen resulting in several BMs on 10/25.  Hypertension, blood pressure was low normal initially. Her diuretics were resumed.  Constipation, she was given Colace, Senokot MiraLax and as needed bisacodyl.  Consultants:   None Procedures:   CT chest Antibiotics:   vanc 10/20 >> 10/25  Cefepime 10/20 >> 10/25  Doxycycline 10/25 >>    04/19/2014 [facility] office visit/ Norabelle Kondo   Chief Complaint  Patient presents with  . Pulmonary Consult    Self referral. Pt states that she was dxed with PNA on 03/29/14- still having dull CP, SOB, and non prod cough. She states has no energy.  Also having right side back pain.   fatigue is main problem, fever gone, sore throat better Cough with Sense of pnds but no excess or purulent  sputum and not bothering her while sleeping Doe x > slow adl since admit Not clear either hfa or nebs helping any of her symptoms   No obvious   day to day or daytime variabilty or assoc  chest tightness, subjective wheeze overt sinus   symptoms. No unusual exp hx or h/o childhood pna/ asthma or knowledge of premature birth.  Sleeping ok without nocturnal  or early am exacerbation  of respiratory  c/o's or need for noct saba. Also denies any obvious fluctuation of symptoms with weather or environmental changes or other aggravating or alleviating factors except as outlined above   Current Medications, Allergies, Complete Past Medical History, Past Surgical History, Family History, and Social History were reviewed in [employer] record.           Review of Systems  Constitutional: Negative for fever, chills and unexpected weight change.  HENT: Positive for sore throat. Negative for congestion, dental problem, ear pain, nosebleeds, postnasal drip, rhinorrhea, sinus pressure, sneezing, trouble swallowing and voice change.   Eyes: Negative for visual disturbance.  Respiratory: Positive for cough and shortness of breath. Negative for choking.   Cardiovascular: Negative for chest pain and leg swelling.  Gastrointestinal: Positive for abdominal pain. Negative for vomiting and diarrhea.  Genitourinary: Negative for difficulty urinating.       Acid heartburn Indigestion  Musculoskeletal: Negative for arthralgias.  Skin: Negative for rash.  Neurological:  Negative for tremors, syncope and headaches.  Hematological: Does not bruise/bleed easily.       Objective:   Physical Exam  amb wf nad  Wt Readings from Last 3 Encounters:  04/19/14 114 lb 6.4 oz (51.891 kg)  04/08/14 119 lb 12.8 oz (54.341 kg)  03/29/14 112 lb (50.803 kg)    Vital signs reviewed   HEENT: nl dentition, turbinates, and orophanx. Nl external ear canals without cough reflex   NECK :  without  JVD/Nodes/TM/ nl carotid upstrokes bilaterally   LUNGS: no acc muscle use, clear to A and P bilaterally without cough on insp or exp maneuvers   CV:  RRR  no s3 or murmur or increase in P2, no edema   ABD:  soft and nontender with nl excursion in the supine position. No bruits or organomegaly, bowel sounds nl  MS:  warm without deformities, calf tenderness, cyanosis or clubbing  SKIN: warm and dry without lesions    NEURO:  alert, approp, no deficits    CXR  04/19/2014 :  Interval clearing of right perihilar infiltrate.   Recent Labs Lab 04/19/14 1246  NA 139  K 3.9  CL 100  CO2 27  BUN 11  CREATININE 0.8  GLUCOSE 85    Recent Labs Lab 04/19/14 1246  HGB 14.2  HCT 43.1  WBC 9.2  PLT 346.0     Lab Results  Component Value Date   TSH 1.900 04/12/2013     Lab Results  Component Value Date   PROBNP 230.6* 04/02/2014     Lab Results  Component Value Date   ESRSEDRATE 28* 04/19/2014           Assessment & Plan:

## 2014-04-20 MED ORDER — NEBIVOLOL HCL 5 MG PO TABS: 5.0000 mg | ORAL_TABLET | Freq: Every day | ORAL | Status: DC

## 2014-04-20 NOTE — Assessment & Plan Note (Addendum)
Spirometry June 2009 wnl MRI brain 06/2012 neg sinus dz  The most common causes of chronic cough in immunocompetent adults include the following: upper airway cough syndrome (UACS), previously referred to as postnasal drip syndrome (PNDS), which is caused by variety of rhinosinus conditions; (2) asthma; (3) GERD; (4) chronic bronchitis from cigarette smoking or other inhaled environmental irritants; (5) nonasthmatic eosinophilic bronchitis; and (6) bronchiectasis.   These conditions, singly or in combination, have accounted for up to 94% of the causes of chronic cough in prospective studies.   Other conditions have constituted no >6% of the causes in prospective studies These have included bronchogenic carcinoma, chronic interstitial pneumonia, sarcoidosis, left ventricular failure, ACEI-induced cough, and aspiration from a condition associated with pharyngeal dysfunction.    Chronic cough is often simultaneously caused by more than one condition. A single cause has been found from 38 to 82% of the time, multiple causes from 18 to 62%. Multiply caused cough has been the result of three diseases up to 42% of the time.       Based on hx and exam, this is most likely:  Classic Upper airway cough syndrome, so named because it's frequently impossible to sort out how much is  CR/sinusitis with freq throat clearing (which can be related to primary GERD)   vs  causing  secondary (" extra esophageal")  GERD from wide swings in gastric pressure that occur with throat clearing, often  promoting self use of mint and menthol lozenges that reduce the lower esophageal sphincter tone and exacerbate the problem further in a cyclical fashion.   These are the same pts (now being labeled as having "irritable larynx syndrome" by some cough centers) who not infrequently have a history of having failed to tolerate ace inhibitors and now even  Reported on ,  dry powder inhalers or biphosphonates or report having atypical  reflux symptoms that don't respond to standard doses of PPI , and are easily confused as having aecopd or asthma flares by even experienced allergists/ pulmonologists.   The first step is to maximize acid suppression and  Add 1st gen H1 per guidelines Also try off losartan (see hbp)

## 2014-04-20 NOTE — Assessment & Plan Note (Signed)
Sup segment RLL 04/08/14 > complegely resolved on f/u cxr 04/19/14   No further abx needed   See cough

## 2014-04-20 NOTE — Assessment & Plan Note (Addendum)
For reasons that may related to vascular permability and nitric oxide pathways but not elevated  bradykinin levels (as seen with  ACEi use) losartan in the generic form has been reported now from mulitple sources  to cause a similar pattern of non-specific  upper airway symptoms as seen with acei.   This has not been reported with exposure to the other ARB's to date, so it seems reasonable for now to try either generic diovan or avapro if ARB needed or use an alternative class altogether.  See:  Lelon Frohlich Allergy Asthma Immunol  2008: 101: p [phone]    Strongly prefer in this setting: Bystolic, the most beta -1  selective Beta blocker available in sample form, with bisoprolol the most selective generic choice  on the market.   Will try samples of bystolic 5 mg per day and f/u in 2 weeks to group with further w/u if cough persists

## 2014-04-22 ENCOUNTER — Telehealth: Payer: Self-pay | Admitting: Internal Medicine

## 2014-04-22 NOTE — Telephone Encounter (Signed)
Called pt to give cxr results  She states that her BP has been elevated since we switched her from losartan 875 mg to bystolic 5 mg  Readings have been between 150-200 / 90-100  Please advise thanks!

## 2014-04-22 NOTE — Telephone Encounter (Signed)
Increase to bid and if need to call in something before next ov we can do the bystolic 10 mg daily or the bisoprolol 5 mg daily in generic

## 2014-04-22 NOTE — Telephone Encounter (Signed)
Pt aware of recs. She will try increasing first. Nothing further needed

## 2014-04-22 NOTE — Progress Notes (Signed)
Quick Note:  Spoke with pt and notified of results per Dr. Wert. Pt verbalized understanding and denied any questions.  ______ 

## 2014-04-23 ENCOUNTER — Encounter: Payer: Self-pay | Admitting: Physician Assistant

## 2014-04-23 ENCOUNTER — Ambulatory Visit (INDEPENDENT_AMBULATORY_CARE_PROVIDER_SITE_OTHER): Payer: Medicare Other | Admitting: Physician Assistant

## 2014-04-23 ENCOUNTER — Telehealth: Payer: Self-pay | Admitting: Cardiology

## 2014-04-23 VITALS — BP 132/70 | HR 55 | Ht 63.0 in | Wt 113.0 lb

## 2014-04-23 DIAGNOSIS — K219 Gastro-esophageal reflux disease without esophagitis: Secondary | ICD-10-CM

## 2014-04-23 DIAGNOSIS — I10 Essential (primary) hypertension: Secondary | ICD-10-CM

## 2014-04-23 DIAGNOSIS — E7800 Pure hypercholesterolemia, unspecified: Secondary | ICD-10-CM | POA: Insufficient documentation

## 2014-04-23 DIAGNOSIS — R072 Precordial pain: Secondary | ICD-10-CM

## 2014-04-23 LAB — TROPONIN I

## 2014-04-23 NOTE — Progress Notes (Signed)
[place], [address] [place], [place]  [address] Phone: [phone] Fax:  [phone]  Date:  04/23/2014   Patient ID:  Elizabeth, Walker 10/24/1945, MRN [id number]   PCP:  Thressa Sheller, MD  Cardiologist:  Dr. Rayann Heman remotely; she also says she's seen Dr. Tamala Julian.   History of Present Illness: Elizabeth Walker is a 68 y.o. female with history of HTN, HLD, family history of CAD, severe GERD, h/o esophageal spasm, OSA intolerant of CPAP, rare migraines who presents for evaluation of chest pain. She last saw Dr. Rayann Heman in 2013 for palpitations/tachycardia felt due to sinus tachycardia attributed to HCTZ. 2D echo and event monitor were unremarkable at that time. She also says Dr. Tamala Julian diagnosed her with mitral valve prolapse by exam several years ago. Fortunately echocardiograms have not demonstrated any significant valvular pathology.  She was recently admitted 10/20-10/26 with pneumonia with wheezing felt secondary to some sort of airway disease. It's not certain if it is COPD or asthma. She has since seen Dr. Melvyn Novas who believes she has upper airway cough syndrome. He recommended to d/c losartan, add H2 blocker, and start Bystolic. She is referred to [name] for chest pain. This has been present for at least 15 years per the patient and her husband. This now occurs 3x per week, predominantly at night when laying down to sleep. This is associated with a lot of burping as well as mild dyspnea. The pain usually moves on down to her belly before going away. She has had some nausea and sweating before but not every time. It is not associated with a particular type of meal. She took a SL NTG with one episode and pain resolved about 10 minutes later. No syncope, LEE, orthopnea, BRBPR or melena. She does not exercise. The most exertion she does is vacuum. She feels fatigued when she does so, but denies chest pain or dyspnea with this. She had a sister who had open heart surgery at 30 and a brother who had it  at 65. Her father died at 20 of a massive MI. She has never smoked.   Her last episode was 2 nights ago and lasted for hours --  all night long. She reports that she had chest pain throughout her whole hospital stay in October. Troponins were normal at that time. Echo 04/08/14: EF 65-70%, no RWMA, + grade 1 d/d, mild LAE, trace MR, doppler parameters consistent with high ventricular filling pressures. pBNP was not significantly elevated and she was not felt to have volume overload.  Recent Labs: 04/02/2014: ALT 17; Pro B Natriuretic peptide (BNP) 230.6* 04/19/2014: Creatinine 0.8; Hemoglobin 14.2; Potassium 3.9  Wt Readings from Last 3 Encounters:  04/23/14 113 lb (51.256 kg)  04/19/14 114 lb 6.4 oz (51.891 kg)  04/08/14 119 lb 12.8 oz (54.341 kg)     Past Medical History  Diagnosis Date  . GERD (gastroesophageal reflux disease)   . Hypercholesteremia   . Hypothyroid   . Hypertension   . Depression   . Osteoporosis   . Anxiety   . Arthritis   . Sinusitis   . Tachycardia   . Sleep apnea     intolerant to CPAP  . Migraine   . Pneumonia     Current Outpatient Prescriptions  Medication Sig Dispense Refill  . acetaminophen (TYLENOL) 500 MG tablet Take 500 mg by mouth every 6 (six) hours as needed.    Marland Kitchen atorvastatin (LIPITOR) 40 MG tablet TAKE ONE TABLET BY MOUTH  ONE TIME DAILY  90 tablet 0  . benzonatate (TESSALON) 200 MG capsule Take 1 capsule (200 mg total) by mouth 3 (three) times daily. 20 capsule 0  . chlorpheniramine-HYDROcodone (TUSSIONEX) 10-8 MG/5ML LQCR Take 5 mLs by mouth every 12 (twelve) hours as needed for cough. 115 mL 0  . DULoxetine (CYMBALTA) 30 MG capsule Take 90 mg by mouth daily.     . ergocalciferol (VITAMIN D2) 50000 UNITS capsule Take 50,000 Units by mouth every Saturday.     . esomeprazole (NEXIUM) 40 MG capsule Take 40 mg by mouth daily at 12 noon.    . hydrochlorothiazide (MICROZIDE) 12.5 MG capsule Take 12.5 mg by mouth daily.     Marland Kitchen levothyroxine  (SYNTHROID, LEVOTHROID) 112 MCG tablet Take 112-168 mcg by mouth daily before breakfast. Take 112 mcg daily, except on every Saturday takes 1 and 1/2 tablet = 168 mcg.    Marland Kitchen LORazepam (ATIVAN) 0.5 MG tablet Take 0.5 mg by mouth 2 (two) times daily as needed.     . nebivolol (BYSTOLIC) 5 MG tablet Take 1 tablet (5 mg total) by mouth daily. 30 tablet 0  . nitroGLYCERIN (NITROSTAT) 0.4 MG SL tablet Place 1 tablet (0.4 mg total) under the tongue every 5 (five) minutes as needed for chest pain. 20 tablet 0  . albuterol (PROVENTIL HFA;VENTOLIN HFA) 108 (90 BASE) MCG/ACT inhaler Inhale 2 puffs into the lungs every 4 (four) hours as needed for wheezing or shortness of breath. 1 Inhaler 0  . albuterol (PROVENTIL) (2.5 MG/3ML) 0.083% nebulizer solution Take 3 mLs (2.5 mg total) by nebulization every 4 (four) hours as needed for wheezing or shortness of breath. 150 mL 12   No current facility-administered medications for this visit.    Allergies:   Review of patient's allergies indicates no known allergies.   Social History:  The patient  reports that she has never smoked. She has never used smokeless tobacco. She reports that she drinks alcohol. She reports that she does not use illicit drugs.   Family History:  The patient's family history includes Asthma in her brother and father; Coronary artery disease in an other family member; Diabetes in her sister; Emphysema in her father; Heart disease in her brother, father, and sister; Kidney failure in her brother.   ROS:  Please see the history of present illness.  She has occasional dizziness when changing head positions quickly, IE bending over, or looking from left to right quickly (she gives the example of the grocery store aisles). All other systems reviewed and negative.  PHYSICAL EXAM:  VS:  BP 132/70 mmHg  Pulse 55  Ht 5\' 3"  (1.6 m)  Wt 113 lb (51.256 kg)  BMI 20.02 kg/m2 Well nourished, well developed WF, in no acute distress, appears younger than  stated age 38: normal Neck: no JVD, no carotid bruits Cardiac:  normal S1, S2; RRR; no murmur Lungs:  clear to auscultation bilaterally, no wheezing, rhonchi or rales Abd: soft, nontender, no hepatomegaly Ext: no edema Skin: warm and dry Neuro:  moves all extremities spontaneously, no focal abnormalities noted  EKG:  Sinus bradycardia 55bpm, no acute ST-T changes, QTc 403  ASSESSMENT AND PLAN:  1. Precordial chest pain 2. HTN 3. Hyperlipidemia 4. GI history of severe GERD and esophageal spasm  Symptoms are somewhat atypical and more reminiscent of GI pathology. Despite 15 years of this stereotypical pain, it has not yet declared itself as a cardiac event. However, she does have significant risk factors including HTN,  HLD, and family history of coronary artery disease. Will plan to check a troponin since she had chest pain all night long two nights ago. Presumably if this was of cardiac origin then the level would still be elevated. If this is abnormal, I told her we would let her know she needed to go to the hospital tonight and would plan on cardiac catheterization the next day. If it is normal, however, we will plan on outpatient exercise stress test to risk stratify. I let the on-call NP this evening know of the plan so that she can call the patient with the result. Ms. Paden has no history of GI bleeding or peptic ulcer disease thus I started low dose aspirin today as a precaution. Bleeding precautions discussed. Will continue statin and beta blocker for now. She reports she had recent lipid panel done in September and will call our office to give [name] those results.  Dispo: F/u 2-3 weeks with Dr. Rayann Heman or myself.  Signed, Melina Copa, PA-C  04/23/2014 3:29 PM

## 2014-04-23 NOTE — Progress Notes (Signed)
Quick Note:  LMTCB ______ 

## 2014-04-23 NOTE — Telephone Encounter (Signed)
Called pt result of troponin, normal.

## 2014-04-23 NOTE — Patient Instructions (Addendum)
Your physician has recommended you make the following change in your medication:   START TAKING [place] MG   Your physician recommends that you return for lab work in:  [place] has requested that you have en exercise stress myoview. For further information please visit [email or web address]. Please follow instruction sheet, as given.   Your physician recommends that you schedule a follow-up appointment in:  WITH DR ALLRED OR DANA DUNN  IN 2 TO 3 WEEKS     PLEASE CALL OFFICE WITH CHOLESTEROL RESULTS

## 2014-04-24 ENCOUNTER — Telehealth: Payer: Self-pay | Admitting: Internal Medicine

## 2014-04-24 NOTE — Telephone Encounter (Signed)
I spoke with the pt and notified her of results  She verbalized understanding  Nothing further needed

## 2014-04-24 NOTE — Progress Notes (Signed)
Quick Note:  Spoke with pt and notified of results per Dr. Wert. Pt verbalized understanding and denied any questions.  ______ 

## 2014-04-24 NOTE — Telephone Encounter (Signed)
Return call.Stanley A Dalton °

## 2014-04-29 ENCOUNTER — Ambulatory Visit (HOSPITAL_COMMUNITY): Payer: Medicare Other | Attending: Cardiology | Admitting: Radiology

## 2014-04-29 VITALS — BP 123/57 | Ht 63.0 in | Wt 109.0 lb

## 2014-04-29 DIAGNOSIS — R5383 Other fatigue: Secondary | ICD-10-CM | POA: Diagnosis not present

## 2014-04-29 DIAGNOSIS — R079 Chest pain, unspecified: Secondary | ICD-10-CM | POA: Insufficient documentation

## 2014-04-29 DIAGNOSIS — E785 Hyperlipidemia, unspecified: Secondary | ICD-10-CM | POA: Diagnosis not present

## 2014-04-29 DIAGNOSIS — J449 Chronic obstructive pulmonary disease, unspecified: Secondary | ICD-10-CM | POA: Diagnosis not present

## 2014-04-29 DIAGNOSIS — R072 Precordial pain: Secondary | ICD-10-CM

## 2014-04-29 DIAGNOSIS — R0602 Shortness of breath: Secondary | ICD-10-CM | POA: Diagnosis not present

## 2014-04-29 DIAGNOSIS — I10 Essential (primary) hypertension: Secondary | ICD-10-CM | POA: Insufficient documentation

## 2014-04-29 MED ORDER — TECHNETIUM TC 99M SESTAMIBI GENERIC - CARDIOLITE
10.0000 | Freq: Once | INTRAVENOUS | Status: AC | PRN
  Administered 2014-04-29: 10 via INTRAVENOUS

## 2014-04-29 MED ORDER — AMINOPHYLLINE 25 MG/ML IV SOLN
75.0000 mg | Freq: Once | INTRAVENOUS | Status: AC
  Administered 2014-04-29: 75 mg via INTRAVENOUS

## 2014-04-29 MED ORDER — TECHNETIUM TC 99M SESTAMIBI GENERIC - CARDIOLITE
30.0000 | Freq: Once | INTRAVENOUS | Status: AC | PRN
  Administered 2014-04-29: 30 via INTRAVENOUS

## 2014-04-29 MED ORDER — REGADENOSON 0.4 MG/5ML IV SOLN
0.4000 mg | Freq: Once | INTRAVENOUS | Status: AC
  Administered 2014-04-29: 0.4 mg via INTRAVENOUS

## 2014-04-29 NOTE — Progress Notes (Signed)
[facility] [address] [place], [place] [address] [phone]    Cardiology Nuclear Med Study  Elizabeth Walker is a 68 y.o. female     MRN : [id number]     DOB: Oct 20, 1945  Procedure Date: 04/29/2014  Nuclear Med Background Indication for Stress Test:  Evaluation for Ischemia History:  Asthma, COPD  Cardiac Risk Factors: Hypertension and Lipids  Symptoms:  Chest Pain, Fatigue and SOB   Nuclear Pre-Procedure Caffeine/Decaff Intake:  None> 12 hrs NPO After: 8:00pm   Lungs:  clear O2 Sat: 98% on room air. IV 0.9% NS with Angio Cath:  22g  IV Site: R Hand x 1, tolerated well IV Started by:  Irven Baltimore, RN  Chest Size (in):  36 Cup Size: C  Height: 5\' 3"  (1.6 m)  Weight:  109 lb (49.442 kg)  BMI:  Body mass index is 19.31 kg/(m^2). Tech Comments:  Patient held Bystolic x 24 hrs. Irven Baltimore, RN. Aminophylline 75 mg given for symptoms. All were resolved before leaving. S.Williams EMTP    Nuclear Med Study 1 or 2 day study: 1 day  Stress Test Type:  Treadmill/Lexiscan  Reading MD: N/A  Order Authorizing Provider:  Daneen Schick, III, MD, Melina Copa, PAC, and Thompson Grayer, MD  Resting Radionuclide: Technetium 49m Sestamibi  Resting Radionuclide Dose: 11.0 mCi   Stress Radionuclide:  Technetium 61m Sestamibi  Stress Radionuclide Dose: 33.0 mCi           Stress Protocol Rest HR: 54 Stress HR: 121  Rest BP: 123/57 Stress BP: 156/89  Exercise Time (min): 10:25 METS: 8.0   Predicted Max HR: 150 bpm % Max HR: 80.67 bpm Rate Pressure Product: 18876   Dose of Adenosine (mg):  n/a Dose of Lexiscan: 0.4 mg  Dose of Atropine (mg): n/a Dose of Dobutamine: n/a mcg/kg/min (at max HR)  Stress Test Technologist: Perrin Maltese, EMT-P  Nuclear Technologist:  Earl Many, CNMT     Rest Procedure:  Myocardial perfusion imaging was performed at rest 45 minutes following the intravenous administration of Technetium 81m Sestamibi. Rest ECG: NSR, old  anteroseptal MI  Stress Procedure:  The patient received IV Lexiscan 0.4 mg over 15-seconds with concurrent low level exercise and then Technetium 29m Sestamibi was injected at 30-seconds while the patient continued walking one more minute.  This patient had neck tightness, headache, sob, and fatigue with the Lexiscan injection.Quantitative spect images were obtained after a 45-minute delay. Stress ECG: No significant change from baseline ECG and No significant ST segment change suggestive of ischemia.  QPS Raw Data Images:  Normal; no motion artifact; normal heart/lung ratio. Stress Images:  Normal homogeneous uptake in all areas of the myocardium. Rest Images:  Normal homogeneous uptake in all areas of the myocardium. Subtraction (SDS):  No evidence of ischemia. Transient Ischemic Dilatation (Normal <1.22):  0.85 Lung/Heart Ratio (Normal <0.45):  0.39  Quantitative Gated Spect Images QGS EDV:  29 ml QGS ESV:  4 ml  Impression Exercise Capacity:  Fair exercise capacity. BP Response:  Normal blood pressure response. Clinical Symptoms:  The exercise was limited by fatigue and the study was converted to a Lexiscan procedure.. ECG Impression:  Non diagnostic due to limited workload/heart rate achieved Comparison with Prior Nuclear Study: No previous nuclear study performed  Overall Impression:  Normal stress nuclear study.  LV Ejection Fraction: 86% (overestimated due to small heart size).  LV Wall Motion:  NL LV Function; NL Wall Motion  Sanda Klein, MD, [facility] [phone] office [phone] pager

## 2014-04-30 ENCOUNTER — Other Ambulatory Visit (HOSPITAL_COMMUNITY): Payer: Self-pay | Admitting: Internal Medicine

## 2014-04-30 ENCOUNTER — Encounter (HOSPITAL_COMMUNITY): Payer: Self-pay

## 2014-04-30 ENCOUNTER — Telehealth: Payer: Self-pay | Admitting: *Deleted

## 2014-04-30 ENCOUNTER — Ambulatory Visit (HOSPITAL_COMMUNITY)
Admission: RE | Admit: 2014-04-30 | Discharge: 2014-04-30 | Disposition: A | Discharge status: Home / Self Care | Payer: Medicare Other | Source: Ambulatory Visit | Attending: Internal Medicine | Admitting: Internal Medicine

## 2014-04-30 DIAGNOSIS — R0602 Shortness of breath: Secondary | ICD-10-CM

## 2014-04-30 MED ORDER — IOHEXOL 300 MG/ML  SOLN: 90.0000 mL | Freq: Once | INTRAMUSCULAR | Status: DC | PRN

## 2014-04-30 MED ORDER — IOHEXOL 350 MG/ML SOLN
90.0000 mL | Freq: Once | INTRAVENOUS | Status: AC | PRN
  Administered 2014-04-30: 90 mL via INTRAVENOUS

## 2014-04-30 NOTE — Telephone Encounter (Signed)
pt notified about myoview results and was advised to f/u w/GI Dr. Darlis Loan in [facility] for non cardiac chest pain. Pt asked why was she still having problems. I did say could be releated to her asthma or gerd as well. Pt said ok and thank you.

## 2014-04-30 NOTE — Telephone Encounter (Signed)
pt was advised again to make sure to f/u with GI or pulmonary since her stresst was normal. I explained to pt that asthma and gerd symptoms can cause chest discomfort at times as well and she should d/w those appropriate dr for her. pt said thank you.

## 2014-05-03 ENCOUNTER — Ambulatory Visit (INDEPENDENT_AMBULATORY_CARE_PROVIDER_SITE_OTHER): Payer: Medicare Other | Admitting: Adult Health

## 2014-05-03 ENCOUNTER — Encounter: Payer: Self-pay | Admitting: Adult Health

## 2014-05-03 ENCOUNTER — Ambulatory Visit: Payer: Medicare Other | Admitting: Internal Medicine

## 2014-05-03 VITALS — BP 100/60 | HR 61 | Temp 97.2°F | Ht 60.5 in | Wt 115.8 lb

## 2014-05-03 DIAGNOSIS — J4531 Mild persistent asthma with (acute) exacerbation: Secondary | ICD-10-CM

## 2014-05-03 MED ORDER — BENZONATATE 200 MG PO CAPS: 200.0000 mg | ORAL_CAPSULE | Freq: Three times a day (TID) | ORAL | Status: DC

## 2014-05-03 MED ORDER — HYDROCOD POLST-CHLORPHEN POLST 10-8 MG/5ML PO LQCR: 5.0000 mL | Freq: Two times a day (BID) | ORAL | Status: DC | PRN

## 2014-05-03 NOTE — Patient Instructions (Signed)
Begin Delsym 2 teaspoons twice daily for cough Begin Tessalon 3 times daily for cough May use Tussionex at bedtime as needed for severe cough, may make you sleepy Begin Zyrtec 10 mg in the morning Begin Chlor-Trimeton 4 mg 2 at bedtime May add on Chlor-Trimeton 4 mg 1 every 4 hours as needed for drainage Using Nexium daily in the morning before you eat Begin Zantac 150 mg at bedtime GERD diet Use sips of water and sugarless candy to help avoid throat clearing and coughing Avoid all mint products and gum Follow-up with Dr. Melvyn Novas in 6 weeks with a pulmonary function test

## 2014-05-06 ENCOUNTER — Telehealth: Payer: Self-pay | Admitting: Internal Medicine

## 2014-05-06 NOTE — Progress Notes (Signed)
Subjective:    Patient ID: Elizabeth Walker, female    DOB: 12-03-1945,   MRN: [id number]  HPI  58 yowf never smoker some problems with bad bronchitis all through childhood seemed better by HS with good ex tol then sinus problems year round around age 68  then saw Donneta Romberg around 2005 took shots seemed better x 3 years s any meds but seen in pulmonary clinic by [facility] for chronic cough June 2009 with completely nl spirometry and did not f/u or required any inhalers  then 2014 Jan then more persistent sinus problems, ha , coughing improved eventually after prolonged abx then Aug 2015 same thing > good abx ?Levaquin got better > then admit:  Admit date: 04/02/2014 Discharge date: 04/08/2014  Recommendations for Outpatient Follow-up:  1. F/u with PCP in 1 week. Recommend f/u CXR in 4-6 weeks to confirm resolution and no underlying malignancy after resolution of acute illness.  2. Repeat CBC and BMP to f/u hypokalemia, leukocytosis 3. Please refer patient to pulmonology for PFTs after resolution of acute illness 4. Patient to schedule f/u with GI regarding severe acid reflux as this may be contributing to wheezing 5. Cardiology office to call and schedule follow up appointment for possible stress test  Discharge Diagnoses:    CAP (community acquired pneumonia)   Essential hypertension  Hypothyroidism  PNA (pneumonia)  Hypokalemia  Asthma with acute exacerbation  History of present illness:  68 year old female with history of hypothyroidism, hypertension who presented with shortness of breath and cough. Her symptoms started approximately a week prior to admission and she was seen in urgent care and started on levofloxacin. She had worsening shortness of breath despite antibiotics with persistent fever to 102 the night prior to admission. She has been feeling somewhat weak and had been having diarrhea, nausea, and vomiting. X-ray in the emergency department demonstrated possible worsening  pneumonia of the right lower lobe.   Hospital Course:   Community acquired pneumonia which failed outpatient treatment with levofloxacin. She presented with shortness of breath and reported fever. She was afebrile in the ER and remained afebrile during her admission. CT scan confirmed a superior right lower lobe pneumonia. She was started on vancomycin and cefepime. Her chest x-ray after several days of antibiotics appeared improved. Her blood cultures remain no growth to date. Legionella and strep pneumo antigens were negative. She had already been tested for flu and was negative. She had some upper respiratory symptoms such as sneezing and rhinorrhea and sore throat which suggested she may have had either allergies or viral URI contributing to her persistent symptoms.   Wheezing with intractable cough secondary to possible acute asthma or COPD exacerbation. She did not have a previous history of COPD or asthma but she does have a history of secondhand smoke exposure. She was started on prednisone and DuoNeb, however ipratropium seemed to worsen her cough so this was discontinued. We did a trial of inhaled corticosteroid and long-acting beta agonist which also helped some, but at recommendation of pulmonologist, stopped these medications prior to outpatient PFTs. She required Tessalon Perles, guaifenesin with codeine, and even some morphine to help with cough. Very slowly her cough improved, but she continued to have wheeze. ECHO demonstrated preserved EF and grade 1 DD. She had no vascular congestion on CXR or LEE to suggest diastolic heart failure and a trial of lasix made no difference in her cough. Recommend that she followup with her primary care doctor for referral for pulmonary function tests  after she has completed antibiotics and a steroid taper.  Sore throat with cobblestoning of posterior oropharynx that appeared viral or allergic. She was given throat spray. She was not tested  for group A strep because she has been on antibiotics which would have adequately treated this infection. Given Rx for flonase.  Atypical chest pain: She developed some substernal chest pain with radiation to right scapula on 10/25. Although it was not associated with SOB, nausea, lightheadedness, diaphoresis, it did get better with NTG. ECG appeared generally unchanged from her previous. Telemetry demonstrated NSR. Troponins were negative and ECHO appeared stable from prior. She will f/u with cardiology for additional testing as outpatient. She does have some risk factors for ACS, including HTN, age, and tobacco exposure. She has severe GERD, however, and may have some esophageal spasm also which would have improved with NTG. Advised continued use of ASA and given Rx for NTG to use pending her cardiology follow up appointment.   Hypokalemia, resolved with supplementation.  Dehydration, resolved with IV fluids.  Diarrhea with vomiting. She did not have further diarrhea after admission and states this has been a chronic problem. She had a GI pathogen panel which was negative. She continued her probiotics. She became constipated and was given a bowel regimen resulting in several BMs on 10/25.  Hypertension, blood pressure was low normal initially. Her diuretics were resumed.  Constipation, she was given Colace, Senokot MiraLax and as needed bisacodyl.  Consultants:   None Procedures:   CT chest Antibiotics:   vanc 10/20 >> 10/25  Cefepime 10/20 >> 10/25  Doxycycline 10/25 >>    04/19/2014 1st [facility] office visit/ [facility]   Chief Complaint  Patient presents with  . Pulmonary Consult    Self referral. Pt states that she was dxed with PNA on 03/29/14- still having dull CP, SOB, and non prod cough. She states has no energy.  Also having right side back pain.   fatigue is main problem, fever gone, sore throat better Cough with Sense of pnds but no excess or purulent  sputum and not bothering her while sleeping Doe x > slow adl since admit Not clear either hfa or nebs helping any of her symptoms  >CXR showed cleared infiltrate  Labs with min elevated BNP ~230, ESR 28  Change losartan to bystolic   64/40/34 Follow up  Returns for follow up for cough  Seen last ov 2 weeks ago for pulmonary consult for cough.  Cough not change with cough at night.  She had CT chest angio which was neg for PE, showed bronchitic changes.  Has on/off wheezing  Patient denies any chest pain, orthopnea, PND, edema, hemoptysis, nausea, vomiting , or fever He does complain of intermittent heartburn and sinus drainage BNP was slightly elevated at 230 , recent echo showed gr 1 DD with nml EF.   Current Medications, Allergies, Complete Past Medical History, Past Surgical History, Family History, and Social History were reviewed in [employer] record.           Review of Systems  Constitutional:   No  weight loss, night sweats,  Fevers, chills, fatigue, or  lassitude.  HEENT:   No headaches,  Difficulty swallowing,  Tooth/dental problems, or  Sore throat,                No sneezing, itching, ear ache +, nasal congestion, post nasal drip,   CV:  No chest pain,  Orthopnea, PND, swelling in lower extremities,  anasarca, dizziness, palpitations, syncope.   GI  No heartburn, indigestion, abdominal pain, nausea, vomiting, diarrhea, change in bowel habits, loss of appetite, bloody stools.   Resp: .  No chest wall deformity  Skin: no rash or lesions.  GU: no dysuria, change in color of urine, no urgency or frequency.  No flank pain, no hematuria   MS:  No joint pain or swelling.  No decreased range of motion.  No back pain.  Psych:  No change in mood or affect. No depression or anxiety.  No memory loss.          Objective:   Physical Exam  amb wf nad   Vital signs reviewed   HEENT: nl dentition, turbinates, and orophanx. Nl external ear  canals without cough reflex   NECK :  without JVD/Nodes/TM/ nl carotid upstrokes bilaterally   LUNGS: no acc muscle use, clear to A and P bilaterally without cough on insp or exp maneuvers   CV:  RRR  no s3 or murmur or increase in P2, no edema   ABD:  soft and nontender with nl excursion in the supine position. No bruits or organomegaly, bowel sounds nl  MS:  warm without deformities, calf tenderness, cyanosis or clubbing  SKIN: warm and dry without lesions    NEURO:  alert, approp, no deficits    CXR  04/19/2014 :  Interval clearing of right perihilar infiltrate.   Recent Labs Lab 04/19/14 1246  NA 139  K 3.9  CL 100  CO2 27  BUN 11  CREATININE 0.8  GLUCOSE 85    Recent Labs Lab 04/19/14 1246  HGB 14.2  HCT 43.1  WBC 9.2  PLT 346.0     Lab Results  Component Value Date   TSH 1.900 04/12/2013     Lab Results  Component Value Date   PROBNP 230.6* 04/02/2014     Lab Results  Component Value Date   ESRSEDRATE 28* 04/19/2014           Assessment & Plan:

## 2014-05-06 NOTE — Telephone Encounter (Signed)
Called and spoke to pt. Pt questioning if her BP medication should be increased. Pt stated her BP is around 130's/70's advised pt that both systolic and diastolic numbers are within range. Pt verbalized understanding. Pt also stated that the abx she is taking is causing her to be nauseous. Advised pt to contact the provider that prescribed the medication. Pt verbalized understanding and denied any further questions or concerns at this time.

## 2014-05-06 NOTE — Assessment & Plan Note (Signed)
Flare with cough w/ AR /GERD triggers Needs PFT   Plan  Begin Delsym 2 teaspoons twice daily for cough Begin Tessalon 3 times daily for cough May use Tussionex at bedtime as needed for severe cough, may make you sleepy Begin Zyrtec 10 mg in the morning Begin Chlor-Trimeton 4 mg 2 at bedtime May add on Chlor-Trimeton 4 mg 1 every 4 hours as needed for drainage Using Nexium daily in the morning before you eat Begin Zantac 150 mg at bedtime GERD diet Use sips of water and sugarless candy to help avoid throat clearing and coughing Avoid all mint products and gum Follow-up with Dr. Melvyn Novas in 6 weeks with a pulmonary function test

## 2014-05-08 ENCOUNTER — Other Ambulatory Visit: Payer: Self-pay | Admitting: *Deleted

## 2014-05-08 ENCOUNTER — Telehealth: Payer: Self-pay | Admitting: Internal Medicine

## 2014-05-08 MED ORDER — NEBIVOLOL HCL 5 MG PO TABS: 5.0000 mg | ORAL_TABLET | Freq: Every day | ORAL | Status: DC

## 2014-05-08 NOTE — Telephone Encounter (Signed)
rx sent. Nothing further needed.   

## 2014-05-13 ENCOUNTER — Telehealth: Payer: Self-pay | Admitting: Internal Medicine

## 2014-05-13 ENCOUNTER — Ambulatory Visit (INDEPENDENT_AMBULATORY_CARE_PROVIDER_SITE_OTHER): Payer: Medicare Other | Admitting: Physician Assistant

## 2014-05-13 ENCOUNTER — Encounter: Payer: Self-pay | Admitting: Physician Assistant

## 2014-05-13 VITALS — BP 138/64 | HR 65 | Ht 65.5 in | Wt 117.0 lb

## 2014-05-13 DIAGNOSIS — I10 Essential (primary) hypertension: Secondary | ICD-10-CM

## 2014-05-13 DIAGNOSIS — R0609 Other forms of dyspnea: Secondary | ICD-10-CM

## 2014-05-13 DIAGNOSIS — R0600 Dyspnea, unspecified: Secondary | ICD-10-CM

## 2014-05-13 DIAGNOSIS — R0789 Other chest pain: Secondary | ICD-10-CM

## 2014-05-13 MED ORDER — NEBIVOLOL HCL 5 MG PO TABS: 5.0000 mg | ORAL_TABLET | Freq: Every day | ORAL | Status: DC

## 2014-05-13 NOTE — Assessment & Plan Note (Signed)
Chest pain is mostly related to when she has severe indigestion. Stress Myoview was normal on 04/30/14. Follow-up with Dr. Rayann Heman in 3 months.

## 2014-05-13 NOTE — Assessment & Plan Note (Signed)
Patient complains of dyspnea on exertion and fatigue with little activity. 2-D echo showed normal LV function EF 65-70% with grade 1 diastolic dysfunction. Some of this may be deconditioning but also related to her asthma. She is scheduled to have pulmonary function tests in the near future. Follow-up with pulmonary.

## 2014-05-13 NOTE — Progress Notes (Signed)
HPI: This is a 68 year old female patient with history of hypertension, hyperlipidemia, family history of CAD, severe GE RD, history of esophageal spasm, OAS intolerant of C Pap, and migraines. She had a recent admission in October with COPD and asthma exacerbation. She had some atypical chest pain with relieved with nitroglycerin. Troponins were negative, 2-D echo showed normal LV function, grade 1 diastolic dysfunction. She side Hinton Dyer done on 04/23/14 still complaining of atypical chest pain. Stress Myoview on 04/30/14 was normal EF 86% normal LV function. She was advised to follow-up with her GI and internal medicine M.D. for their etiology of chest pain.  Patient comes in today for follow-up. She continues to have chest pain when her indigestion is severe. She also was diagnosed with H. pylori and has trouble taking the antibiotic which causes her to be nauseous. She also complains of dyspnea on exertion with little activity. She is to have a pulmonary function test once her coughing ceases but that is not scheduled until the first of the year. She seems very anxious about her symptoms.  No Known Allergies   Current Outpatient Prescriptions  Medication Sig Dispense Refill  . acetaminophen (TYLENOL) 500 MG tablet Take 500 mg by mouth every 6 (six) hours as needed.    Marland Kitchen albuterol (PROVENTIL HFA;VENTOLIN HFA) 108 (90 BASE) MCG/ACT inhaler Inhale 2 puffs into the lungs every 4 (four) hours as needed for wheezing or shortness of breath. 1 Inhaler 0  . albuterol (PROVENTIL) (2.5 MG/3ML) 0.083% nebulizer solution Take 3 mLs (2.5 mg total) by nebulization every 4 (four) hours as needed for wheezing or shortness of breath. 150 mL 12  . amoxicillin (AMOXIL) 500 MG tablet Take 500 mg by mouth 2 (two) times daily.    Marland Kitchen atorvastatin (LIPITOR) 40 MG tablet TAKE ONE TABLET BY MOUTH ONE TIME DAILY  90 tablet 0  . benzonatate (TESSALON) 200 MG capsule Take 1 capsule (200 mg total) by mouth 3 (three)  times daily. 60 capsule 3  . chlorpheniramine-HYDROcodone (TUSSIONEX) 10-8 MG/5ML LQCR Take 5 mLs by mouth every 12 (twelve) hours as needed for cough. 115 mL 0  . clarithromycin (BIAXIN) 500 MG tablet Take 500 mg by mouth 2 (two) times daily.    . DULoxetine (CYMBALTA) 30 MG capsule Take 90 mg by mouth daily.     . ergocalciferol (VITAMIN D2) 50000 UNITS capsule Take 50,000 Units by mouth every Saturday.     . esomeprazole (NEXIUM) 40 MG capsule Take 40 mg by mouth daily at 12 noon.    . hydrochlorothiazide (MICROZIDE) 12.5 MG capsule Take 12.5 mg by mouth daily.     Marland Kitchen levothyroxine (SYNTHROID, LEVOTHROID) 112 MCG tablet Take 112-168 mcg by mouth daily before breakfast. Take 112 mcg daily, except on every Saturday takes 1 and 1/2 tablet = 168 mcg.    Marland Kitchen LORazepam (ATIVAN) 0.5 MG tablet Take 0.5 mg by mouth 2 (two) times daily as needed.     . nebivolol (BYSTOLIC) 5 MG tablet Take 1 tablet (5 mg total) by mouth daily. 30 tablet 0  . nitroGLYCERIN (NITROSTAT) 0.4 MG SL tablet Place 1 tablet (0.4 mg total) under the tongue every 5 (five) minutes as needed for chest pain. 20 tablet 0  . ranitidine (ZANTAC) 150 MG tablet Take 150 mg by mouth 2 (two) times daily.     No current facility-administered medications for this visit.    Past Medical History  Diagnosis Date  . GERD (gastroesophageal reflux disease)   .  Hypercholesteremia   . Hypothyroid   . Hypertension   . Depression   . Osteoporosis   . Anxiety   . Arthritis   . Sinusitis   . Tachycardia     a. suspected due to sinus tach. Event monitor/echo unremarkable.  . Sleep apnea     a. intolerant to CPAP.  . Migraine   . Pneumonia   . Esophageal spasm     a. pt reports prior GI study demonstrating this.    Past Surgical History  Procedure Laterality Date  . Cesarean section  1967  . Combined augmentation mammaplasty and abdominoplasty      Family History  Problem Relation Age of Onset  . Coronary artery disease    . CAD  Father     died at 7 - massive heart attack  . Heart disease Sister     had open heart surgery age 45  . Diabetes Sister   . Emphysema Father     smoked  . Asthma Brother   . Asthma Father   . CAD Brother     had open heart surgery at 70  . Kidney failure Brother   . Stroke Sister     History   Social History  . Marital Status: Married    Spouse Name: N/A    Number of Children: N/A  . Years of Education: N/A   Occupational History  . Not on file.   Social History Main Topics  . Smoking status: Never Smoker   . Smokeless tobacco: Never Used  . Alcohol Use: Yes     Comment: rare wine  . Drug Use: No  . Sexual Activity: Not on file   Other Topics Concern  . Not on file   Social History Narrative   Pt lives in [place] with spouse.  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    ROS: See history of present illness otherwise negative  BP 138/64 mmHg  Pulse 65  Ht 5' 5.5" (1.664 m)  Wt 117 lb (53.071 kg)  BMI 19.17 kg/m2  PHYSICAL EXAM: Well-nournished, in no acute distress. Neck: No JVD, HJR, Bruit, or thyroid enlargement  Lungs: No tachypnea, clear without wheezing, rales, or rhonchi  Cardiovascular: RRR, PMI not displaced, Normal S1 and S2, no murmurs, gallops, bruit, thrill, or heave.  Abdomen: BS normal. Soft without organomegaly, masses, lesions or tenderness.  Extremities: without cyanosis, clubbing or edema. Good distal pulses bilateral  SKin: Warm, no lesions or rashes   Musculoskeletal: No deformities  Neuro: no focal signs   Wt Readings from Last 3 Encounters:  05/03/14 115 lb 12.8 oz (52.527 kg)  04/29/14 109 lb (49.442 kg)  04/23/14 113 lb (51.256 kg)    Lab Results  Component Value Date   WBC 9.2 04/19/2014   HGB 14.2 04/19/2014   HCT 43.1 04/19/2014   PLT 346.0 04/19/2014   GLUCOSE 85 04/19/2014   CHOL 185 04/12/2013   TRIG 99 04/12/2013   HDL 52 04/12/2013   LDLCALC 113* 04/12/2013   ALT 17 04/02/2014   AST 16 04/02/2014     NA 139 04/19/2014   K 3.9 04/19/2014   CL 100 04/19/2014   CREATININE 0.8 04/19/2014   BUN 11 04/19/2014   CO2 27 04/19/2014   TSH 1.900 04/12/2013    EKG:not performed   Overall Impression:  Normal stress nuclear study.  LV Ejection Fraction: 86% (overestimated due to small heart size).  LV Wall Motion:  NL LV Function; NL Wall Motion  Sanda Klein, MD, [facility] [phone] office [phone] pager  2-D echo 04/2014 Study Conclusions  - Left ventricle: The cavity size was normal. Wall thickness was   normal. Systolic function was vigorous. The estimated ejection   fraction was in the range of 65% to 70%. Wall motion was normal;   there were no regional wall motion abnormalities. Doppler   parameters are consistent with abnormal left ventricular   relaxation (grade 1 diastolic dysfunction). Doppler parameters   are consistent with high ventricular filling pressure. - Left atrium: The atrium was mildly dilated.  Impressions:  - Vigorous LV function; grade 1 diastolic dysfunction; mild LAE;   trace MR.

## 2014-05-13 NOTE — Patient Instructions (Signed)
Your physician recommends that you continue on your current medications as directed. Please refer to the Current Medication list given to you today.  Your physician recommends that you schedule a follow-up appointment in: [place]

## 2014-05-13 NOTE — Telephone Encounter (Signed)
Spoke with pt. She reports [facility] still has not received her bystolic RX. Looking back into her chart it was not sent correctly. I have done so. Pt needed nothing further

## 2014-05-13 NOTE — Assessment & Plan Note (Signed)
BP stable.

## 2014-06-12 ENCOUNTER — Other Ambulatory Visit: Payer: Self-pay | Admitting: Internal Medicine

## 2014-06-14 HISTORY — PX: OTHER SURGICAL HISTORY: SHX169

## 2014-06-17 ENCOUNTER — Encounter: Payer: Self-pay | Admitting: Internal Medicine

## 2014-06-17 ENCOUNTER — Encounter: Payer: Medicare Other | Admitting: Internal Medicine

## 2014-06-17 ENCOUNTER — Ambulatory Visit (INDEPENDENT_AMBULATORY_CARE_PROVIDER_SITE_OTHER): Payer: Medicare Other | Admitting: Internal Medicine

## 2014-06-17 ENCOUNTER — Other Ambulatory Visit: Payer: Self-pay | Admitting: Internal Medicine

## 2014-06-17 VITALS — BP 126/68 | HR 60 | Temp 97.9°F | Ht 60.5 in | Wt 115.0 lb

## 2014-06-17 DIAGNOSIS — J45991 Cough variant asthma: Secondary | ICD-10-CM

## 2014-06-17 DIAGNOSIS — R0600 Dyspnea, unspecified: Secondary | ICD-10-CM

## 2014-06-17 LAB — PULMONARY FUNCTION TEST
DL/VA % pred: 103 %
DL/VA: 4.39 ml/min/mmHg/L
DLCO UNC % PRED: 79 %
DLCO unc: 15.02 ml/min/mmHg
FEF 25-75 PRE: 2.12 L/s
FEF 25-75 Post: 2.31 L/sec
FEF2575-%Change-Post: 9 %
FEF2575-%PRED-POST: 142 %
FEF2575-%Pred-Pre: 130 %
FEV1-%Change-Post: 2 %
FEV1-%Pred-Post: 89 %
FEV1-%Pred-Pre: 87 %
FEV1-POST: 1.67 L
FEV1-PRE: 1.63 L
FEV1FVC-%Change-Post: 1 %
FEV1FVC-%Pred-Pre: 111 %
FEV6-%Change-Post: 0 %
FEV6-%Pred-Post: 81 %
FEV6-%Pred-Pre: 81 %
FEV6-POST: 1.93 L
FEV6-PRE: 1.92 L
FEV6FVC-%PRED-POST: 105 %
FEV6FVC-%Pred-Pre: 105 %
FVC-%CHANGE-POST: 0 %
FVC-%Pred-Post: 77 %
FVC-%Pred-Pre: 77 %
FVC-Post: 1.93 L
FVC-Pre: 1.92 L
PRE FEV1/FVC RATIO: 85 %
Post FEV1/FVC ratio: 87 %
Post FEV6/FVC ratio: 100 %
Pre FEV6/FVC Ratio: 100 %
RV % PRED: 67 %
RV: 1.35 L
TLC % PRED: 83 %
TLC: 3.71 L

## 2014-06-17 MED ORDER — BUDESONIDE-FORMOTEROL FUMARATE 80-4.5 MCG/ACT IN AERO: INHALATION_SPRAY | RESPIRATORY_TRACT | Status: DC

## 2014-06-17 NOTE — Progress Notes (Signed)
PFT done today. 

## 2014-06-17 NOTE — Patient Instructions (Addendum)
If the event of any flare with breathing go ahead restart symbicort 80 Take 2 puffs first thing in am and then another 2 puffs about 12 hours later.   Change zantac to 150 mg taken after bfast and before bedtime perfectly regularly  GERD (REFLUX)  is an extremely common cause of respiratory symptoms just like yours , many times with no obvious heartburn at all.    It can be treated with medication, but also with lifestyle changes including avoidance of late meals, excessive alcohol, smoking cessation, and avoid fatty foods, chocolate, peppermint, colas, red wine, and acidic juices such as orange juice.  NO MINT OR MENTHOL PRODUCTS SO NO COUGH DROPS  USE SUGARLESS CANDY INSTEAD (Jolley ranchers or Stover's or Life Savers) or even ice chips will also do - the key is to swallow to prevent all throat clearing. NO OIL BASED VITAMINS - use powdered substitutes.    If you are satisfied with your treatment plan,  let your doctor know and he/she can either refill your medications or you can return here when your prescription runs out.     If in any way you are not 100% satisfied,  please tell us.  If 100% better, tell your friends!  Pulmonary follow up is as needed

## 2014-06-17 NOTE — Progress Notes (Signed)
Subjective:    Patient ID: Elizabeth Walker, female    DOB: [birth year],   MRN: [id number]    Brief patient profile:  35 yowf never smoker some problems with bad bronchitis all through childhood seemed better by HS with good ex tol then sinus problems year round around age 69  then saw Donneta Romberg around 2005 took shots seemed better x 3 years s any meds but seen in pulmonary clinic by [facility] for chronic cough June 2009 with completely nl spirometry and did not f/u or required any inhalers  then 2014 Jan then more persistent sinus problems, ha , coughing improved eventually after prolonged abx then Aug 2015 same thing > good abx ?Levaquin got better > then admit:  Admit date: 04/02/2014 Discharge date: 04/08/2014  Recommendations for Outpatient Follow-up:  1. F/u with PCP in 1 week. Recommend f/u CXR in 4-6 weeks to confirm resolution and no underlying malignancy after resolution of acute illness.  2. Repeat CBC and BMP to f/u hypokalemia, leukocytosis 3. Please refer patient to pulmonology for PFTs after resolution of acute illness 4. Patient to schedule f/u with GI regarding severe acid reflux as this may be contributing to wheezing 5. Cardiology office to call and schedule follow up appointment for possible stress test  Discharge Diagnoses:    CAP (community acquired pneumonia)   Essential hypertension  Hypothyroidism  PNA (pneumonia)  Hypokalemia  Asthma with acute exacerbation  History of present illness:  69 year old female with history of hypothyroidism, hypertension who presented with shortness of breath and cough. Her symptoms started approximately a week prior to admission and she was seen in urgent care and started on levofloxacin. She had worsening shortness of breath despite antibiotics with persistent fever to 102 the night prior to admission. She has been feeling somewhat weak and had been having diarrhea, nausea, and vomiting. X-ray in the emergency department demonstrated  possible worsening pneumonia of the right lower lobe.   Hospital Course:   Community acquired pneumonia which failed outpatient treatment with levofloxacin. She presented with shortness of breath and reported fever. She was afebrile in the ER and remained afebrile during her admission. CT scan confirmed a superior right lower lobe pneumonia. She was started on vancomycin and cefepime. Her chest x-ray after several days of antibiotics appeared improved. Her blood cultures remain no growth to date. Legionella and strep pneumo antigens were negative. She had already been tested for flu and was negative. She had some upper respiratory symptoms such as sneezing and rhinorrhea and sore throat which suggested she may have had either allergies or viral URI contributing to her persistent symptoms.   Wheezing with intractable cough secondary to possible acute asthma or COPD exacerbation. She did not have a previous history of COPD or asthma but she does have a history of secondhand smoke exposure. She was started on prednisone and DuoNeb, however ipratropium seemed to worsen her cough so this was discontinued. We did a trial of inhaled corticosteroid and long-acting beta agonist which also helped some, but at recommendation of pulmonologist, stopped these medications prior to outpatient PFTs. She required Tessalon Perles, guaifenesin with codeine, and even some morphine to help with cough. Very slowly her cough improved, but she continued to have wheeze. ECHO demonstrated preserved EF and grade 1 DD. She had no vascular congestion on CXR or LEE to suggest diastolic heart failure and a trial of lasix made no difference in her cough. Recommend that she followup with her primary care doctor for referral  for pulmonary function tests after she has completed antibiotics and a steroid taper.  Sore throat with cobblestoning of posterior oropharynx that appeared viral or allergic. She was given throat spray.  She was not tested for group A strep because she has been on antibiotics which would have adequately treated this infection. Given Rx for flonase.  Atypical chest pain: She developed some substernal chest pain with radiation to right scapula on 10/25. Although it was not associated with SOB, nausea, lightheadedness, diaphoresis, it did get better with NTG. ECG appeared generally unchanged from her previous. Telemetry demonstrated NSR. Troponins were negative and ECHO appeared stable from prior. She will f/u with cardiology for additional testing as outpatient. She does have some risk factors for ACS, including HTN, age, and tobacco exposure. She has severe GERD, however, and may have some esophageal spasm also which would have improved with NTG. Advised continued use of ASA and given Rx for NTG to use pending her cardiology follow up appointment.   Hypokalemia, resolved with supplementation.  Dehydration, resolved with IV fluids.  Diarrhea with vomiting. She did not have further diarrhea after admission and states this has been a chronic problem. She had a GI pathogen panel which was negative. She continued her probiotics. She became constipated and was given a bowel regimen resulting in several BMs on 10/25.  Hypertension, blood pressure was low normal initially. Her diuretics were resumed.  Constipation, she was given Colace, Senokot MiraLax and as needed bisacodyl.  Consultants:   None Procedures:   CT chest Antibiotics:   vanc 10/20 >> 10/25  Cefepime 10/20 >> 10/25  Doxycycline 10/25 >>    04/19/2014 1st  Pulmonary office visit/ Asja Frommer   Chief Complaint  Patient presents with  . Pulmonary Consult    Self referral. Pt states that she was dxed with PNA on 03/29/14- still having dull CP, SOB, and non prod cough. She states has no energy.  Also having right side back pain.   fatigue is main problem, fever gone, sore throat better Cough with Sense of pnds but no  excess or purulent sputum and not bothering her while sleeping Doe x > slow adl since admit Not clear either hfa or nebs helping any of her symptoms  >CXR showed cleared infiltrate  Labs with min elevated BNP ~230, ESR 28  Change losartan to bystolic   49/67/59 Follow up  Returns for follow up for cough  Seen last ov 2 weeks ago for pulmonary consult for cough.  Cough not change with cough at night.  She had CT chest angio which was neg for PE, showed bronchitic changes.  Has on/off wheezing   BNP was slightly elevated at 230 , recent echo showed gr 1 DD with nml EF.    06/17/2014 f/u ov/Javonne Dorko re:  Chief Complaint  Patient presents with  . Follow-up    pt still has sob, wheezing and cough; she had pft today  even on best days, coughs esp in am (not prematurely) yellow / green and nasal obst, fatigue  Last day felt good day Aug 2015  prettty good summer of 2015   F/u with ent [facility]  breo am of ov   No obvious day to day or daytime variabilty or assoc chronic cough or cp or chest tightness, subjective wheeze overt sinus or hb symptoms. No unusual exp hx or h/o childhood pna/ asthma or knowledge of premature birth.  Sleeping ok without nocturnal  or early am exacerbation  of respiratory  c/o's or need for noct saba. Also denies any obvious fluctuation of symptoms with weather or environmental changes or other aggravating or alleviating factors except as outlined above   Current Medications, Allergies, Complete Past Medical History, Past Surgical History, Family History, and Social History were reviewed in [employer] record.  ROS  The following are not active complaints unless bolded sore throat, dysphagia, dental problems, itching, sneezing,  nasal congestion or excess/ purulent secretions, ear ache,   fever, chills, sweats, unintended wt loss, pleuritic or exertional cp, hemoptysis,  orthopnea pnd or leg swelling, presyncope, palpitations, heartburn, abdominal  pain, anorexia, nausea, vomiting, diarrhea  or change in bowel or urinary habits, change in stools or urine, dysuria,hematuria,  rash, arthralgias, visual complaints, headache, numbness weakness or ataxia or problems with walking or coordination,  change in mood/affect or memory.            Objective:   Physical Exam  amb wf nad  Wt Readings from Last 3 Encounters:  06/17/14 115 lb (52.164 kg)  05/13/14 117 lb (53.071 kg)  05/03/14 115 lb 12.8 oz (52.527 kg)    Vital signs reviewed    Vital signs reviewed   HEENT: nl dentition, turbinates, and orophanx. Nl external ear canals without cough reflex   NECK :  without JVD/Nodes/TM/ nl carotid upstrokes bilaterally   LUNGS: no acc muscle use, clear to A and P bilaterally without cough on insp or exp maneuvers   CV:  RRR  no s3 or murmur or increase in P2, no edema   ABD:  soft and nontender with nl excursion in the supine position. No bruits or organomegaly, bowel sounds nl  MS:  warm without deformities, calf tenderness, cyanosis or clubbing  SKIN: warm and dry without lesions    NEURO:  alert, approp, no deficits    CXR  04/19/2014 :  Interval clearing of right perihilar infiltrate.   Recent Labs Lab 04/19/14 1246  NA 139  K 3.9  CL 100  CO2 27  BUN 11  CREATININE 0.8  GLUCOSE 85    Recent Labs Lab 04/19/14 1246  HGB 14.2  HCT 43.1  WBC 9.2  PLT 346.0     Lab Results  Component Value Date   TSH 1.900 04/12/2013     Lab Results  Component Value Date   PROBNP 230.6* 04/02/2014     Lab Results  Component Value Date   ESRSEDRATE 28* 04/19/2014           Assessment & Plan:

## 2014-06-18 ENCOUNTER — Encounter: Payer: Self-pay | Admitting: Internal Medicine

## 2014-06-18 DIAGNOSIS — J45991 Cough variant asthma: Secondary | ICD-10-CM | POA: Insufficient documentation

## 2014-06-18 DIAGNOSIS — J301 Allergic rhinitis due to pollen: Secondary | ICD-10-CM | POA: Diagnosis not present

## 2014-06-18 DIAGNOSIS — J3089 Other allergic rhinitis: Secondary | ICD-10-CM | POA: Diagnosis not present

## 2014-06-18 NOTE — Assessment & Plan Note (Signed)
pfts wnl 06/17/14 including fef 2575 p BREO in am   ddx is cough variant asthma vs  Classic Upper airway cough syndrome, so named because it's frequently impossible to sort out how much is  CR/sinusitis with freq throat clearing (which can be related to primary GERD)   vs  causing  secondary (" extra esophageal")  GERD from wide swings in gastric pressure that occur with throat clearing, often  promoting self use of mint and menthol lozenges that reduce the lower esophageal sphincter tone and exacerbate the problem further in a cyclical fashion.   These are the same pts (now being labeled as having "irritable larynx syndrome" by some cough centers) who not infrequently have a history of having failed to tolerate ace inhibitors,  dry powder inhalers or biphosphonates or report having atypical reflux symptoms that don't respond to standard doses of PPI , and are easily confused as having aecopd or asthma flares by even experienced allergists/ pulmonologists.   To test this hypothesis rec she stop breo (dpi aggravate the cough which may be the case here) and just restart symbicort 80 2bid at the firs sign of any  Cough or sob regardless of cause   The proper method of use, as well as anticipated side effects, of a metered-dose inhaler are discussed and demonstrated to the patient. Improved effectiveness after extensive coaching during this visit to a level of approximately  90%

## 2014-06-19 DIAGNOSIS — J45909 Unspecified asthma, uncomplicated: Secondary | ICD-10-CM | POA: Diagnosis not present

## 2014-06-20 DIAGNOSIS — J301 Allergic rhinitis due to pollen: Secondary | ICD-10-CM | POA: Diagnosis not present

## 2014-06-20 DIAGNOSIS — J3089 Other allergic rhinitis: Secondary | ICD-10-CM | POA: Diagnosis not present

## 2014-06-26 ENCOUNTER — Institutional Professional Consult (permissible substitution): Payer: Medicare Other | Admitting: Internal Medicine

## 2014-06-26 DIAGNOSIS — R351 Nocturia: Secondary | ICD-10-CM | POA: Diagnosis not present

## 2014-06-26 DIAGNOSIS — N3946 Mixed incontinence: Secondary | ICD-10-CM | POA: Diagnosis not present

## 2014-06-26 DIAGNOSIS — R350 Frequency of micturition: Secondary | ICD-10-CM | POA: Diagnosis not present

## 2014-06-26 DIAGNOSIS — R3912 Poor urinary stream: Secondary | ICD-10-CM | POA: Diagnosis not present

## 2014-06-28 DIAGNOSIS — J301 Allergic rhinitis due to pollen: Secondary | ICD-10-CM | POA: Diagnosis not present

## 2014-06-28 DIAGNOSIS — J3089 Other allergic rhinitis: Secondary | ICD-10-CM | POA: Diagnosis not present

## 2014-07-02 DIAGNOSIS — J3081 Allergic rhinitis due to animal (cat) (dog) hair and dander: Secondary | ICD-10-CM | POA: Diagnosis not present

## 2014-07-02 DIAGNOSIS — J301 Allergic rhinitis due to pollen: Secondary | ICD-10-CM | POA: Diagnosis not present

## 2014-07-02 DIAGNOSIS — G43109 Migraine with aura, not intractable, without status migrainosus: Secondary | ICD-10-CM | POA: Diagnosis not present

## 2014-07-02 DIAGNOSIS — J3089 Other allergic rhinitis: Secondary | ICD-10-CM | POA: Diagnosis not present

## 2014-07-02 DIAGNOSIS — H918X2 Other specified hearing loss, left ear: Secondary | ICD-10-CM | POA: Diagnosis not present

## 2014-07-02 DIAGNOSIS — G4733 Obstructive sleep apnea (adult) (pediatric): Secondary | ICD-10-CM | POA: Diagnosis not present

## 2014-07-04 DIAGNOSIS — J3089 Other allergic rhinitis: Secondary | ICD-10-CM | POA: Diagnosis not present

## 2014-07-04 DIAGNOSIS — J301 Allergic rhinitis due to pollen: Secondary | ICD-10-CM | POA: Diagnosis not present

## 2014-07-08 NOTE — Progress Notes (Signed)
This encounter was created in error - please disregard.

## 2014-07-09 DIAGNOSIS — J45901 Unspecified asthma with (acute) exacerbation: Secondary | ICD-10-CM | POA: Diagnosis not present

## 2014-07-09 DIAGNOSIS — J3089 Other allergic rhinitis: Secondary | ICD-10-CM | POA: Diagnosis not present

## 2014-07-09 DIAGNOSIS — J301 Allergic rhinitis due to pollen: Secondary | ICD-10-CM | POA: Diagnosis not present

## 2014-07-11 DIAGNOSIS — J3089 Other allergic rhinitis: Secondary | ICD-10-CM | POA: Diagnosis not present

## 2014-07-11 DIAGNOSIS — J301 Allergic rhinitis due to pollen: Secondary | ICD-10-CM | POA: Diagnosis not present

## 2014-07-22 DIAGNOSIS — J3089 Other allergic rhinitis: Secondary | ICD-10-CM | POA: Diagnosis not present

## 2014-07-22 DIAGNOSIS — J301 Allergic rhinitis due to pollen: Secondary | ICD-10-CM | POA: Diagnosis not present

## 2014-07-23 DIAGNOSIS — G4733 Obstructive sleep apnea (adult) (pediatric): Secondary | ICD-10-CM | POA: Diagnosis not present

## 2014-07-25 DIAGNOSIS — J3089 Other allergic rhinitis: Secondary | ICD-10-CM | POA: Diagnosis not present

## 2014-07-25 DIAGNOSIS — J301 Allergic rhinitis due to pollen: Secondary | ICD-10-CM | POA: Diagnosis not present

## 2014-07-31 DIAGNOSIS — J3089 Other allergic rhinitis: Secondary | ICD-10-CM | POA: Diagnosis not present

## 2014-07-31 DIAGNOSIS — J301 Allergic rhinitis due to pollen: Secondary | ICD-10-CM | POA: Diagnosis not present

## 2014-08-05 DIAGNOSIS — J301 Allergic rhinitis due to pollen: Secondary | ICD-10-CM | POA: Diagnosis not present

## 2014-08-05 DIAGNOSIS — J3089 Other allergic rhinitis: Secondary | ICD-10-CM | POA: Diagnosis not present

## 2014-08-07 DIAGNOSIS — Z1231 Encounter for screening mammogram for malignant neoplasm of breast: Secondary | ICD-10-CM | POA: Diagnosis not present

## 2014-08-07 DIAGNOSIS — J3089 Other allergic rhinitis: Secondary | ICD-10-CM | POA: Diagnosis not present

## 2014-08-07 DIAGNOSIS — J301 Allergic rhinitis due to pollen: Secondary | ICD-10-CM | POA: Diagnosis not present

## 2014-08-09 DIAGNOSIS — J45901 Unspecified asthma with (acute) exacerbation: Secondary | ICD-10-CM | POA: Diagnosis not present

## 2014-08-12 DIAGNOSIS — J3089 Other allergic rhinitis: Secondary | ICD-10-CM | POA: Diagnosis not present

## 2014-08-12 DIAGNOSIS — J301 Allergic rhinitis due to pollen: Secondary | ICD-10-CM | POA: Diagnosis not present

## 2014-08-14 DIAGNOSIS — J3089 Other allergic rhinitis: Secondary | ICD-10-CM | POA: Diagnosis not present

## 2014-08-14 DIAGNOSIS — J301 Allergic rhinitis due to pollen: Secondary | ICD-10-CM | POA: Diagnosis not present

## 2014-08-20 DIAGNOSIS — J3089 Other allergic rhinitis: Secondary | ICD-10-CM | POA: Diagnosis not present

## 2014-08-20 DIAGNOSIS — J301 Allergic rhinitis due to pollen: Secondary | ICD-10-CM | POA: Diagnosis not present

## 2014-08-22 DIAGNOSIS — Z23 Encounter for immunization: Secondary | ICD-10-CM | POA: Diagnosis not present

## 2014-08-22 DIAGNOSIS — I10 Essential (primary) hypertension: Secondary | ICD-10-CM | POA: Diagnosis not present

## 2014-08-22 DIAGNOSIS — Z0000 Encounter for general adult medical examination without abnormal findings: Secondary | ICD-10-CM | POA: Diagnosis not present

## 2014-08-22 DIAGNOSIS — E559 Vitamin D deficiency, unspecified: Secondary | ICD-10-CM | POA: Diagnosis not present

## 2014-08-26 ENCOUNTER — Ambulatory Visit: Payer: Medicare Other | Admitting: Internal Medicine

## 2014-08-27 DIAGNOSIS — J3089 Other allergic rhinitis: Secondary | ICD-10-CM | POA: Diagnosis not present

## 2014-08-27 DIAGNOSIS — J301 Allergic rhinitis due to pollen: Secondary | ICD-10-CM | POA: Diagnosis not present

## 2014-08-29 DIAGNOSIS — G4733 Obstructive sleep apnea (adult) (pediatric): Secondary | ICD-10-CM | POA: Diagnosis not present

## 2014-08-29 DIAGNOSIS — I10 Essential (primary) hypertension: Secondary | ICD-10-CM | POA: Diagnosis not present

## 2014-08-29 DIAGNOSIS — J3089 Other allergic rhinitis: Secondary | ICD-10-CM | POA: Diagnosis not present

## 2014-08-29 DIAGNOSIS — J301 Allergic rhinitis due to pollen: Secondary | ICD-10-CM | POA: Diagnosis not present

## 2014-08-29 DIAGNOSIS — E785 Hyperlipidemia, unspecified: Secondary | ICD-10-CM | POA: Diagnosis not present

## 2014-08-29 DIAGNOSIS — M810 Age-related osteoporosis without current pathological fracture: Secondary | ICD-10-CM | POA: Diagnosis not present

## 2014-08-29 DIAGNOSIS — E039 Hypothyroidism, unspecified: Secondary | ICD-10-CM | POA: Diagnosis not present

## 2014-09-02 DIAGNOSIS — J3089 Other allergic rhinitis: Secondary | ICD-10-CM | POA: Diagnosis not present

## 2014-09-02 DIAGNOSIS — J301 Allergic rhinitis due to pollen: Secondary | ICD-10-CM | POA: Diagnosis not present

## 2014-09-02 DIAGNOSIS — M810 Age-related osteoporosis without current pathological fracture: Secondary | ICD-10-CM | POA: Diagnosis not present

## 2014-09-02 DIAGNOSIS — J3081 Allergic rhinitis due to animal (cat) (dog) hair and dander: Secondary | ICD-10-CM | POA: Diagnosis not present

## 2014-09-04 DIAGNOSIS — J3081 Allergic rhinitis due to animal (cat) (dog) hair and dander: Secondary | ICD-10-CM | POA: Diagnosis not present

## 2014-09-04 DIAGNOSIS — J3089 Other allergic rhinitis: Secondary | ICD-10-CM | POA: Diagnosis not present

## 2014-09-04 DIAGNOSIS — J301 Allergic rhinitis due to pollen: Secondary | ICD-10-CM | POA: Diagnosis not present

## 2014-09-07 DIAGNOSIS — J45901 Unspecified asthma with (acute) exacerbation: Secondary | ICD-10-CM | POA: Diagnosis not present

## 2014-09-09 DIAGNOSIS — J301 Allergic rhinitis due to pollen: Secondary | ICD-10-CM | POA: Diagnosis not present

## 2014-09-09 DIAGNOSIS — J3081 Allergic rhinitis due to animal (cat) (dog) hair and dander: Secondary | ICD-10-CM | POA: Diagnosis not present

## 2014-09-09 DIAGNOSIS — J3089 Other allergic rhinitis: Secondary | ICD-10-CM | POA: Diagnosis not present

## 2014-09-12 DIAGNOSIS — J301 Allergic rhinitis due to pollen: Secondary | ICD-10-CM | POA: Diagnosis not present

## 2014-09-12 DIAGNOSIS — J3089 Other allergic rhinitis: Secondary | ICD-10-CM | POA: Diagnosis not present

## 2014-09-14 DIAGNOSIS — G4733 Obstructive sleep apnea (adult) (pediatric): Secondary | ICD-10-CM | POA: Diagnosis not present

## 2014-09-16 DIAGNOSIS — J301 Allergic rhinitis due to pollen: Secondary | ICD-10-CM | POA: Diagnosis not present

## 2014-09-16 DIAGNOSIS — J3089 Other allergic rhinitis: Secondary | ICD-10-CM | POA: Diagnosis not present

## 2014-09-19 DIAGNOSIS — J301 Allergic rhinitis due to pollen: Secondary | ICD-10-CM | POA: Diagnosis not present

## 2014-09-19 DIAGNOSIS — J3089 Other allergic rhinitis: Secondary | ICD-10-CM | POA: Diagnosis not present

## 2014-09-21 DIAGNOSIS — J069 Acute upper respiratory infection, unspecified: Secondary | ICD-10-CM | POA: Diagnosis not present

## 2014-09-25 DIAGNOSIS — M79643 Pain in unspecified hand: Secondary | ICD-10-CM | POA: Diagnosis not present

## 2014-09-25 DIAGNOSIS — M1812 Unilateral primary osteoarthritis of first carpometacarpal joint, left hand: Secondary | ICD-10-CM | POA: Diagnosis not present

## 2014-09-25 DIAGNOSIS — M150 Primary generalized (osteo)arthritis: Secondary | ICD-10-CM | POA: Diagnosis not present

## 2014-09-25 DIAGNOSIS — M19041 Primary osteoarthritis, right hand: Secondary | ICD-10-CM | POA: Diagnosis not present

## 2014-09-25 DIAGNOSIS — J301 Allergic rhinitis due to pollen: Secondary | ICD-10-CM | POA: Diagnosis not present

## 2014-09-25 DIAGNOSIS — J3089 Other allergic rhinitis: Secondary | ICD-10-CM | POA: Diagnosis not present

## 2014-09-26 DIAGNOSIS — K222 Esophageal obstruction: Secondary | ICD-10-CM | POA: Diagnosis not present

## 2014-09-26 DIAGNOSIS — K219 Gastro-esophageal reflux disease without esophagitis: Secondary | ICD-10-CM | POA: Diagnosis not present

## 2014-09-26 DIAGNOSIS — Z86010 Personal history of colonic polyps: Secondary | ICD-10-CM | POA: Diagnosis not present

## 2014-09-27 DIAGNOSIS — J019 Acute sinusitis, unspecified: Secondary | ICD-10-CM | POA: Diagnosis not present

## 2014-09-27 DIAGNOSIS — J3089 Other allergic rhinitis: Secondary | ICD-10-CM | POA: Diagnosis not present

## 2014-09-27 DIAGNOSIS — J209 Acute bronchitis, unspecified: Secondary | ICD-10-CM | POA: Diagnosis not present

## 2014-09-27 DIAGNOSIS — J3081 Allergic rhinitis due to animal (cat) (dog) hair and dander: Secondary | ICD-10-CM | POA: Diagnosis not present

## 2014-09-27 DIAGNOSIS — H1045 Other chronic allergic conjunctivitis: Secondary | ICD-10-CM | POA: Diagnosis not present

## 2014-09-27 DIAGNOSIS — J4520 Mild intermittent asthma, uncomplicated: Secondary | ICD-10-CM | POA: Diagnosis not present

## 2014-09-27 DIAGNOSIS — J301 Allergic rhinitis due to pollen: Secondary | ICD-10-CM | POA: Diagnosis not present

## 2014-10-02 DIAGNOSIS — J301 Allergic rhinitis due to pollen: Secondary | ICD-10-CM | POA: Diagnosis not present

## 2014-10-02 DIAGNOSIS — J3089 Other allergic rhinitis: Secondary | ICD-10-CM | POA: Diagnosis not present

## 2014-10-07 ENCOUNTER — Encounter: Payer: Self-pay | Admitting: Endocrinology

## 2014-10-07 ENCOUNTER — Ambulatory Visit (INDEPENDENT_AMBULATORY_CARE_PROVIDER_SITE_OTHER): Payer: Medicare Other | Admitting: Endocrinology

## 2014-10-07 VITALS — BP 128/64 | HR 55 | Temp 97.9°F | Resp 14 | Ht 60.5 in | Wt 120.0 lb

## 2014-10-07 DIAGNOSIS — E038 Other specified hypothyroidism: Secondary | ICD-10-CM

## 2014-10-07 DIAGNOSIS — E063 Autoimmune thyroiditis: Secondary | ICD-10-CM

## 2014-10-07 NOTE — Progress Notes (Signed)
Patient ID: Elizabeth Walker, female   DOB: [birth year], 69 y.o.   MRN: [id number]  Reason for Appointment:  Hypothyroidism, followup visit    History of Present Illness:    The hypothyroidism was first diagnosed  several years ago and was having significant symptoms at onset Complaints are reported by the patient now are none except for occasional fatigue but no new cold sensitivity or skin changes        The treatments that the patient has taken include Synthroid 112 mcg, 1 tablet daily Her thyroid dose was not changed on her last visit but she has not been in for follow-up for over a year now She saw her PCP about 5 weeks ago and she was told that her thyroid level was slightly high and she was asked to drop the extra half tablet of Synthroid she was taking once a week.  Lab results are not available       However the patient is complaining of loss of energy for at least the last 2 months.  She has difficulty losing weight She also continues to have hair loss which has been going on for 2 years  Compliance with the medical regimen has been as prescribed with taking the tablet in the morning before breakfast.  TSH levels currently available as follows:  Lab Results  Component Value Date   TSH 1.900 04/12/2013   TSH 0.04* 04/22/2011   FREET4 1.47 04/12/2013     HYPERLIPIDEMIA: She is on lipid-lowering drugs for several years started by her previous primary care physician because of hypercholesterolemia and fairly strong family history of CAD. She also has hypertension but no diabetes She has gone up to 40 mg of Lipitor as suggested on her previous visit but recent labs are not available  Lab Results  Component Value Date   CHOL 185 04/12/2013   HDL 52 04/12/2013   LDLCALC 113* 04/12/2013   TRIG 99 04/12/2013   CHOLHDL 3.6 04/12/2013        Medication List       This list is accurate as of: 10/07/14  4:51 PM.  Always use your most recent med list.                acetaminophen 500 MG tablet  Commonly known as:  TYLENOL  Take 500 mg by mouth every 6 (six) hours as needed.     atorvastatin 40 MG tablet  Commonly known as:  LIPITOR  TAKE ONE TABLET BY MOUTH ONE TIME DAILY     budesonide-formoterol 80-4.5 MCG/ACT inhaler  Commonly known as:  SYMBICORT  Take 2 puffs first thing in am and then another 2 puffs about 12 hours later.     BYSTOLIC 5 MG tablet  Generic drug:  nebivolol  TAKE ONE TABLET BY MOUTH ONE TIME DAILY     chlorpheniramine-HYDROcodone 10-8 MG/5ML Lqcr  Commonly known as:  TUSSIONEX  Take 5 mLs by mouth every 12 (twelve) hours as needed for cough.     ergocalciferol 50000 UNITS capsule  Commonly known as:  VITAMIN D2  Take 50,000 Units by mouth every Saturday.     hydrochlorothiazide 12.5 MG capsule  Commonly known as:  MICROZIDE  Take 12.5 mg by mouth daily.     levothyroxine 112 MCG tablet  Commonly known as:  SYNTHROID, LEVOTHROID  Take 112-168 mcg by mouth daily before breakfast. Take 112 mcg daily, except on every Saturday takes 1 and 1/2 tablet = 168 mcg.  LORazepam 0.5 MG tablet  Commonly known as:  ATIVAN  Take 0.5 mg by mouth 2 (two) times daily as needed.     nitroGLYCERIN 0.4 MG SL tablet  Commonly known as:  NITROSTAT  Place 1 tablet (0.4 mg total) under the tongue every 5 (five) minutes as needed for chest pain.     ranitidine 150 MG tablet  Commonly known as:  ZANTAC  Take 150 mg by mouth 2 (two) times daily.        Allergies: No Known Allergies  Past Medical History  Diagnosis Date  . GERD (gastroesophageal reflux disease)   . Hypercholesteremia   . Hypothyroid   . Hypertension   . Depression   . Osteoporosis   . Anxiety   . Arthritis   . Sinusitis   . Tachycardia     a. suspected due to sinus tach. Event monitor/echo unremarkable.  . Sleep apnea     a. intolerant to CPAP.  . Migraine   . Pneumonia   . Esophageal spasm     a. pt reports prior GI study demonstrating this.     Past Surgical History  Procedure Laterality Date  . Cesarean section  1967  . Combined augmentation mammaplasty and abdominoplasty      Family History  Problem Relation Age of Onset  . Coronary artery disease    . CAD Father     died at 44 - massive heart attack  . Heart disease Sister     had open heart surgery age 76  . Diabetes Sister   . Emphysema Father     smoked  . Asthma Brother   . Asthma Father   . CAD Brother     had open heart surgery at 67  . Kidney failure Brother   . Stroke Sister     Social History:  reports that she has never smoked. She has never used smokeless tobacco. She reports that she drinks alcohol. She reports that she does not use illicit drugs.  REVIEW Of SYSTEMS:  She has difficulty with gradual weight gain  Wt Readings from Last 3 Encounters:  10/07/14 120 lb (54.432 kg)  06/17/14 115 lb (52.164 kg)  05/13/14 117 lb (53.071 kg)    No history of diabetes    She tends to get hot more easily   Examination:   BP 128/64 mmHg  Pulse 55  Temp(Src) 97.9 F (36.6 C)  Resp 14  Ht 5' 0.5" (1.537 m)  Wt 120 lb (54.432 kg)  BMI 23.04 kg/m2  SpO2 98%   GENERAL APPEARANCE:  she looks well.    No puffiness of face or peripheral edema        NECK: no thyromegaly.          NEUROLOGIC EXAM: DTRs 2+ bilaterally at biceps with normal relaxation.    Assessments   Hypothyroidism, long-standing with somewhat variable dosage regimen in the past She has not been seen for over a year and now is concerned about getting her thyroid levels adequately adjusted Not clear what her TSH was with PCP last month but her dose has been reduced by half tablet weekly   I have subjectively she feels still significantly fatigued and her PCP has no explanation for this She has not been tried on Armour Thyroid before and she is interested in trying this for at least 30 days to see if it will  help her energy level  Hypercholesterolemia: Followed by PCP     Treatment:  To check TSH today and decide on dosage of Armour Thyroid instead of Synthroid   Oluwadarasimi Redmon 10/07/2014, 4:51 PM   Addendum: TSH still low, will need equivalent of 100 g of levothyroxine, will give her prescription for 90 g of Armour Thyroid to take 5 days a week with half tablet twice a week   Lab Results  Component Value Date   TSH 0.17* 10/07/2014

## 2014-10-08 DIAGNOSIS — B001 Herpesviral vesicular dermatitis: Secondary | ICD-10-CM | POA: Diagnosis not present

## 2014-10-08 DIAGNOSIS — J45901 Unspecified asthma with (acute) exacerbation: Secondary | ICD-10-CM | POA: Diagnosis not present

## 2014-10-08 LAB — TSH: TSH: 0.17 u[IU]/mL — ABNORMAL LOW (ref 0.35–4.50)

## 2014-10-08 LAB — T4, FREE: FREE T4: 1.37 ng/dL (ref 0.60–1.60)

## 2014-10-08 NOTE — Progress Notes (Signed)
Quick Note:  Thyroid level is still high, will need equivalent of 100 g of levothyroxine, will need to give her prescription for 90 g of Armour Thyroid to take 5 days a week with half tablet twice a week. Follow-up in 6 weeks as scheduled with repeat labs ______

## 2014-10-10 ENCOUNTER — Other Ambulatory Visit: Payer: Self-pay | Admitting: *Deleted

## 2014-10-10 ENCOUNTER — Telehealth: Payer: Self-pay | Admitting: Endocrinology

## 2014-10-10 DIAGNOSIS — J3089 Other allergic rhinitis: Secondary | ICD-10-CM | POA: Diagnosis not present

## 2014-10-10 DIAGNOSIS — J301 Allergic rhinitis due to pollen: Secondary | ICD-10-CM | POA: Diagnosis not present

## 2014-10-10 MED ORDER — THYROID 90 MG PO TABS: ORAL_TABLET | ORAL | Status: DC

## 2014-10-10 NOTE — Telephone Encounter (Signed)
Patient ask if someone would call her with her lab results

## 2014-10-15 DIAGNOSIS — J3089 Other allergic rhinitis: Secondary | ICD-10-CM | POA: Diagnosis not present

## 2014-10-15 DIAGNOSIS — J301 Allergic rhinitis due to pollen: Secondary | ICD-10-CM | POA: Diagnosis not present

## 2014-10-23 DIAGNOSIS — J301 Allergic rhinitis due to pollen: Secondary | ICD-10-CM | POA: Diagnosis not present

## 2014-10-23 DIAGNOSIS — J3089 Other allergic rhinitis: Secondary | ICD-10-CM | POA: Diagnosis not present

## 2014-10-24 ENCOUNTER — Telehealth: Payer: Self-pay | Admitting: Endocrinology

## 2014-10-24 NOTE — Telephone Encounter (Signed)
Insurance will not pay for new thyroid med it is going to need PA  She did not remember the number to call

## 2014-10-24 NOTE — Telephone Encounter (Signed)
She's talking about the Armour Thyroid, PA or?

## 2014-10-24 NOTE — Telephone Encounter (Signed)
Need PA for armour

## 2014-10-25 NOTE — Telephone Encounter (Signed)
PA sent for the armour thyroid.

## 2014-10-30 DIAGNOSIS — J3089 Other allergic rhinitis: Secondary | ICD-10-CM | POA: Diagnosis not present

## 2014-10-30 DIAGNOSIS — J301 Allergic rhinitis due to pollen: Secondary | ICD-10-CM | POA: Diagnosis not present

## 2014-11-06 DIAGNOSIS — J301 Allergic rhinitis due to pollen: Secondary | ICD-10-CM | POA: Diagnosis not present

## 2014-11-06 DIAGNOSIS — J3089 Other allergic rhinitis: Secondary | ICD-10-CM | POA: Diagnosis not present

## 2014-11-07 DIAGNOSIS — J45901 Unspecified asthma with (acute) exacerbation: Secondary | ICD-10-CM | POA: Diagnosis not present

## 2014-11-07 DIAGNOSIS — H905 Unspecified sensorineural hearing loss: Secondary | ICD-10-CM | POA: Diagnosis not present

## 2014-11-12 DIAGNOSIS — H25013 Cortical age-related cataract, bilateral: Secondary | ICD-10-CM | POA: Diagnosis not present

## 2014-11-14 DIAGNOSIS — J3089 Other allergic rhinitis: Secondary | ICD-10-CM | POA: Diagnosis not present

## 2014-11-14 DIAGNOSIS — J301 Allergic rhinitis due to pollen: Secondary | ICD-10-CM | POA: Diagnosis not present

## 2014-11-18 DIAGNOSIS — J301 Allergic rhinitis due to pollen: Secondary | ICD-10-CM | POA: Diagnosis not present

## 2014-11-18 DIAGNOSIS — J3089 Other allergic rhinitis: Secondary | ICD-10-CM | POA: Diagnosis not present

## 2014-11-18 DIAGNOSIS — G4733 Obstructive sleep apnea (adult) (pediatric): Secondary | ICD-10-CM | POA: Diagnosis not present

## 2014-11-20 DIAGNOSIS — J019 Acute sinusitis, unspecified: Secondary | ICD-10-CM | POA: Diagnosis not present

## 2014-11-20 DIAGNOSIS — H1045 Other chronic allergic conjunctivitis: Secondary | ICD-10-CM | POA: Diagnosis not present

## 2014-11-20 DIAGNOSIS — J3081 Allergic rhinitis due to animal (cat) (dog) hair and dander: Secondary | ICD-10-CM | POA: Diagnosis not present

## 2014-11-20 DIAGNOSIS — J301 Allergic rhinitis due to pollen: Secondary | ICD-10-CM | POA: Diagnosis not present

## 2014-11-20 DIAGNOSIS — J4520 Mild intermittent asthma, uncomplicated: Secondary | ICD-10-CM | POA: Diagnosis not present

## 2014-11-20 DIAGNOSIS — J3089 Other allergic rhinitis: Secondary | ICD-10-CM | POA: Diagnosis not present

## 2014-12-03 DIAGNOSIS — G4733 Obstructive sleep apnea (adult) (pediatric): Secondary | ICD-10-CM | POA: Diagnosis not present

## 2014-12-03 DIAGNOSIS — J45901 Unspecified asthma with (acute) exacerbation: Secondary | ICD-10-CM | POA: Diagnosis not present

## 2014-12-04 DIAGNOSIS — D3131 Benign neoplasm of right choroid: Secondary | ICD-10-CM | POA: Diagnosis not present

## 2014-12-04 DIAGNOSIS — H25811 Combined forms of age-related cataract, right eye: Secondary | ICD-10-CM | POA: Diagnosis not present

## 2014-12-04 DIAGNOSIS — H25813 Combined forms of age-related cataract, bilateral: Secondary | ICD-10-CM | POA: Diagnosis not present

## 2014-12-06 DIAGNOSIS — J3089 Other allergic rhinitis: Secondary | ICD-10-CM | POA: Diagnosis not present

## 2014-12-06 DIAGNOSIS — J301 Allergic rhinitis due to pollen: Secondary | ICD-10-CM | POA: Diagnosis not present

## 2014-12-08 DIAGNOSIS — J45901 Unspecified asthma with (acute) exacerbation: Secondary | ICD-10-CM | POA: Diagnosis not present

## 2014-12-09 ENCOUNTER — Other Ambulatory Visit: Payer: Self-pay

## 2014-12-09 DIAGNOSIS — J301 Allergic rhinitis due to pollen: Secondary | ICD-10-CM | POA: Diagnosis not present

## 2014-12-09 DIAGNOSIS — J3089 Other allergic rhinitis: Secondary | ICD-10-CM | POA: Diagnosis not present

## 2014-12-11 DIAGNOSIS — J301 Allergic rhinitis due to pollen: Secondary | ICD-10-CM | POA: Diagnosis not present

## 2014-12-11 DIAGNOSIS — J3089 Other allergic rhinitis: Secondary | ICD-10-CM | POA: Diagnosis not present

## 2014-12-12 DIAGNOSIS — H2511 Age-related nuclear cataract, right eye: Secondary | ICD-10-CM | POA: Diagnosis not present

## 2014-12-12 DIAGNOSIS — H268 Other specified cataract: Secondary | ICD-10-CM | POA: Diagnosis not present

## 2014-12-13 DIAGNOSIS — R05 Cough: Secondary | ICD-10-CM | POA: Diagnosis not present

## 2014-12-17 DIAGNOSIS — J45909 Unspecified asthma, uncomplicated: Secondary | ICD-10-CM | POA: Diagnosis not present

## 2014-12-17 DIAGNOSIS — R05 Cough: Secondary | ICD-10-CM | POA: Diagnosis not present

## 2014-12-17 DIAGNOSIS — J449 Chronic obstructive pulmonary disease, unspecified: Secondary | ICD-10-CM | POA: Diagnosis not present

## 2014-12-19 ENCOUNTER — Encounter: Payer: Self-pay | Admitting: Internal Medicine

## 2014-12-19 ENCOUNTER — Ambulatory Visit (INDEPENDENT_AMBULATORY_CARE_PROVIDER_SITE_OTHER): Payer: Medicare Other | Admitting: Internal Medicine

## 2014-12-19 DIAGNOSIS — J45991 Cough variant asthma: Secondary | ICD-10-CM | POA: Diagnosis not present

## 2014-12-19 MED ORDER — MOMETASONE FURO-FORMOTEROL FUM 100-5 MCG/ACT IN AERO: INHALATION_SPRAY | RESPIRATORY_TRACT | Status: DC

## 2014-12-19 MED ORDER — TRAMADOL HCL 50 MG PO TABS: ORAL_TABLET | ORAL | Status: DC

## 2014-12-19 MED ORDER — PREDNISONE 10 MG PO TABS: ORAL_TABLET | ORAL | Status: DC

## 2014-12-19 NOTE — Progress Notes (Signed)
Subjective:    Patient ID: Elizabeth Walker, female    DOB: 04-Oct-1945,   MRN: [id number]    Brief patient profile:  88 yowf never smoker some problems with bad bronchitis all through childhood seemed better by HS with good ex tol then sinus problems year round around age 69  then saw Donneta Romberg around 2005 took shots seemed better x 3 years s any meds but seen in pulmonary clinic by [facility] for chronic cough June 2009 with completely nl spirometry and did not f/u or required any inhalers  then 2014 Jan then more persistent sinus problems, ha , coughing improved eventually after prolonged abx then Aug 69  same thing > good abx ?Levaquin got better > then admit:  Admit date: 04/02/2014 Discharge date: 04/08/2014  Discharge Diagnoses:    CAP (community acquired pneumonia)   Essential hypertension  Hypothyroidism  PNA (pneumonia)  Hypokalemia  Asthma with acute exacerbation  04/19/2014 1st [facility] [place] [facility] office visit/ Shamia Uppal   Chief Complaint  Patient presents with  . Pulmonary Consult    Self referral. Pt states that she was dxed with PNA on 03/29/14- still having dull CP, SOB, and non prod cough. She states has no energy.  Also having right side back pain.   fatigue is main problem, fever gone, sore throat better Cough with Sense of pnds but no excess or purulent sputum and not bothering her while sleeping Doe x > slow adl since admit Not clear either hfa or nebs helping any of her symptoms  >CXR showed cleared infiltrate  Labs with min elevated BNP ~230, ESR 28  Change losartan to bystolic   28/00/34 Follow up  Returns for follow up for cough  Seen last ov 2 weeks ago for pulmonary consult for cough.  Cough not change with cough at night.  She had CT chest angio which was neg for PE, showed bronchitic changes.  Has on/off wheezing   BNP was slightly elevated at 230 , recent echo showed gr 1 DD with nml EF.    06/17/2014 f/u ov/Abdinasir Spadafore re:  Chief Complaint  Patient presents with    . Follow-up    pt still has sob, wheezing and cough; she had pft today  even on best days, coughs esp in am (not prematurely) yellow / green and nasal obst, fatigue  Last day felt good day Aug 2015  prettty good summer of 2015    took breo am of ov  rec If the event of any flare with breathing go ahead restart symbicort 80 Take 2 puffs first thing in am and then another 2 puffs about 12 hours later.  Change zantac to 150 mg taken after bfast and before bedtime perfectly regularly GERD diet      12/19/2014 f/u ov/Radames Mejorado re: chronic cough / asthmatic bronchitis refractory to [place] Complaint  Patient presents with  . Acute Visit    Pt states dxed with PNA x 71 days ago. She c/o wheezing, weakness, and cough with minimal white sputum.     astelin / nasonex only 50% lately more discolored > Levaquin > 3 days left  Cough daily x 6 months on Breo with doe x more than slow adls  Back on shots per Dr Donneta Romberg x 2 m   No obvious patterns in  day to day or daytime variabilty or assoc  cp or chest tightness,  overt   hb symptoms. No unusual exp hx or h/o childhood pna/ asthma or knowledge of premature  birth.  Sleeping poorly due to cough " only tussionex helps"  .- denies any obvious fluctuation of symptoms with weather or environmental changes or other aggravating or alleviating factors except as outlined above   Current Medications, Allergies, Complete Past Medical History, Past Surgical History, Family History, and Social History were reviewed in [employer] record.  ROS  The following are not active complaints unless bolded sore throat, dysphagia, dental problems, itching, sneezing,  nasal congestion or excess/ purulent secretions, ear ache,   fever, chills, sweats, unintended wt loss, pleuritic or exertional cp, hemoptysis,  orthopnea pnd or leg swelling, presyncope, palpitations, heartburn, abdominal pain, anorexia, nausea, vomiting, diarrhea  or change in bowel or  urinary habits, change in stools or urine, dysuria,hematuria,  rash, arthralgias, visual complaints, headache, numbness weakness or ataxia or problems with walking or coordination,  change in mood/affect or memory.            Objective:   Physical Exam  amb wf nad  12/19/2014          115  Wt Readings from Last 3 Encounters:  06/17/14 115 lb (52.164 kg)  05/13/14 117 lb (53.071 kg)  05/03/14 115 lb 12.8 oz (52.527 kg)    Vital signs reviewed       HEENT: nl dentition, turbinates, and orophanx. Nl external ear canals without cough reflex   NECK :  without JVD/Nodes/TM/ nl carotid upstrokes bilaterally   LUNGS: no acc muscle use,   pan exp rhonchi /exp cough bilaterally    CV:  RRR  no s3 or murmur or increase in P2, no edema   ABD:  soft and nontender with nl excursion in the supine position. No bruits or organomegaly, bowel sounds nl  MS:  warm without deformities, calf tenderness, cyanosis or clubbing  SKIN: warm and dry without lesions    NEURO:  alert, approp, no deficits     cxr reviewed from 12/17/14 mild hyperinflation with breast implants/ no as dz       Assessment & Plan:

## 2014-12-19 NOTE — Assessment & Plan Note (Addendum)
pfts wnl 06/17/14 including fef 2575 p BREO in am   Not sure how much primary asthma is present but whatever is driving her symptoms is difficult to control and refractory to [facility]  DDX of  difficult airways management all start with A and  include Adherence, Ace Inhibitors, Acid Reflux, Active Sinus Disease, Alpha 1 Antitripsin deficiency, Anxiety masquerading as Airways dz,  ABPA,  allergy(esp in young), Aspiration (esp in elderly), Adverse effects of meds,  Active smokers, A bunch of PE's (a small clot burden can't cause this syndrome unless there is already severe underlying pulm or vascular dz with poor reserve) plus two Bs  = Bronchiectasis and Beta blocker use..and one C= CHF.  Adherence is always the initial "prime suspect" and is a multilayered concern that requires a "trust but verify" approach in every patient - starting with knowing how to use medications, especially inhalers, correctly, keeping up with refills and understanding the fundamental difference between maintenance and prns vs those medications only taken for a very short course and then stopped and not refilled.  - very easily confused with details of care so need to keep it simple - The proper method of use, as well as anticipated side effects, of a metered-dose inhaler are discussed and demonstrated to the patient. Improved effectiveness after extensive coaching during this visit to a level of approximately  75%   ? Acid (or non-acid) GERD > always difficult to exclude as up to 75% of pts in some series report no assoc GI/ Heartburn symptoms> rec max (24h)  acid suppression and diet restrictions/ reviewed and instructions given in writing.   ? Sinus dz > sinus ct ordered  ? Allergy >defer shots to Dr Donneta Romberg but I told her I would try to take ownership of the rest of her meds and see if we can help sort out the source of all her non-specific symptoms. In meantime rec Prednisone 10 mg take  4 each am x 2 days,   2 each am x 2 days,   1 each am x 2 days and stop   ? Adverse effects of dpi > try off breo and just low dose dulera for now   I had an extended discussion with the patient and husband Ronalee Belts reviewing all relevant studies completed to date    Each maintenance medication was reviewed in detail including most importantly the difference between maintenance and prns and under what circumstances the prns are to be triggered using an action plan format that is not reflected in the computer generated alphabetically organized AVS.    Please see instructions for details which were reviewed in writing and the patient given a copy highlighting the part that I personally wrote and discussed at today's ov.   Total time = 25m review case with pt/ discussion/ counseling/ giving and going over instructions (see avs)

## 2014-12-19 NOTE — Patient Instructions (Addendum)
dymista one twice daily each daily   Stop breo, nasonex and astelin and I will let Dr Donneta Romberg know we will take "ownership" of all your resp meds except the shots until we can sort out the source of all your problems   For cough Take delsym two tsp every 12 hours and supplement if needed with  tramadol 50 mg up to 2 every 4 hours to suppress the urge to cough. Swallowing water or using ice chips/non mint and menthol containing candies (such as lifesavers or sugarless jolly ranchers) are also effective.  You should rest your voice and avoid activities that you know make you cough.  Once you have eliminated the cough for 3 straight days try reducing the tramadol first,  then the delsym as tolerated.    Plan A = automatic = dulera 100 Take 2 puffs first thing in am and then another 2 puffs about 12 hours later.   Prednisone 10 mg take  4 each am x 2 days,   2 each am x 2 days,  1 each am x 2 days and stop   Plan B = backup  For breathing  Only use your xo as a rescue medication to be used if you can't catch your breath by resting or doing a relaxed purse lip breathing pattern.  - The less you use it, the better it will work when you need it. - Ok to use up to 2 puffs  every 4 hours if you must but call for immediate appointment if use goes up over your usual need - Don't leave home without it !!  (think of it like the spare tire for your car)   Plan C = backup to plan B = albuterol neb up to  every 4 hours if needed  Omeprazole 40 mg Take 30-60 min before first meal of the day and zantac 300 mg at bedtime  GERD (REFLUX)  is an extremely common cause of respiratory symptoms just like yours , many times with no obvious heartburn at all.    It can be treated with medication, but also with lifestyle changes including elevation of the head of your bed (ideally with 6 inch  bed blocks),  Smoking cessation, avoidance of late meals, excessive alcohol, and avoid fatty foods, chocolate, peppermint,  colas, red wine, and acidic juices such as orange juice.  NO MINT OR MENTHOL PRODUCTS SO NO COUGH DROPS  USE SUGARLESS CANDY INSTEAD (Jolley ranchers or Stover's or Life Savers) or even ice chips will also do - the key is to swallow to prevent all throat clearing. NO OIL BASED VITAMINS - use powdered substitutes.  Please see patient coordinator before you leave today  to schedule sinus CT   Please schedule a follow up office visit in 2 weeks, sooner if needed and bring all medications / nebs/ inhalers

## 2014-12-20 ENCOUNTER — Other Ambulatory Visit: Payer: Self-pay | Admitting: Internal Medicine

## 2014-12-20 ENCOUNTER — Other Ambulatory Visit: Payer: Self-pay | Admitting: Endocrinology

## 2014-12-23 DIAGNOSIS — H2512 Age-related nuclear cataract, left eye: Secondary | ICD-10-CM | POA: Diagnosis not present

## 2014-12-24 ENCOUNTER — Other Ambulatory Visit: Payer: Medicare Other

## 2014-12-25 ENCOUNTER — Telehealth: Payer: Self-pay | Admitting: Internal Medicine

## 2014-12-25 ENCOUNTER — Ambulatory Visit (INDEPENDENT_AMBULATORY_CARE_PROVIDER_SITE_OTHER)
Admission: RE | Admit: 2014-12-25 | Discharge: 2014-12-25 | Disposition: A | Discharge status: Home / Self Care | Payer: Medicare Other | Source: Ambulatory Visit | Attending: Internal Medicine | Admitting: Internal Medicine

## 2014-12-25 DIAGNOSIS — J45991 Cough variant asthma: Secondary | ICD-10-CM

## 2014-12-25 DIAGNOSIS — J320 Chronic maxillary sinusitis: Secondary | ICD-10-CM | POA: Diagnosis not present

## 2014-12-25 DIAGNOSIS — J321 Chronic frontal sinusitis: Secondary | ICD-10-CM | POA: Diagnosis not present

## 2014-12-25 DIAGNOSIS — R059 Cough, unspecified: Secondary | ICD-10-CM

## 2014-12-25 DIAGNOSIS — R05 Cough: Secondary | ICD-10-CM

## 2014-12-25 MED ORDER — AMOXICILLIN-POT CLAVULANATE 875-125 MG PO TABS: 1.0000 | ORAL_TABLET | Freq: Two times a day (BID) | ORAL | Status: DC

## 2014-12-25 NOTE — Progress Notes (Signed)
Quick Note:  LMTCB on home and cell ______ 

## 2014-12-25 NOTE — Telephone Encounter (Signed)
Per CT sinus result: Result Note     Call patient : Study is c/w acute / chronic sinusitis rec Augmentin 875 mg take one pill twice daily X 10 days - take at breakfast and supper with large glass of water. It would help reduce the usual side effects (diarrhea and yeast infections) if you ate cultured yogurt at lunch. And ENT eval in 2 weeks unless 100% improved  --  I spoke with patient about results and she verbalized understanding and had no questions. RX sent in. Nothing further needed

## 2014-12-26 DIAGNOSIS — H2512 Age-related nuclear cataract, left eye: Secondary | ICD-10-CM | POA: Diagnosis not present

## 2014-12-26 DIAGNOSIS — H268 Other specified cataract: Secondary | ICD-10-CM | POA: Diagnosis not present

## 2014-12-27 ENCOUNTER — Ambulatory Visit: Payer: Medicare Other | Admitting: Endocrinology

## 2014-12-27 ENCOUNTER — Telehealth: Payer: Self-pay | Admitting: Internal Medicine

## 2014-12-27 MED ORDER — AZELASTINE-FLUTICASONE 137-50 MCG/ACT NA SUSP: 1.0000 | Freq: Two times a day (BID) | NASAL | Status: DC

## 2014-12-27 NOTE — Telephone Encounter (Signed)
Spoke with pt. She needed her dymista called into [facility]. I have done so. Nothing further needed

## 2015-01-02 ENCOUNTER — Ambulatory Visit (INDEPENDENT_AMBULATORY_CARE_PROVIDER_SITE_OTHER): Payer: Medicare Other | Admitting: Internal Medicine

## 2015-01-02 ENCOUNTER — Encounter: Payer: Self-pay | Admitting: Internal Medicine

## 2015-01-02 VITALS — BP 134/74 | HR 74 | Ht 60.0 in | Wt 116.0 lb

## 2015-01-02 DIAGNOSIS — J45991 Cough variant asthma: Secondary | ICD-10-CM

## 2015-01-02 DIAGNOSIS — R05 Cough: Secondary | ICD-10-CM | POA: Diagnosis not present

## 2015-01-02 DIAGNOSIS — J329 Chronic sinusitis, unspecified: Secondary | ICD-10-CM

## 2015-01-02 DIAGNOSIS — R059 Cough, unspecified: Secondary | ICD-10-CM

## 2015-01-02 MED ORDER — MEPERIDINE HCL 50 MG PO TABS: 50.0000 mg | ORAL_TABLET | ORAL | Status: DC | PRN

## 2015-01-02 MED ORDER — FLUTTER DEVI
Status: DC
Start: 2015-01-02 — End: 2020-12-31

## 2015-01-02 NOTE — Patient Instructions (Addendum)
Please see patient coordinator before you leave today  to schedule  ENT > Dr Victorio Palm group  Continue dysmista one twice daily   For cough mucinex dm 1200 mg every 12 hours but supplement with demerol 50 mg every 4 hours if needed along flutter valve.   Continue dulera 100 Take 2 puffs first thing in am and then another 2 puffs about 12 hours later.   Only use your albuterol neb as a  rescue medication to be used if you can't catch your breath by resting or doing a relaxed purse lip breathing pattern.  - The less you use it, the better it will work when you need it. - Ok to use up every 4 hours if you must  For now    Please schedule a follow up visit in 3 weeks but call sooner if needed with all medications in two separate bags

## 2015-01-02 NOTE — Progress Notes (Signed)
Subjective:    Patient ID: Elizabeth Walker, female    DOB: [birth year],   MRN: [id number]    Brief patient profile:  39 yowf never smoker some problems with bad bronchitis all through childhood seemed better by HS with good ex tol then sinus problems year round around age 69  then saw Donneta Romberg around 2005 took shots seemed better x 3 years s any meds but seen in pulmonary clinic by [facility] for chronic cough June 2009 with completely nl spirometry and did not f/u or required any inhalers  then 2014 Jan then more persistent sinus problems, ha , coughing improved eventually after prolonged abx then Aug 2015 same thing > good abx ?Levaquin got better > then admit:  Admit date: 04/02/2014 Discharge date: 04/08/2014  Discharge Diagnoses:    CAP (community acquired pneumonia)   Essential hypertension  Hypothyroidism  PNA (pneumonia)  Hypokalemia  Asthma with acute exacerbation  04/19/2014 1st [facility] office visit/ Brianne Maina   Chief Complaint  Patient presents with  . Pulmonary Consult    Self referral. Pt states that she was dxed with PNA on 03/29/14- still having dull CP, SOB, and non prod cough. She states has no energy.  Also having right side back pain.   fatigue is main problem, fever gone, sore throat better Cough with Sense of pnds but no excess or purulent sputum and not bothering her while sleeping Doe x > slow adl since admit Not clear either hfa or nebs helping any of her symptoms  >CXR showed cleared infiltrate  Labs with min elevated BNP ~230, ESR 28  Change losartan to bystolic   49/44/96 NP  Follow up  Returns for follow up for cough  Seen last ov 2 weeks ago for pulmonary consult for cough.  Cough not change with cough at night.  She had CT chest angio which was neg for PE, showed bronchitic changes.  Has on/off wheezing   BNP was slightly elevated at 230 , recent echo showed gr 1 DD with nml EF.    06/17/2014 f/u ov/Luvada Salamone re:  Chief Complaint  Patient presents  with  . Follow-up    pt still has sob, wheezing and cough; she had pft today  even on best days, coughs esp in am (not prematurely) yellow / green and nasal obst, fatigue  Last day felt good day Aug 2015  prettty good summer of 2015    took breo am of ov  rec If the event of any flare with breathing go ahead restart symbicort 80 Take 2 puffs first thing in am and then another 2 puffs about 12 hours later.  Change zantac to 150 mg taken after bfast and before bedtime perfectly regularly GERD diet      12/19/2014 f/u ov/Avian Greenawalt re: chronic cough / asthmatic bronchitis refractory to [place] Complaint  Patient presents with  . Acute Visit    Pt states dxed with PNA x 7 days ago. She c/o wheezing, weakness, and cough with minimal white sputum.    astelin / nasonex only 50% lately more discolored > Levaquin > 3 days left  Cough daily x 6 months on Breo with doe x more than slow adls  Back on shots per Dr Donneta Romberg x 2 m rec dymista one twice daily each daily  Stop breo, nasonex and astelin and I will let Dr Donneta Romberg know we will take "ownership" of all your resp meds except the shots until we can sort out the source  of all your problems  For cough Take delsym two tsp every 12 hours and supplement if needed with  tramadol 50 mg up to 2 every 4 hours  Once you have eliminated the cough for 3 straight days try reducing the tramadol first,  then the delsym as tolerated.   Plan A = automatic = dulera 100 Take 2 puffs first thing in am and then another 2 puffs about 12 hours later.  Prednisone 10 mg take  4 each am x 2 days,   2 each am x 2 days,  1 each am x 2 days and stop  Plan B = backup  For breathing  Only use your xo as a rescue medication  Plan C = backup to plan B = albuterol neb up to  every 4 hours if needed Omeprazole 40 mg Take 30-60 min before first meal of the day and zantac 300 mg at bedtime GERD diet Please see patient coordinator before you leave today  to schedule sinus CT    Please schedule a follow up office visit in 2 weeks, sooner if needed and bring all medications / nebs/ inhalers    01/02/2015 f/u ov/Marlen Mollica re: cough x 6 m worse since late June 2016/ no meds/ inhalers  Chief Complaint  Patient presents with  . Follow-up    Pt states her cough is no better- still prod but with occ green sputum now. She states still feeling weak and has also been wheezing. Symptoms are esp worse at night.   Symptoms worse p supper and at bedtime / says tramadol did not stop the coughing/ pred may have helped some    No obvious patterns in  day to day or daytime variabilty or assoc  cp or chest tightness,  overt   hb symptoms. No unusual exp hx or h/o childhood pna/ asthma or knowledge of premature birth.  Sleeping poorly due to cough " only tussionex helps"  .- denies any obvious fluctuation of symptoms with weather or environmental changes or other aggravating or alleviating factors except as outlined above   Current Medications, Allergies, Complete Past Medical History, Past Surgical History, Family History, and Social History were reviewed in [employer] record.  ROS  The following are not active complaints unless bolded sore throat, dysphagia, dental problems, itching, sneezing,  nasal congestion or excess/ purulent secretions, ear ache,   fever, chills, sweats, unintended wt loss, pleuritic or exertional cp, hemoptysis,  orthopnea pnd or leg swelling, presyncope, palpitations, heartburn, abdominal pain, anorexia, nausea, vomiting, diarrhea  or change in bowel or urinary habits, change in stools or urine, dysuria,hematuria,  rash, arthralgias, visual complaints, headache, numbness weakness or ataxia or problems with walking or coordination,  change in mood/affect or memory.             Objective:   Physical Exam  amb wf harsh hacking coughing no flutter on hand   12/19/2014          115  > 01/02/2015   116 Wt Readings from Last 3 Encounters:   06/17/14 115 lb (52.164 kg)  05/13/14 117 lb (53.071 kg)  05/03/14 115 lb 12.8 oz (52.527 kg)    Vital signs reviewed       HEENT: nl dentition, turbinates, and orophanx. Nl external ear canals without cough reflex   NECK :  without JVD/Nodes/TM/ nl carotid upstrokes bilaterally   LUNGS: no acc muscle use,   Mostly pseudowheeze better with plm   CV:  RRR  no s3 or murmur or increase in P2, no edema   ABD:  soft and nontender with nl excursion in the supine position. No bruits or organomegaly, bowel sounds nl  MS:  warm without deformities, calf tenderness, cyanosis or clubbing  SKIN: warm and dry without lesions    NEURO:  alert, approp, no deficits     cxr reviewed from 12/17/14 mild hyperinflation with breast implants/ no as dz    I personally reviewed images and agree with radiology impression as follows:  CT sinuses 12/25/14 1. Small amount of fluid layers in the maxillary sinuses and the left frontal sinus indicating mild sinusitis. 2. Compromise of the nasal airway by prominent terminates, and possibly nasal mucosal edema.      Assessment & Plan:

## 2015-01-03 ENCOUNTER — Telehealth: Payer: Self-pay | Admitting: Internal Medicine

## 2015-01-03 MED ORDER — MOMETASONE FURO-FORMOTEROL FUM 100-5 MCG/ACT IN AERO: 2.0000 | INHALATION_SPRAY | Freq: Two times a day (BID) | RESPIRATORY_TRACT | Status: DC

## 2015-01-03 NOTE — Telephone Encounter (Signed)
Patient called to get refill on [facility].  Patient also had questions about how to use the Flutter Device.  Per Mindy, patient is using this device different than the normal instructions for the device.  Dr. Melvyn Novas is having patient cough into the device instead of blowing through it to relieve tension from her vocal cords.  Patient advised how to use device per Dr. Gustavus Bryant instructions, not the device instructions.  Refill of Dulera sent to pharmacy. Nothing further needed.

## 2015-01-05 ENCOUNTER — Encounter: Payer: Self-pay | Admitting: Internal Medicine

## 2015-01-05 NOTE — Assessment & Plan Note (Signed)
Spirometry June 2009 wnl  MRI brain 06/2012 neg sinus dz  - Sinus CT 12/25/2014 1. Small amount of fluid layers in the maxillary sinuses and the left frontal sinus indicating mild sinusitis. 2. Compromise of the nasal airway by prominent terminates, and possibly nasal mucosal edema. rec augmentin x 10 days and ent f/u rec 01/02/15  - added futter valve 01/02/15 with short course demerol to control

## 2015-01-05 NOTE — Assessment & Plan Note (Signed)
See Sinus CT 7/131/6  - refer to ENT 01/02/2015

## 2015-01-05 NOTE — Assessment & Plan Note (Signed)
pfts wnl 06/17/14 including fef 2575 p BREO in am  - 12/19/2014 p extensive coaching HFA effectiveness =    75% > try dulera 100 2bid  - sinus CT 12/19/2014 1. Small amount of fluid layers in the maxillary sinuses and the left frontal sinus indicating mild sinusitis. 2. Compromise of the nasal airway by prominent terminates, and possibly nasal mucosal edema.> rx augmentin > referred to Ent  Cough clearly aggravated by sinusitis/ pnds but probably multifactorial and clearly cyclical at this point so will need short course of demerol to control it  The proper method of use, as well as anticipated side effects, of a metered-dose inhaler are discussed and demonstrated to the patient. Improved effectiveness after extensive coaching during this visit to a level of approximately  75% so continue dulera in the lower dose so as not to aggravate the cough.  I had an extended discussion with the patient reviewing all relevant studies completed to date and  lasting 15 to 20 minutes of a 25 minute visit  The standardized cough guidelines published in Chest by Lissa Morales in 2006 are still the best available and consist of a multiple step process (up to 12!) , not a single office visit,  and are intended  to address this problem logically,  with an alogrithm dependent on response to empiric treatment at  each progressive step  to determine a specific diagnosis with  minimal addtional testing needed. Therefore if adherence is an issue or can't be accurately verified,  it's very unlikely the standard evaluation and treatment will be successful here.    Furthermore, response to therapy (other than acute cough suppression, which should only be used short term with avoidance of narcotic containing cough syrups if possible), can be a gradual process for which the patient may perceive immediate benefit.  Unlike going to an eye doctor where the best perscription is almost always the first one and is immediately effective,  this is almost never the case in the management of chronic cough syndromes. Therefore the patient needs to commit up front to consistently adhere to recommendations  for up to 6 weeks of therapy directed at the likely underlying problem(s) before the response can be reasonably evaluated.  At each step we need to do a complete trust but verify which I was not able to do as she did not follow instructions by bringing all meds/ inhalers with her    Each maintenance medication was reviewed in detail including most importantly the difference between maintenance and prns and under what circumstances the prns are to be triggered using an action plan format that is not reflected in the computer generated alphabetically organized AVS.    Please see instructions for details which were reviewed in writing and the patient given a copy highlighting the part that I personally wrote and discussed at today's ov.

## 2015-01-07 DIAGNOSIS — J321 Chronic frontal sinusitis: Secondary | ICD-10-CM | POA: Diagnosis not present

## 2015-01-07 DIAGNOSIS — J45901 Unspecified asthma with (acute) exacerbation: Secondary | ICD-10-CM | POA: Diagnosis not present

## 2015-01-07 DIAGNOSIS — J320 Chronic maxillary sinusitis: Secondary | ICD-10-CM | POA: Diagnosis not present

## 2015-01-07 DIAGNOSIS — K219 Gastro-esophageal reflux disease without esophagitis: Secondary | ICD-10-CM | POA: Diagnosis not present

## 2015-01-07 DIAGNOSIS — J322 Chronic ethmoidal sinusitis: Secondary | ICD-10-CM | POA: Diagnosis not present

## 2015-01-07 DIAGNOSIS — G4730 Sleep apnea, unspecified: Secondary | ICD-10-CM | POA: Diagnosis not present

## 2015-01-13 DIAGNOSIS — J3089 Other allergic rhinitis: Secondary | ICD-10-CM | POA: Diagnosis not present

## 2015-01-13 DIAGNOSIS — J301 Allergic rhinitis due to pollen: Secondary | ICD-10-CM | POA: Diagnosis not present

## 2015-01-16 DIAGNOSIS — J301 Allergic rhinitis due to pollen: Secondary | ICD-10-CM | POA: Diagnosis not present

## 2015-01-16 DIAGNOSIS — J3089 Other allergic rhinitis: Secondary | ICD-10-CM | POA: Diagnosis not present

## 2015-01-17 ENCOUNTER — Other Ambulatory Visit (INDEPENDENT_AMBULATORY_CARE_PROVIDER_SITE_OTHER): Payer: Medicare Other

## 2015-01-17 DIAGNOSIS — E038 Other specified hypothyroidism: Secondary | ICD-10-CM | POA: Diagnosis not present

## 2015-01-17 DIAGNOSIS — E063 Autoimmune thyroiditis: Secondary | ICD-10-CM

## 2015-01-17 LAB — TSH: TSH: 0.38 u[IU]/mL (ref 0.35–4.50)

## 2015-01-22 ENCOUNTER — Ambulatory Visit (INDEPENDENT_AMBULATORY_CARE_PROVIDER_SITE_OTHER): Payer: Medicare Other | Admitting: Endocrinology

## 2015-01-22 ENCOUNTER — Encounter: Payer: Self-pay | Admitting: Endocrinology

## 2015-01-22 VITALS — BP 136/84 | HR 55 | Temp 97.8°F | Resp 14 | Ht 60.0 in | Wt 117.0 lb

## 2015-01-22 DIAGNOSIS — E038 Other specified hypothyroidism: Secondary | ICD-10-CM | POA: Diagnosis not present

## 2015-01-22 DIAGNOSIS — E063 Autoimmune thyroiditis: Secondary | ICD-10-CM

## 2015-01-22 NOTE — Progress Notes (Signed)
Patient ID: Elizabeth Walker, female   DOB: Nov 08, 1945, 69 y.o.   MRN: [id number]  Reason for Appointment:  Hypothyroidism, followup visit    History of Present Illness:    The hypothyroidism was first diagnosed  when she was about 69 years old and was having significant symptoms at onset  Previously she was taking Synthroid 112 mcg, 1 tablet daily She saw her PCP earlier this year and she was told that her thyroid level was slightly high When she was seen in 09/2014 she was complaining of feeling loss of energy for over 2 months and difficulty losing weight. She also has had chronic hair loss  Because of her continued symptoms of fatigue with normal to slightly low TSH level she was asked to try Armour thyroid instead of Synthroid She has been taking Armour Thyroid 90 mg, 6 tablets a week and is taking a half tablet on Fridays and Saturdays She does not remember if she felt better with switching from Synthroid to this preparation  She says since about June she has been having trouble with pneumonia and sinus infections and this has made her feel bad and tired Recently has been starting to feel a little better. She thinks she still has difficulty losing weight and also had lost No cold intolerance TSH is now in the normal range  Compliance with the medical regimen has been as prescribed with taking the tablet in the morning before breakfast.  TSH levels currently available as follows:  Lab Results  Component Value Date   TSH 0.38 01/17/2015   TSH 0.17* 10/07/2014   TSH 1.900 04/12/2013   FREET4 1.37 10/07/2014   FREET4 1.47 04/12/2013        Medication List       This list is accurate as of: 01/22/15  4:34 PM.  Always use your most recent med list.               acetaminophen 500 MG tablet  Commonly known as:  TYLENOL  Take 500 mg by mouth every 6 (six) hours as needed.     amoxicillin-clavulanate 875-125 MG per tablet  Commonly known as:  AUGMENTIN  Take  1 tablet by mouth 2 (two) times daily.     ARMOUR THYROID 90 MG tablet  Generic drug:  thyroid  TAKE 1 WHOLE TABLET 5 DAYS A WEEK AND TAKE 1/2 TABLET THE OTHER TWO DAYS     atorvastatin 40 MG tablet  Commonly known as:  LIPITOR  TAKE ONE TABLET BY MOUTH ONE TIME DAILY     Azelastine-Fluticasone 137-50 MCG/ACT Susp  Place 1 spray into the nose 2 (two) times daily.     BYSTOLIC 5 MG tablet  Generic drug:  nebivolol  TAKE 1 TABLET EVERY DAY     ergocalciferol 50000 UNITS capsule  Commonly known as:  VITAMIN D2  Take 50,000 Units by mouth every Saturday.     FLUTTER Devi  Use as directed     hydrochlorothiazide 12.5 MG capsule  Commonly known as:  MICROZIDE  Take 12.5 mg by mouth daily.     LORazepam 0.5 MG tablet  Commonly known as:  ATIVAN  Take 0.5 mg by mouth 2 (two) times daily as needed.     meperidine 50 MG tablet  Commonly known as:  DEMEROL  Take 1 tablet (50 mg total) by mouth every 4 (four) hours as needed (cough).     mometasone-formoterol 100-5 MCG/ACT Aero  Commonly known as:  DULERA  Inhale 2 puffs into the lungs 2 (two) times daily.     nitroGLYCERIN 0.4 MG SL tablet  Commonly known as:  NITROSTAT  Place 1 tablet (0.4 mg total) under the tongue every 5 (five) minutes as needed for chest pain.     PROBIOTIC DAILY Caps  Take 1 capsule by mouth daily.     ranitidine 150 MG tablet  Commonly known as:  ZANTAC  Take 150 mg by mouth 2 (two) times daily.        Allergies:  Allergies  Allergen Reactions  . Codeine Nausea And Vomiting    Past Medical History  Diagnosis Date  . GERD (gastroesophageal reflux disease)   . Hypercholesteremia   . Hypothyroid   . Hypertension   . Depression   . Osteoporosis   . Anxiety   . Arthritis   . Sinusitis   . Tachycardia     a. suspected due to sinus tach. Event monitor/echo unremarkable.  . Sleep apnea     a. intolerant to CPAP.  . Migraine   . Pneumonia   . Esophageal spasm     a. pt reports prior GI  study demonstrating this.    Past Surgical History  Procedure Laterality Date  . Cesarean section  1967  . Combined augmentation mammaplasty and abdominoplasty      Family History  Problem Relation Age of Onset  . Coronary artery disease    . CAD Father     died at 36 - massive heart attack  . Heart disease Sister     had open heart surgery age 30  . Diabetes Sister   . Emphysema Father     smoked  . Asthma Brother   . Asthma Father   . CAD Brother     had open heart surgery at 48  . Kidney failure Brother   . Stroke Sister     Social History:  reports that she has never smoked. She has never used smokeless tobacco. She reports that she drinks alcohol. She reports that she does not use illicit drugs.  REVIEW Of SYSTEMS:  She has had some weight loss since earlier this year  Wt Readings from Last 3 Encounters:  01/22/15 117 lb (53.071 kg)  01/02/15 116 lb (52.617 kg)  12/19/14 115 lb 6.4 oz (52.345 kg)     Taking Bystolic for hypertension   Examination:   BP 136/84 mmHg  Pulse 55  Temp(Src) 97.8 F (36.6 C)  Resp 14  Ht 5' (1.524 m)  Wt 117 lb (53.071 kg)  BMI 22.85 kg/m2  SpO2 95%   GENERAL APPEARANCE:  she looks well.    No puffiness of face or peripheral edema        NECK: no thyromegaly.          NEUROLOGIC EXAM:  reflexes appear to be normal at biceps with normal relaxation    Assessments   Hypothyroidism, long-standing with somewhat variable dosage requirement in the past She has been on a trial of Armour Thyroid to see if she would subjectively feel better compared to Synthroid but she is unclear about any benefit yet Also has had some intercurrent illness this summer  Even though she is being charged full price for her Armour Thyroid she wants to continue this until her next visit  Hair loss: Discussed that this is unrelated to her thyroid  Hypercholesterolemia and hypertension: Followed by PCP    Treatment:  Continue Armour Thyroid, she  will  take a half tablet on Tuesdays and Fridays instead of Fridays and Saturdays  [facility] 01/22/2015, 4:34 PM

## 2015-01-23 ENCOUNTER — Ambulatory Visit: Payer: Medicare Other | Admitting: Internal Medicine

## 2015-01-23 ENCOUNTER — Other Ambulatory Visit: Payer: Self-pay | Admitting: Endocrinology

## 2015-01-24 DIAGNOSIS — J3089 Other allergic rhinitis: Secondary | ICD-10-CM | POA: Diagnosis not present

## 2015-01-24 DIAGNOSIS — J301 Allergic rhinitis due to pollen: Secondary | ICD-10-CM | POA: Diagnosis not present

## 2015-01-31 DIAGNOSIS — Z885 Allergy status to narcotic agent status: Secondary | ICD-10-CM | POA: Diagnosis not present

## 2015-01-31 DIAGNOSIS — J324 Chronic pansinusitis: Secondary | ICD-10-CM | POA: Diagnosis not present

## 2015-01-31 DIAGNOSIS — G4733 Obstructive sleep apnea (adult) (pediatric): Secondary | ICD-10-CM | POA: Diagnosis not present

## 2015-01-31 DIAGNOSIS — R0982 Postnasal drip: Secondary | ICD-10-CM | POA: Diagnosis not present

## 2015-01-31 DIAGNOSIS — J189 Pneumonia, unspecified organism: Secondary | ICD-10-CM | POA: Diagnosis not present

## 2015-01-31 DIAGNOSIS — J3089 Other allergic rhinitis: Secondary | ICD-10-CM | POA: Diagnosis not present

## 2015-01-31 DIAGNOSIS — J989 Respiratory disorder, unspecified: Secondary | ICD-10-CM | POA: Diagnosis not present

## 2015-02-05 DIAGNOSIS — J3089 Other allergic rhinitis: Secondary | ICD-10-CM | POA: Diagnosis not present

## 2015-02-05 DIAGNOSIS — J301 Allergic rhinitis due to pollen: Secondary | ICD-10-CM | POA: Diagnosis not present

## 2015-02-10 DIAGNOSIS — J301 Allergic rhinitis due to pollen: Secondary | ICD-10-CM | POA: Diagnosis not present

## 2015-02-10 DIAGNOSIS — J3089 Other allergic rhinitis: Secondary | ICD-10-CM | POA: Diagnosis not present

## 2015-02-10 DIAGNOSIS — H1013 Acute atopic conjunctivitis, bilateral: Secondary | ICD-10-CM | POA: Diagnosis not present

## 2015-02-12 DIAGNOSIS — H109 Unspecified conjunctivitis: Secondary | ICD-10-CM | POA: Diagnosis not present

## 2015-02-18 DIAGNOSIS — J3089 Other allergic rhinitis: Secondary | ICD-10-CM | POA: Diagnosis not present

## 2015-02-18 DIAGNOSIS — J301 Allergic rhinitis due to pollen: Secondary | ICD-10-CM | POA: Diagnosis not present

## 2015-02-19 DIAGNOSIS — H10413 Chronic giant papillary conjunctivitis, bilateral: Secondary | ICD-10-CM | POA: Diagnosis not present

## 2015-02-24 DIAGNOSIS — M810 Age-related osteoporosis without current pathological fracture: Secondary | ICD-10-CM | POA: Diagnosis not present

## 2015-02-24 DIAGNOSIS — J301 Allergic rhinitis due to pollen: Secondary | ICD-10-CM | POA: Diagnosis not present

## 2015-02-24 DIAGNOSIS — J3089 Other allergic rhinitis: Secondary | ICD-10-CM | POA: Diagnosis not present

## 2015-02-24 DIAGNOSIS — I10 Essential (primary) hypertension: Secondary | ICD-10-CM | POA: Diagnosis not present

## 2015-02-28 DIAGNOSIS — Z7951 Long term (current) use of inhaled steroids: Secondary | ICD-10-CM | POA: Diagnosis not present

## 2015-02-28 DIAGNOSIS — J189 Pneumonia, unspecified organism: Secondary | ICD-10-CM | POA: Diagnosis not present

## 2015-02-28 DIAGNOSIS — J320 Chronic maxillary sinusitis: Secondary | ICD-10-CM | POA: Diagnosis not present

## 2015-02-28 DIAGNOSIS — J45909 Unspecified asthma, uncomplicated: Secondary | ICD-10-CM | POA: Diagnosis not present

## 2015-02-28 DIAGNOSIS — R0982 Postnasal drip: Secondary | ICD-10-CM | POA: Diagnosis not present

## 2015-02-28 DIAGNOSIS — Z79899 Other long term (current) drug therapy: Secondary | ICD-10-CM | POA: Diagnosis not present

## 2015-02-28 DIAGNOSIS — J322 Chronic ethmoidal sinusitis: Secondary | ICD-10-CM | POA: Diagnosis not present

## 2015-02-28 DIAGNOSIS — J324 Chronic pansinusitis: Secondary | ICD-10-CM | POA: Diagnosis not present

## 2015-02-28 DIAGNOSIS — G4733 Obstructive sleep apnea (adult) (pediatric): Secondary | ICD-10-CM | POA: Diagnosis not present

## 2015-02-28 DIAGNOSIS — Z8701 Personal history of pneumonia (recurrent): Secondary | ICD-10-CM | POA: Diagnosis not present

## 2015-03-05 DIAGNOSIS — B999 Unspecified infectious disease: Secondary | ICD-10-CM | POA: Diagnosis not present

## 2015-03-05 DIAGNOSIS — Z885 Allergy status to narcotic agent status: Secondary | ICD-10-CM | POA: Diagnosis not present

## 2015-03-05 DIAGNOSIS — Z0184 Encounter for antibody response examination: Secondary | ICD-10-CM | POA: Diagnosis not present

## 2015-03-05 DIAGNOSIS — J3089 Other allergic rhinitis: Secondary | ICD-10-CM | POA: Diagnosis not present

## 2015-03-05 DIAGNOSIS — J188 Other pneumonia, unspecified organism: Secondary | ICD-10-CM | POA: Diagnosis not present

## 2015-03-05 DIAGNOSIS — Z8701 Personal history of pneumonia (recurrent): Secondary | ICD-10-CM | POA: Diagnosis not present

## 2015-03-05 DIAGNOSIS — I10 Essential (primary) hypertension: Secondary | ICD-10-CM | POA: Diagnosis not present

## 2015-03-05 DIAGNOSIS — J301 Allergic rhinitis due to pollen: Secondary | ICD-10-CM | POA: Diagnosis not present

## 2015-03-12 DIAGNOSIS — J3089 Other allergic rhinitis: Secondary | ICD-10-CM | POA: Diagnosis not present

## 2015-03-12 DIAGNOSIS — G4733 Obstructive sleep apnea (adult) (pediatric): Secondary | ICD-10-CM | POA: Diagnosis not present

## 2015-03-12 DIAGNOSIS — J301 Allergic rhinitis due to pollen: Secondary | ICD-10-CM | POA: Diagnosis not present

## 2015-03-16 DIAGNOSIS — G4733 Obstructive sleep apnea (adult) (pediatric): Secondary | ICD-10-CM | POA: Diagnosis not present

## 2015-03-21 DIAGNOSIS — J301 Allergic rhinitis due to pollen: Secondary | ICD-10-CM | POA: Diagnosis not present

## 2015-03-21 DIAGNOSIS — J3089 Other allergic rhinitis: Secondary | ICD-10-CM | POA: Diagnosis not present

## 2015-03-28 DIAGNOSIS — J3089 Other allergic rhinitis: Secondary | ICD-10-CM | POA: Diagnosis not present

## 2015-03-28 DIAGNOSIS — J301 Allergic rhinitis due to pollen: Secondary | ICD-10-CM | POA: Diagnosis not present

## 2015-04-02 DIAGNOSIS — I10 Essential (primary) hypertension: Secondary | ICD-10-CM | POA: Diagnosis not present

## 2015-04-02 DIAGNOSIS — F419 Anxiety disorder, unspecified: Secondary | ICD-10-CM | POA: Diagnosis not present

## 2015-04-02 DIAGNOSIS — G4733 Obstructive sleep apnea (adult) (pediatric): Secondary | ICD-10-CM | POA: Diagnosis not present

## 2015-04-02 DIAGNOSIS — G43909 Migraine, unspecified, not intractable, without status migrainosus: Secondary | ICD-10-CM | POA: Diagnosis not present

## 2015-04-02 DIAGNOSIS — E785 Hyperlipidemia, unspecified: Secondary | ICD-10-CM | POA: Diagnosis not present

## 2015-04-02 DIAGNOSIS — E039 Hypothyroidism, unspecified: Secondary | ICD-10-CM | POA: Diagnosis not present

## 2015-04-04 DIAGNOSIS — J301 Allergic rhinitis due to pollen: Secondary | ICD-10-CM | POA: Diagnosis not present

## 2015-04-04 DIAGNOSIS — J3089 Other allergic rhinitis: Secondary | ICD-10-CM | POA: Diagnosis not present

## 2015-04-10 ENCOUNTER — Emergency Department (HOSPITAL_COMMUNITY)
Admission: EM | Admit: 2015-04-10 | Discharge: 2015-04-10 | Disposition: A | Discharge status: Home / Self Care | Payer: Medicare Other | Attending: Emergency Medicine | Admitting: Emergency Medicine

## 2015-04-10 ENCOUNTER — Encounter (HOSPITAL_COMMUNITY): Payer: Self-pay | Admitting: Emergency Medicine

## 2015-04-10 DIAGNOSIS — Z7951 Long term (current) use of inhaled steroids: Secondary | ICD-10-CM | POA: Insufficient documentation

## 2015-04-10 DIAGNOSIS — F419 Anxiety disorder, unspecified: Secondary | ICD-10-CM | POA: Insufficient documentation

## 2015-04-10 DIAGNOSIS — Z8679 Personal history of other diseases of the circulatory system: Secondary | ICD-10-CM | POA: Diagnosis not present

## 2015-04-10 DIAGNOSIS — M1990 Unspecified osteoarthritis, unspecified site: Secondary | ICD-10-CM | POA: Diagnosis not present

## 2015-04-10 DIAGNOSIS — K219 Gastro-esophageal reflux disease without esophagitis: Secondary | ICD-10-CM | POA: Diagnosis not present

## 2015-04-10 DIAGNOSIS — R21 Rash and other nonspecific skin eruption: Secondary | ICD-10-CM | POA: Diagnosis not present

## 2015-04-10 DIAGNOSIS — Z79899 Other long term (current) drug therapy: Secondary | ICD-10-CM | POA: Diagnosis not present

## 2015-04-10 DIAGNOSIS — L299 Pruritus, unspecified: Secondary | ICD-10-CM | POA: Insufficient documentation

## 2015-04-10 DIAGNOSIS — T7840XA Allergy, unspecified, initial encounter: Secondary | ICD-10-CM | POA: Diagnosis not present

## 2015-04-10 DIAGNOSIS — Z8709 Personal history of other diseases of the respiratory system: Secondary | ICD-10-CM | POA: Diagnosis not present

## 2015-04-10 DIAGNOSIS — Z792 Long term (current) use of antibiotics: Secondary | ICD-10-CM | POA: Insufficient documentation

## 2015-04-10 DIAGNOSIS — E039 Hypothyroidism, unspecified: Secondary | ICD-10-CM | POA: Diagnosis not present

## 2015-04-10 DIAGNOSIS — L509 Urticaria, unspecified: Secondary | ICD-10-CM | POA: Diagnosis not present

## 2015-04-10 DIAGNOSIS — E7800 Pure hypercholesterolemia, unspecified: Secondary | ICD-10-CM | POA: Diagnosis not present

## 2015-04-10 DIAGNOSIS — J3089 Other allergic rhinitis: Secondary | ICD-10-CM | POA: Diagnosis not present

## 2015-04-10 DIAGNOSIS — Z8701 Personal history of pneumonia (recurrent): Secondary | ICD-10-CM | POA: Insufficient documentation

## 2015-04-10 DIAGNOSIS — J301 Allergic rhinitis due to pollen: Secondary | ICD-10-CM | POA: Diagnosis not present

## 2015-04-10 MED ORDER — FAMOTIDINE 20 MG PO TABS
20.0000 mg | ORAL_TABLET | Freq: Once | ORAL | Status: AC
  Administered 2015-04-10: 20 mg via ORAL
  Filled 2015-04-10: qty 1

## 2015-04-10 MED ORDER — TRIAMCINOLONE ACETONIDE 40 MG/ML IJ SUSP
40.0000 mg | Freq: Once | INTRAMUSCULAR | Status: AC
  Administered 2015-04-10: 40 mg via INTRAMUSCULAR
  Filled 2015-04-10: qty 1

## 2015-04-10 MED ORDER — FAMOTIDINE 20 MG PO TABS: 20.0000 mg | ORAL_TABLET | Freq: Two times a day (BID) | ORAL | Status: DC

## 2015-04-10 NOTE — ED Notes (Signed)
Pt reports hives for about 1 week, on chest , bilateral armpit, arms, and back. Pt was prescribed Fluocinonide , yet no relief. Pt denies known allergies and denies also use of new detergent or soap.

## 2015-04-10 NOTE — Discharge Instructions (Signed)
Tonight she was given a dose of Kenalog, which is a long-acting steroid as well as her first dose of Pepcid be been given a prescription for Pepcid.  Please take one tablet twice a day for the next 10 days.  This is a potent antihistamine and will help with itching, your physician gave you a steroid taper to start tomorrow.  Please follow these instructions carefully.  Follow-up with your physician as needed if the rash does not resolve or improve, then I think you need to see a dermatologist for further investigation

## 2015-04-10 NOTE — ED Provider Notes (Signed)
CSN: [id number]     Arrival date & time 04/10/15  2102 History  By signing my name below, I, Irene Pap, attest that this documentation has been prepared under the direction and in the presence of Junius Creamer, NP-C. Electronically Signed: Irene Pap, ED Scribe. 04/10/2015. 10:32 PM.  Chief Complaint  Patient presents with  . Urticaria   The history is provided by the patient. No language interpreter was used.  HPI Comments: Elizabeth Walker is a 69 y.o. Female with a hx of HTN and pneumonia who presents to the Emergency Department complaining of gradually worsening, recurrent, constant, itching hives to the chest, bilateral armpits, arms and back onset one week ago. She states that it feels like "something is just sticking in my legs." She states that the itching has been causing her to not sleep well. Pt states that she has been prescribed Fluocinonide to no relief and was told to start her prednisone tomorrow. She also states that she took a Benadryl to no relief. She states that she asked her doctor for a shot but was not given any by her PCP. She denies known allergies, use of new soaps, medications, or detergents. Pt states that she takes a throat spray, Fluticasone, Zantac, omeprazole, and mometasone.   Past Medical History  Diagnosis Date  . GERD (gastroesophageal reflux disease)   . Hypercholesteremia   . Hypothyroid   . Hypertension   . Depression   . Osteoporosis   . Anxiety   . Arthritis   . Sinusitis   . Tachycardia     a. suspected due to sinus tach. Event monitor/echo unremarkable.  . Sleep apnea     a. intolerant to CPAP.  . Migraine   . Pneumonia   . Esophageal spasm     a. pt reports prior GI study demonstrating this.   Past Surgical History  Procedure Laterality Date  . Cesarean section  1967  . Combined augmentation mammaplasty and abdominoplasty     Family History  Problem Relation Age of Onset  . Coronary artery disease    . CAD Father     died at 58  - massive heart attack  . Heart disease Sister     had open heart surgery age 39  . Diabetes Sister   . Emphysema Father     smoked  . Asthma Brother   . Asthma Father   . CAD Brother     had open heart surgery at 46  . Kidney failure Brother   . Stroke Sister    Social History  Substance Use Topics  . Smoking status: Never Smoker   . Smokeless tobacco: Never Used  . Alcohol Use: Yes     Comment: rare wine   OB History    No data available     Review of Systems  Respiratory: Negative for cough, shortness of breath and wheezing.   Skin: Positive for rash.  All other systems reviewed and are negative.     Allergies  Codeine  Home Medications   Prior to Admission medications   Medication Sig Start Date End Date Taking? Authorizing Provider  acetaminophen (TYLENOL) 500 MG tablet Take 500 mg by mouth every 6 (six) hours as needed.    Historical Provider, MD  amoxicillin-clavulanate (AUGMENTIN) 875-125 MG per tablet Take 1 tablet by mouth 2 (two) times daily. 12/25/14   Historical Provider, MD  ARMOUR THYROID 90 MG tablet TAKE 1 WHOLE TABLET 5 DAYS A WEEK AND TAKE 1/2 TABLET  THE OTHER TWO DAYS 01/23/15   Elayne Snare, MD  atorvastatin (LIPITOR) 40 MG tablet TAKE ONE TABLET BY MOUTH ONE TIME DAILY  04/09/14   Elayne Snare, MD  Azelastine-Fluticasone 137-50 MCG/ACT SUSP Place 1 spray into the nose 2 (two) times daily. 12/27/14   Tanda Rockers, MD  BYSTOLIC 5 MG tablet TAKE 1 TABLET EVERY DAY 12/20/14   Tanda Rockers, MD  ergocalciferol (VITAMIN D2) 50000 UNITS capsule Take 50,000 Units by mouth every Saturday.     Historical Provider, MD  famotidine (PEPCID) 20 MG tablet Take 1 tablet (20 mg total) by mouth 2 (two) times daily. 04/10/15   Junius Creamer, NP  hydrochlorothiazide (MICROZIDE) 12.5 MG capsule Take 12.5 mg by mouth daily.  03/16/13   Historical Provider, MD  LORazepam (ATIVAN) 0.5 MG tablet Take 0.5 mg by mouth 2 (two) times daily as needed.     Historical Provider, MD   meperidine (DEMEROL) 50 MG tablet Take 1 tablet (50 mg total) by mouth every 4 (four) hours as needed (cough). Patient not taking: Reported on 01/22/2015 01/02/15   Tanda Rockers, MD  mometasone-formoterol [facility]) 100-5 MCG/ACT AERO Inhale 2 puffs into the lungs 2 (two) times daily. 01/03/15   Tanda Rockers, MD  nitroGLYCERIN (NITROSTAT) 0.4 MG SL tablet Place 1 tablet (0.4 mg total) under the tongue every 5 (five) minutes as needed for chest pain. 04/08/14   Janece Canterbury, MD  Probiotic Product (PROBIOTIC DAILY) CAPS Take 1 capsule by mouth daily.    Historical Provider, MD  ranitidine (ZANTAC) 150 MG tablet Take 150 mg by mouth 2 (two) times daily.    Historical Provider, MD  Respiratory Therapy Supplies (FLUTTER) DEVI Use as directed 01/02/15   Tanda Rockers, MD   BP 180/60 mmHg  Pulse 57  Temp(Src) 98.1 F (36.7 C) (Oral)  Resp 18  Ht 5' (1.524 m)  Wt 118 lb (53.524 kg)  BMI 23.05 kg/m2  SpO2 100% Physical Exam  Constitutional: She is oriented to person, place, and time. She appears well-developed and well-nourished.  HENT:  Head: Normocephalic.  Neck: Normal range of motion.  Cardiovascular: Normal rate.   Pulmonary/Chest: Effort normal and breath sounds normal.  Musculoskeletal: Normal range of motion.  Neurological: She is alert and oriented to person, place, and time.  Skin: Rash noted. No abrasion, no bruising and no ecchymosis noted. There is erythema.     Recurrent rash in same areas  Nursing note and vitals reviewed.   ED Course  Procedures (including critical care time) DIAGNOSTIC STUDIES: Oxygen Saturation is 100% on RA, normal by my interpretation.    COORDINATION OF CARE: 9:35 PM-Discussed treatment plan which includes Pepcid and starting prednisone with pt at bedside and pt agreed to plan.   Labs Review Labs Reviewed - No data to display  Imaging Review No results found. I have personally reviewed and evaluated these images and lab results as part of  my medical decision-making.   EKG Interpretation None      MDM   Final diagnoses:  Rash    I personally performed the services described in this documentation, which was scribed in my presence. The recorded information has been reviewed and is accurate.   Junius Creamer, NP 04/10/15 [id number]  Milton Ferguson, MD 04/14/15 [phone]

## 2015-04-14 DIAGNOSIS — G4733 Obstructive sleep apnea (adult) (pediatric): Secondary | ICD-10-CM | POA: Diagnosis not present

## 2015-04-16 DIAGNOSIS — Z961 Presence of intraocular lens: Secondary | ICD-10-CM | POA: Diagnosis not present

## 2015-04-16 DIAGNOSIS — H11823 Conjunctivochalasis, bilateral: Secondary | ICD-10-CM | POA: Diagnosis not present

## 2015-04-16 DIAGNOSIS — H10413 Chronic giant papillary conjunctivitis, bilateral: Secondary | ICD-10-CM | POA: Diagnosis not present

## 2015-04-16 DIAGNOSIS — H16223 Keratoconjunctivitis sicca, not specified as Sjogren's, bilateral: Secondary | ICD-10-CM | POA: Diagnosis not present

## 2015-04-22 DIAGNOSIS — J301 Allergic rhinitis due to pollen: Secondary | ICD-10-CM | POA: Diagnosis not present

## 2015-04-22 DIAGNOSIS — J3089 Other allergic rhinitis: Secondary | ICD-10-CM | POA: Diagnosis not present

## 2015-04-24 DIAGNOSIS — J3089 Other allergic rhinitis: Secondary | ICD-10-CM | POA: Diagnosis not present

## 2015-04-24 DIAGNOSIS — J301 Allergic rhinitis due to pollen: Secondary | ICD-10-CM | POA: Diagnosis not present

## 2015-04-26 ENCOUNTER — Other Ambulatory Visit: Payer: Self-pay | Admitting: Internal Medicine

## 2015-04-28 DIAGNOSIS — J3089 Other allergic rhinitis: Secondary | ICD-10-CM | POA: Diagnosis not present

## 2015-04-28 DIAGNOSIS — J301 Allergic rhinitis due to pollen: Secondary | ICD-10-CM | POA: Diagnosis not present

## 2015-05-06 DIAGNOSIS — J3089 Other allergic rhinitis: Secondary | ICD-10-CM | POA: Diagnosis not present

## 2015-05-06 DIAGNOSIS — J301 Allergic rhinitis due to pollen: Secondary | ICD-10-CM | POA: Diagnosis not present

## 2015-05-13 DIAGNOSIS — J301 Allergic rhinitis due to pollen: Secondary | ICD-10-CM | POA: Diagnosis not present

## 2015-05-13 DIAGNOSIS — J3089 Other allergic rhinitis: Secondary | ICD-10-CM | POA: Diagnosis not present

## 2015-05-20 ENCOUNTER — Other Ambulatory Visit (INDEPENDENT_AMBULATORY_CARE_PROVIDER_SITE_OTHER): Payer: Medicare Other

## 2015-05-20 DIAGNOSIS — E038 Other specified hypothyroidism: Secondary | ICD-10-CM

## 2015-05-20 DIAGNOSIS — E063 Autoimmune thyroiditis: Secondary | ICD-10-CM

## 2015-05-20 DIAGNOSIS — J3089 Other allergic rhinitis: Secondary | ICD-10-CM | POA: Diagnosis not present

## 2015-05-20 DIAGNOSIS — J301 Allergic rhinitis due to pollen: Secondary | ICD-10-CM | POA: Diagnosis not present

## 2015-05-20 LAB — TSH: TSH: 0.41 u[IU]/mL (ref 0.35–4.50)

## 2015-05-23 ENCOUNTER — Encounter: Payer: Self-pay | Admitting: Endocrinology

## 2015-05-23 ENCOUNTER — Ambulatory Visit (INDEPENDENT_AMBULATORY_CARE_PROVIDER_SITE_OTHER): Payer: Medicare Other | Admitting: Endocrinology

## 2015-05-23 VITALS — BP 168/70 | HR 53 | Temp 98.2°F | Resp 14 | Ht 60.0 in | Wt 114.4 lb

## 2015-05-23 DIAGNOSIS — E063 Autoimmune thyroiditis: Secondary | ICD-10-CM

## 2015-05-23 DIAGNOSIS — J301 Allergic rhinitis due to pollen: Secondary | ICD-10-CM | POA: Diagnosis not present

## 2015-05-23 DIAGNOSIS — J3089 Other allergic rhinitis: Secondary | ICD-10-CM | POA: Diagnosis not present

## 2015-05-23 DIAGNOSIS — E038 Other specified hypothyroidism: Secondary | ICD-10-CM | POA: Diagnosis not present

## 2015-05-23 NOTE — Progress Notes (Signed)
Patient ID: Elizabeth Walker, female   DOB: Oct 27, 1945, 69 y.o.   MRN: [id number]   Reason for Appointment:  Hypothyroidism, followup visit    History of Present Illness:   The hypothyroidism was first diagnosed  when she was about 69 years old and was having significant symptoms at onset  Previously she was taking Synthroid 112 mcg, 1 tablet daily When she was seen in 09/2014 she was complaining of feeling loss of energy for over 2 months and difficulty losing weight. She also has had chronic hair loss  Because of her continued symptoms of fatigue with normal to slightly low TSH level she was asked to try Armour thyroid instead of Synthroid She has been taking Armour Thyroid 90 mg, 6 tablets a week and is taking a half tablet on Fridays and Tuesdays She is not clear if she felt better with switching from Synthroid to this preparation since he was having fatigue from other reasons Her dose was continued unchanged in 8/16  She still has some fatigue and is still not sure if she is benefiting from Armour Thyroid compared to Synthroid. She is being more for the Armour Thyroid which is not covered No cold intolerance TSH is now consistently in the low normal range  Compliance with the medical regimen has been as prescribed with taking the tablet in the morning before breakfast.  Thyroid levels currently available as follows:  Lab Results  Component Value Date   TSH 0.41 05/20/2015   TSH 0.38 01/17/2015   TSH 0.17* 10/07/2014   FREET4 1.37 10/07/2014   FREET4 1.47 04/12/2013    Wt Readings from Last 3 Encounters:  05/23/15 114 lb 6.4 oz (51.891 kg)  04/10/15 118 lb (53.524 kg)  01/22/15 117 lb (53.071 kg)      Medication List       This list is accurate as of: 05/23/15  1:10 PM.  Always use your most recent med list.               acetaminophen 500 MG tablet  Commonly known as:  TYLENOL  Take 500 mg by mouth every 6 (six) hours as needed.     amoxicillin-clavulanate 875-125 MG tablet  Commonly known as:  AUGMENTIN  Take 1 tablet by mouth 2 (two) times daily.     ARMOUR THYROID 90 MG tablet  Generic drug:  thyroid  TAKE 1 WHOLE TABLET 5 DAYS A WEEK AND TAKE 1/2 TABLET THE OTHER TWO DAYS     atorvastatin 40 MG tablet  Commonly known as:  LIPITOR  TAKE ONE TABLET BY MOUTH ONE TIME DAILY     Azelastine-Fluticasone 137-50 MCG/ACT Susp  Place 1 spray into the nose 2 (two) times daily.     BYSTOLIC 5 MG tablet  Generic drug:  nebivolol  TAKE 1 TABLET EVERY DAY     ergocalciferol 50000 UNITS capsule  Commonly known as:  VITAMIN D2  Take 50,000 Units by mouth every Saturday.     famotidine 20 MG tablet  Commonly known as:  PEPCID  Take 1 tablet (20 mg total) by mouth 2 (two) times daily.     FLUTTER Devi  Use as directed     hydrochlorothiazide 12.5 MG capsule  Commonly known as:  MICROZIDE  Take 12.5 mg by mouth daily.     LORazepam 0.5 MG tablet  Commonly known as:  ATIVAN  Take 0.5 mg by mouth 2 (two) times daily as needed.     mometasone-formoterol  100-5 MCG/ACT Aero  Commonly known as:  DULERA  Inhale 2 puffs into the lungs 2 (two) times daily.     nitroGLYCERIN 0.4 MG SL tablet  Commonly known as:  NITROSTAT  Place 1 tablet (0.4 mg total) under the tongue every 5 (five) minutes as needed for chest pain.     PROBIOTIC DAILY Caps  Take 1 capsule by mouth daily.     ranitidine 150 MG tablet  Commonly known as:  ZANTAC  Take 150 mg by mouth 2 (two) times daily.        Allergies:  Allergies  Allergen Reactions  . Codeine Nausea And Vomiting  . Pollen Extract Other (See Comments)    Stuffy nose    Past Medical History  Diagnosis Date  . GERD (gastroesophageal reflux disease)   . Hypercholesteremia   . Hypothyroid   . Hypertension   . Depression   . Osteoporosis   . Anxiety   . Arthritis   . Sinusitis   . Tachycardia     a. suspected due to sinus tach. Event monitor/echo unremarkable.  .  Sleep apnea     a. intolerant to CPAP.  . Migraine   . Pneumonia   . Esophageal spasm     a. pt reports prior GI study demonstrating this.    Past Surgical History  Procedure Laterality Date  . Cesarean section  1967  . Combined augmentation mammaplasty and abdominoplasty      Family History  Problem Relation Age of Onset  . Coronary artery disease    . CAD Father     died at 54 - massive heart attack  . Heart disease Sister     had open heart surgery age 35  . Diabetes Sister   . Emphysema Father     smoked  . Asthma Brother   . Asthma Father   . CAD Brother     had open heart surgery at 4  . Kidney failure Brother   . Stroke Sister     Social History:  reports that she has never smoked. She has never used smokeless tobacco. She reports that she drinks alcohol. She reports that she does not use illicit drugs.  REVIEW Of SYSTEMS:  Has had fairly stable weight  Wt Readings from Last 3 Encounters:  05/23/15 114 lb 6.4 oz (51.891 kg)  04/10/15 118 lb (53.524 kg)  01/22/15 117 lb (53.071 kg)     Taking Bystolic for hypertension, followed by PCP   Examination:   BP 168/70 mmHg  Pulse 53  Temp(Src) 98.2 F (36.8 C)  Resp 14  Ht 5' (1.524 m)  Wt 114 lb 6.4 oz (51.891 kg)  BMI 22.34 kg/m2  SpO2 97%   GENERAL APPEARANCE:  she looks well.    No puffiness of face or peripheral edema   No obvious alopecia       NECK: no thyromegaly.          NEUROLOGIC EXAM:  reflexes normal at biceps with normal relaxation    Assessments   Hypothyroidism, long-standing with somewhat variable dosage requirement in the past for thyroid supplements She has been on Armour Thyroid because of her continued symptoms of fatigue with Synthroid Still unclear whether she feels any better with Armour Thyroid and continues to have some fatigue She continues to have hair loss without alopecia  Discussed that she can go back to Synthroid as she did not have any clear benefits from Armour  Thyroid and Synthroid will  be more affordable especially with generic  Hair loss: Discussed that this is unrelated to her thyroidAnd she will need to see a dermatologist  Hypercholesterolemia, and hypertension: Followed by PCP  Sinus bradycardia on hypertension treatment   Treatment:  Continue Armour Thyroid for 1 month but subsequently changed back to Synthroid 112 g daily She will follow-up in 4 months  Marco Adelson 05/23/2015, 1:10 PM

## 2015-05-23 NOTE — Patient Instructions (Signed)
1/2 tab also on Sundays

## 2015-05-26 DIAGNOSIS — J3089 Other allergic rhinitis: Secondary | ICD-10-CM | POA: Diagnosis not present

## 2015-05-26 DIAGNOSIS — J301 Allergic rhinitis due to pollen: Secondary | ICD-10-CM | POA: Diagnosis not present

## 2015-05-27 DIAGNOSIS — M25562 Pain in left knee: Secondary | ICD-10-CM | POA: Diagnosis not present

## 2015-05-27 DIAGNOSIS — M542 Cervicalgia: Secondary | ICD-10-CM | POA: Diagnosis not present

## 2015-05-27 DIAGNOSIS — M25511 Pain in right shoulder: Secondary | ICD-10-CM | POA: Diagnosis not present

## 2015-06-02 ENCOUNTER — Other Ambulatory Visit: Payer: Self-pay | Admitting: Internal Medicine

## 2015-06-02 DIAGNOSIS — J320 Chronic maxillary sinusitis: Secondary | ICD-10-CM | POA: Diagnosis not present

## 2015-06-02 DIAGNOSIS — L908 Other atrophic disorders of skin: Secondary | ICD-10-CM | POA: Diagnosis not present

## 2015-06-02 DIAGNOSIS — H02831 Dermatochalasis of right upper eyelid: Secondary | ICD-10-CM | POA: Diagnosis not present

## 2015-06-02 DIAGNOSIS — H02409 Unspecified ptosis of unspecified eyelid: Secondary | ICD-10-CM | POA: Diagnosis not present

## 2015-06-02 DIAGNOSIS — G4733 Obstructive sleep apnea (adult) (pediatric): Secondary | ICD-10-CM | POA: Diagnosis not present

## 2015-06-02 DIAGNOSIS — J189 Pneumonia, unspecified organism: Secondary | ICD-10-CM | POA: Diagnosis not present

## 2015-06-02 DIAGNOSIS — R0982 Postnasal drip: Secondary | ICD-10-CM | POA: Diagnosis not present

## 2015-06-02 DIAGNOSIS — J328 Other chronic sinusitis: Secondary | ICD-10-CM | POA: Diagnosis not present

## 2015-06-02 DIAGNOSIS — H02423 Myogenic ptosis of bilateral eyelids: Secondary | ICD-10-CM | POA: Diagnosis not present

## 2015-06-02 DIAGNOSIS — J3089 Other allergic rhinitis: Secondary | ICD-10-CM | POA: Diagnosis not present

## 2015-06-02 DIAGNOSIS — J989 Respiratory disorder, unspecified: Secondary | ICD-10-CM | POA: Diagnosis not present

## 2015-06-02 DIAGNOSIS — Z7952 Long term (current) use of systemic steroids: Secondary | ICD-10-CM | POA: Diagnosis not present

## 2015-06-02 DIAGNOSIS — E038 Other specified hypothyroidism: Secondary | ICD-10-CM | POA: Diagnosis not present

## 2015-06-02 DIAGNOSIS — Z8701 Personal history of pneumonia (recurrent): Secondary | ICD-10-CM | POA: Diagnosis not present

## 2015-06-02 DIAGNOSIS — Z885 Allergy status to narcotic agent status: Secondary | ICD-10-CM | POA: Diagnosis not present

## 2015-06-02 DIAGNOSIS — H04129 Dry eye syndrome of unspecified lacrimal gland: Secondary | ICD-10-CM | POA: Diagnosis not present

## 2015-06-02 DIAGNOSIS — I10 Essential (primary) hypertension: Secondary | ICD-10-CM | POA: Diagnosis not present

## 2015-06-02 DIAGNOSIS — E039 Hypothyroidism, unspecified: Secondary | ICD-10-CM | POA: Diagnosis not present

## 2015-06-02 DIAGNOSIS — J301 Allergic rhinitis due to pollen: Secondary | ICD-10-CM | POA: Diagnosis not present

## 2015-06-02 DIAGNOSIS — J322 Chronic ethmoidal sinusitis: Secondary | ICD-10-CM | POA: Diagnosis not present

## 2015-06-02 DIAGNOSIS — H02834 Dermatochalasis of left upper eyelid: Secondary | ICD-10-CM | POA: Diagnosis not present

## 2015-06-02 DIAGNOSIS — Z79899 Other long term (current) drug therapy: Secondary | ICD-10-CM | POA: Diagnosis not present

## 2015-06-02 DIAGNOSIS — E785 Hyperlipidemia, unspecified: Secondary | ICD-10-CM | POA: Diagnosis not present

## 2015-06-06 DIAGNOSIS — J3089 Other allergic rhinitis: Secondary | ICD-10-CM | POA: Diagnosis not present

## 2015-06-06 DIAGNOSIS — J301 Allergic rhinitis due to pollen: Secondary | ICD-10-CM | POA: Diagnosis not present

## 2015-06-07 DIAGNOSIS — G4733 Obstructive sleep apnea (adult) (pediatric): Secondary | ICD-10-CM | POA: Diagnosis not present

## 2015-06-11 DIAGNOSIS — J3089 Other allergic rhinitis: Secondary | ICD-10-CM | POA: Diagnosis not present

## 2015-06-11 DIAGNOSIS — E039 Hypothyroidism, unspecified: Secondary | ICD-10-CM | POA: Diagnosis not present

## 2015-06-11 DIAGNOSIS — J301 Allergic rhinitis due to pollen: Secondary | ICD-10-CM | POA: Diagnosis not present

## 2015-06-18 DIAGNOSIS — E785 Hyperlipidemia, unspecified: Secondary | ICD-10-CM | POA: Diagnosis not present

## 2015-06-18 DIAGNOSIS — N182 Chronic kidney disease, stage 2 (mild): Secondary | ICD-10-CM | POA: Diagnosis not present

## 2015-06-18 DIAGNOSIS — I129 Hypertensive chronic kidney disease with stage 1 through stage 4 chronic kidney disease, or unspecified chronic kidney disease: Secondary | ICD-10-CM | POA: Diagnosis not present

## 2015-06-18 DIAGNOSIS — J301 Allergic rhinitis due to pollen: Secondary | ICD-10-CM | POA: Diagnosis not present

## 2015-06-18 DIAGNOSIS — E039 Hypothyroidism, unspecified: Secondary | ICD-10-CM | POA: Diagnosis not present

## 2015-06-18 DIAGNOSIS — J3089 Other allergic rhinitis: Secondary | ICD-10-CM | POA: Diagnosis not present

## 2015-06-26 DIAGNOSIS — J3089 Other allergic rhinitis: Secondary | ICD-10-CM | POA: Diagnosis not present

## 2015-06-26 DIAGNOSIS — J301 Allergic rhinitis due to pollen: Secondary | ICD-10-CM | POA: Diagnosis not present

## 2015-06-30 DIAGNOSIS — R293 Abnormal posture: Secondary | ICD-10-CM | POA: Diagnosis not present

## 2015-06-30 DIAGNOSIS — M6281 Muscle weakness (generalized): Secondary | ICD-10-CM | POA: Diagnosis not present

## 2015-06-30 DIAGNOSIS — M542 Cervicalgia: Secondary | ICD-10-CM | POA: Diagnosis not present

## 2015-07-01 DIAGNOSIS — J3089 Other allergic rhinitis: Secondary | ICD-10-CM | POA: Diagnosis not present

## 2015-07-01 DIAGNOSIS — J301 Allergic rhinitis due to pollen: Secondary | ICD-10-CM | POA: Diagnosis not present

## 2015-07-03 DIAGNOSIS — M6281 Muscle weakness (generalized): Secondary | ICD-10-CM | POA: Diagnosis not present

## 2015-07-03 DIAGNOSIS — M542 Cervicalgia: Secondary | ICD-10-CM | POA: Diagnosis not present

## 2015-07-03 DIAGNOSIS — R293 Abnormal posture: Secondary | ICD-10-CM | POA: Diagnosis not present

## 2015-07-08 DIAGNOSIS — H02833 Dermatochalasis of right eye, unspecified eyelid: Secondary | ICD-10-CM | POA: Diagnosis not present

## 2015-07-08 DIAGNOSIS — H02836 Dermatochalasis of left eye, unspecified eyelid: Secondary | ICD-10-CM | POA: Diagnosis not present

## 2015-07-08 DIAGNOSIS — Z01818 Encounter for other preprocedural examination: Secondary | ICD-10-CM | POA: Diagnosis not present

## 2015-07-08 DIAGNOSIS — H02403 Unspecified ptosis of bilateral eyelids: Secondary | ICD-10-CM | POA: Diagnosis not present

## 2015-07-09 DIAGNOSIS — J301 Allergic rhinitis due to pollen: Secondary | ICD-10-CM | POA: Diagnosis not present

## 2015-07-09 DIAGNOSIS — R001 Bradycardia, unspecified: Secondary | ICD-10-CM | POA: Diagnosis not present

## 2015-07-09 DIAGNOSIS — J3089 Other allergic rhinitis: Secondary | ICD-10-CM | POA: Diagnosis not present

## 2015-07-10 DIAGNOSIS — R293 Abnormal posture: Secondary | ICD-10-CM | POA: Diagnosis not present

## 2015-07-10 DIAGNOSIS — M542 Cervicalgia: Secondary | ICD-10-CM | POA: Diagnosis not present

## 2015-07-10 DIAGNOSIS — Z961 Presence of intraocular lens: Secondary | ICD-10-CM | POA: Diagnosis not present

## 2015-07-10 DIAGNOSIS — M6281 Muscle weakness (generalized): Secondary | ICD-10-CM | POA: Diagnosis not present

## 2015-07-14 DIAGNOSIS — J301 Allergic rhinitis due to pollen: Secondary | ICD-10-CM | POA: Diagnosis not present

## 2015-07-14 DIAGNOSIS — R293 Abnormal posture: Secondary | ICD-10-CM | POA: Diagnosis not present

## 2015-07-14 DIAGNOSIS — M542 Cervicalgia: Secondary | ICD-10-CM | POA: Diagnosis not present

## 2015-07-14 DIAGNOSIS — J3089 Other allergic rhinitis: Secondary | ICD-10-CM | POA: Diagnosis not present

## 2015-07-14 DIAGNOSIS — M6281 Muscle weakness (generalized): Secondary | ICD-10-CM | POA: Diagnosis not present

## 2015-07-16 DIAGNOSIS — R293 Abnormal posture: Secondary | ICD-10-CM | POA: Diagnosis not present

## 2015-07-16 DIAGNOSIS — M542 Cervicalgia: Secondary | ICD-10-CM | POA: Diagnosis not present

## 2015-07-16 DIAGNOSIS — G4733 Obstructive sleep apnea (adult) (pediatric): Secondary | ICD-10-CM | POA: Diagnosis not present

## 2015-07-16 DIAGNOSIS — M6281 Muscle weakness (generalized): Secondary | ICD-10-CM | POA: Diagnosis not present

## 2015-07-18 DIAGNOSIS — F419 Anxiety disorder, unspecified: Secondary | ICD-10-CM | POA: Diagnosis not present

## 2015-07-18 DIAGNOSIS — Z85828 Personal history of other malignant neoplasm of skin: Secondary | ICD-10-CM | POA: Diagnosis not present

## 2015-07-18 DIAGNOSIS — K219 Gastro-esophageal reflux disease without esophagitis: Secondary | ICD-10-CM | POA: Diagnosis not present

## 2015-07-18 DIAGNOSIS — H02831 Dermatochalasis of right upper eyelid: Secondary | ICD-10-CM | POA: Diagnosis not present

## 2015-07-18 DIAGNOSIS — H0236 Blepharochalasis left eye, unspecified eyelid: Secondary | ICD-10-CM | POA: Diagnosis not present

## 2015-07-18 DIAGNOSIS — G4733 Obstructive sleep apnea (adult) (pediatric): Secondary | ICD-10-CM | POA: Diagnosis not present

## 2015-07-18 DIAGNOSIS — H0233 Blepharochalasis right eye, unspecified eyelid: Secondary | ICD-10-CM | POA: Diagnosis not present

## 2015-07-18 DIAGNOSIS — I10 Essential (primary) hypertension: Secondary | ICD-10-CM | POA: Diagnosis not present

## 2015-07-18 DIAGNOSIS — E785 Hyperlipidemia, unspecified: Secondary | ICD-10-CM | POA: Diagnosis not present

## 2015-07-18 DIAGNOSIS — E559 Vitamin D deficiency, unspecified: Secondary | ICD-10-CM | POA: Diagnosis not present

## 2015-07-18 DIAGNOSIS — H02403 Unspecified ptosis of bilateral eyelids: Secondary | ICD-10-CM | POA: Diagnosis not present

## 2015-07-18 DIAGNOSIS — H02834 Dermatochalasis of left upper eyelid: Secondary | ICD-10-CM | POA: Diagnosis not present

## 2015-07-18 DIAGNOSIS — Z885 Allergy status to narcotic agent status: Secondary | ICD-10-CM | POA: Diagnosis not present

## 2015-07-18 DIAGNOSIS — F329 Major depressive disorder, single episode, unspecified: Secondary | ICD-10-CM | POA: Diagnosis not present

## 2015-07-18 DIAGNOSIS — E039 Hypothyroidism, unspecified: Secondary | ICD-10-CM | POA: Diagnosis not present

## 2015-07-18 DIAGNOSIS — H02423 Myogenic ptosis of bilateral eyelids: Secondary | ICD-10-CM | POA: Diagnosis not present

## 2015-07-19 DIAGNOSIS — G4733 Obstructive sleep apnea (adult) (pediatric): Secondary | ICD-10-CM | POA: Diagnosis not present

## 2015-07-24 DIAGNOSIS — E039 Hypothyroidism, unspecified: Secondary | ICD-10-CM | POA: Diagnosis not present

## 2015-07-24 DIAGNOSIS — J3081 Allergic rhinitis due to animal (cat) (dog) hair and dander: Secondary | ICD-10-CM | POA: Diagnosis not present

## 2015-07-24 DIAGNOSIS — I10 Essential (primary) hypertension: Secondary | ICD-10-CM | POA: Diagnosis not present

## 2015-07-24 DIAGNOSIS — J301 Allergic rhinitis due to pollen: Secondary | ICD-10-CM | POA: Diagnosis not present

## 2015-07-24 DIAGNOSIS — Z4802 Encounter for removal of sutures: Secondary | ICD-10-CM | POA: Diagnosis not present

## 2015-07-24 DIAGNOSIS — Z9842 Cataract extraction status, left eye: Secondary | ICD-10-CM | POA: Diagnosis not present

## 2015-07-24 DIAGNOSIS — E785 Hyperlipidemia, unspecified: Secondary | ICD-10-CM | POA: Diagnosis not present

## 2015-07-24 DIAGNOSIS — J3089 Other allergic rhinitis: Secondary | ICD-10-CM | POA: Diagnosis not present

## 2015-07-24 DIAGNOSIS — Z885 Allergy status to narcotic agent status: Secondary | ICD-10-CM | POA: Diagnosis not present

## 2015-07-24 DIAGNOSIS — Z48810 Encounter for surgical aftercare following surgery on the sense organs: Secondary | ICD-10-CM | POA: Diagnosis not present

## 2015-07-24 DIAGNOSIS — Z9841 Cataract extraction status, right eye: Secondary | ICD-10-CM | POA: Diagnosis not present

## 2015-07-28 DIAGNOSIS — J3089 Other allergic rhinitis: Secondary | ICD-10-CM | POA: Diagnosis not present

## 2015-07-28 DIAGNOSIS — J301 Allergic rhinitis due to pollen: Secondary | ICD-10-CM | POA: Diagnosis not present

## 2015-07-31 DIAGNOSIS — M6281 Muscle weakness (generalized): Secondary | ICD-10-CM | POA: Diagnosis not present

## 2015-07-31 DIAGNOSIS — M542 Cervicalgia: Secondary | ICD-10-CM | POA: Diagnosis not present

## 2015-07-31 DIAGNOSIS — Z1231 Encounter for screening mammogram for malignant neoplasm of breast: Secondary | ICD-10-CM | POA: Diagnosis not present

## 2015-07-31 DIAGNOSIS — R293 Abnormal posture: Secondary | ICD-10-CM | POA: Diagnosis not present

## 2015-08-05 DIAGNOSIS — M6281 Muscle weakness (generalized): Secondary | ICD-10-CM | POA: Diagnosis not present

## 2015-08-05 DIAGNOSIS — M542 Cervicalgia: Secondary | ICD-10-CM | POA: Diagnosis not present

## 2015-08-05 DIAGNOSIS — R293 Abnormal posture: Secondary | ICD-10-CM | POA: Diagnosis not present

## 2015-08-06 DIAGNOSIS — L659 Nonscarring hair loss, unspecified: Secondary | ICD-10-CM | POA: Diagnosis not present

## 2015-08-06 DIAGNOSIS — J3089 Other allergic rhinitis: Secondary | ICD-10-CM | POA: Diagnosis not present

## 2015-08-06 DIAGNOSIS — J301 Allergic rhinitis due to pollen: Secondary | ICD-10-CM | POA: Diagnosis not present

## 2015-08-07 DIAGNOSIS — R293 Abnormal posture: Secondary | ICD-10-CM | POA: Diagnosis not present

## 2015-08-07 DIAGNOSIS — M6281 Muscle weakness (generalized): Secondary | ICD-10-CM | POA: Diagnosis not present

## 2015-08-07 DIAGNOSIS — M542 Cervicalgia: Secondary | ICD-10-CM | POA: Diagnosis not present

## 2015-08-13 DIAGNOSIS — R293 Abnormal posture: Secondary | ICD-10-CM | POA: Diagnosis not present

## 2015-08-13 DIAGNOSIS — M6281 Muscle weakness (generalized): Secondary | ICD-10-CM | POA: Diagnosis not present

## 2015-08-13 DIAGNOSIS — M542 Cervicalgia: Secondary | ICD-10-CM | POA: Diagnosis not present

## 2015-08-15 DIAGNOSIS — G4733 Obstructive sleep apnea (adult) (pediatric): Secondary | ICD-10-CM | POA: Diagnosis not present

## 2015-08-15 DIAGNOSIS — I10 Essential (primary) hypertension: Secondary | ICD-10-CM | POA: Diagnosis not present

## 2015-08-15 DIAGNOSIS — Z9989 Dependence on other enabling machines and devices: Secondary | ICD-10-CM | POA: Diagnosis not present

## 2015-08-16 DIAGNOSIS — G4733 Obstructive sleep apnea (adult) (pediatric): Secondary | ICD-10-CM | POA: Diagnosis not present

## 2015-08-21 DIAGNOSIS — J3089 Other allergic rhinitis: Secondary | ICD-10-CM | POA: Diagnosis not present

## 2015-08-21 DIAGNOSIS — J301 Allergic rhinitis due to pollen: Secondary | ICD-10-CM | POA: Diagnosis not present

## 2015-09-01 DIAGNOSIS — J3089 Other allergic rhinitis: Secondary | ICD-10-CM | POA: Diagnosis not present

## 2015-09-01 DIAGNOSIS — J301 Allergic rhinitis due to pollen: Secondary | ICD-10-CM | POA: Diagnosis not present

## 2015-09-09 DIAGNOSIS — E559 Vitamin D deficiency, unspecified: Secondary | ICD-10-CM | POA: Diagnosis not present

## 2015-09-09 DIAGNOSIS — M810 Age-related osteoporosis without current pathological fracture: Secondary | ICD-10-CM | POA: Diagnosis not present

## 2015-09-09 DIAGNOSIS — Z79899 Other long term (current) drug therapy: Secondary | ICD-10-CM | POA: Diagnosis not present

## 2015-09-09 DIAGNOSIS — J301 Allergic rhinitis due to pollen: Secondary | ICD-10-CM | POA: Diagnosis not present

## 2015-09-09 DIAGNOSIS — J3089 Other allergic rhinitis: Secondary | ICD-10-CM | POA: Diagnosis not present

## 2015-09-10 ENCOUNTER — Other Ambulatory Visit: Payer: Self-pay | Admitting: Endocrinology

## 2015-09-17 ENCOUNTER — Other Ambulatory Visit (INDEPENDENT_AMBULATORY_CARE_PROVIDER_SITE_OTHER): Payer: Medicare Other

## 2015-09-17 DIAGNOSIS — E038 Other specified hypothyroidism: Secondary | ICD-10-CM | POA: Diagnosis not present

## 2015-09-17 DIAGNOSIS — E063 Autoimmune thyroiditis: Secondary | ICD-10-CM

## 2015-09-17 LAB — T4, FREE: FREE T4: 0.73 ng/dL (ref 0.60–1.60)

## 2015-09-17 LAB — TSH: TSH: 3.2 u[IU]/mL (ref 0.35–4.50)

## 2015-09-17 LAB — T3, FREE: T3, Free: 2.8 pg/mL (ref 2.3–4.2)

## 2015-09-22 ENCOUNTER — Ambulatory Visit (INDEPENDENT_AMBULATORY_CARE_PROVIDER_SITE_OTHER): Payer: Medicare Other | Admitting: Endocrinology

## 2015-09-22 ENCOUNTER — Encounter: Payer: Self-pay | Admitting: Endocrinology

## 2015-09-22 VITALS — BP 128/74 | HR 57 | Temp 97.9°F | Resp 16 | Ht 61.0 in | Wt 116.4 lb

## 2015-09-22 DIAGNOSIS — E038 Other specified hypothyroidism: Secondary | ICD-10-CM | POA: Diagnosis not present

## 2015-09-22 DIAGNOSIS — E063 Autoimmune thyroiditis: Secondary | ICD-10-CM

## 2015-09-22 MED ORDER — LEVOTHYROXINE SODIUM 100 MCG PO TABS: 100.0000 ug | ORAL_TABLET | Freq: Every day | ORAL | Status: DC

## 2015-09-22 NOTE — Progress Notes (Signed)
Patient ID: Elizabeth Walker, female   DOB: 17-Sep-1945, 70 y.o.   MRN: [id number]   Reason for Appointment:  Hypothyroidism, followup visit    History of Present Illness:   The hypothyroidism was first diagnosed  when she was about 70 years old and was having significant symptoms at onset  Previously she was taking Synthroid 112 mcg, 1 tablet daily but had relatively low TSH with this When she was seen in 09/2014 she was complaining of feeling loss of energy for over 2 months and difficulty losing weight. Because of her continued symptoms of fatigue with normal to slightly low TSH level she was asked to try Armour thyroid instead of Synthroid  She did not benefit subjectively with taking Armour Thyroid On her last visit she was told to switch back to Synthroid but she has not done so; she also was complaining about the cost She has been taking Armour Thyroid 90 mg, 5.5 tablets a week and is taking a half tablet 3 days a week  She is not clear if she felt better with switching from Synthroid to this preparation since he was having fatigue from other reasons Her dose was continued unchanged in 8/16  Has chronic cold intolerance Is being treated by a dermatologist for chronic alopecia also  Compliance with the medical regimen has been as prescribed with taking the tablet in the morning before breakfast.  Thyroid levels currently available as follows:  Lab Results  Component Value Date   TSH 3.20 09/17/2015   TSH 0.41 05/20/2015   TSH 0.38 01/17/2015   FREET4 0.73 09/17/2015   FREET4 1.37 10/07/2014   FREET4 1.47 04/12/2013    Wt Readings from Last 3 Encounters:  09/22/15 116 lb 6.4 oz (52.799 kg)  05/23/15 114 lb 6.4 oz (51.891 kg)  04/10/15 118 lb (53.524 kg)      Medication List       This list is accurate as of: 09/22/15  1:34 PM.  Always use your most recent med list.               acetaminophen 500 MG tablet  Commonly known as:  TYLENOL  Take 500 mg by  mouth every 6 (six) hours as needed.     ARMOUR THYROID 90 MG tablet  Generic drug:  thyroid  TAKE 1 TABLET BY MOUTH 5 DAYS A WEEK AND TAKE 1/2 TABLET THE OTHER TWODAYS     atorvastatin 40 MG tablet  Commonly known as:  LIPITOR  TAKE ONE TABLET BY MOUTH ONE TIME DAILY     Azelastine-Fluticasone 137-50 MCG/ACT Susp  Place 1 spray into the nose 2 (two) times daily.     BYSTOLIC 5 MG tablet  Generic drug:  nebivolol  TAKE 1 TABLET EVERY DAY     ergocalciferol 50000 units capsule  Commonly known as:  VITAMIN D2  Take 50,000 Units by mouth every Saturday.     famotidine 20 MG tablet  Commonly known as:  PEPCID  Take 1 tablet (20 mg total) by mouth 2 (two) times daily.     FLUTTER Devi  Use as directed     hydrochlorothiazide 12.5 MG capsule  Commonly known as:  MICROZIDE  Take 12.5 mg by mouth daily.     levothyroxine 100 MCG tablet  Commonly known as:  SYNTHROID, LEVOTHROID  Take 1 tablet (100 mcg total) by mouth daily.     LORazepam 0.5 MG tablet  Commonly known as:  ATIVAN  Take 0.5  mg by mouth 2 (two) times daily as needed.     mometasone-formoterol 100-5 MCG/ACT Aero  Commonly known as:  DULERA  Inhale 2 puffs into the lungs 2 (two) times daily.     nitroGLYCERIN 0.4 MG SL tablet  Commonly known as:  NITROSTAT  Place 1 tablet (0.4 mg total) under the tongue every 5 (five) minutes as needed for chest pain.     PROBIOTIC DAILY Caps  Take 1 capsule by mouth daily.     ranitidine 150 MG tablet  Commonly known as:  ZANTAC  Take 150 mg by mouth 2 (two) times daily.        Allergies:  Allergies  Allergen Reactions  . Codeine Nausea And Vomiting  . Pollen Extract Other (See Comments)    Stuffy nose    Past Medical History  Diagnosis Date  . GERD (gastroesophageal reflux disease)   . Hypercholesteremia   . Hypothyroid   . Hypertension   . Depression   . Osteoporosis   . Anxiety   . Arthritis   . Sinusitis   . Tachycardia     a. suspected due to  sinus tach. Event monitor/echo unremarkable.  . Sleep apnea     a. intolerant to CPAP.  . Migraine   . Pneumonia   . Esophageal spasm     a. pt reports prior GI study demonstrating this.    Past Surgical History  Procedure Laterality Date  . Cesarean section  1967  . Combined augmentation mammaplasty and abdominoplasty      Family History  Problem Relation Age of Onset  . Coronary artery disease    . CAD Father     died at 58 - massive heart attack  . Heart disease Sister     had open heart surgery age 13  . Diabetes Sister   . Emphysema Father     smoked  . Asthma Brother   . Asthma Father   . CAD Brother     had open heart surgery at 49  . Kidney failure Brother   . Stroke Sister     Social History:  reports that she has never smoked. She has never used smokeless tobacco. She reports that she drinks alcohol. She reports that she does not use illicit drugs.  REVIEW Of SYSTEMS:  Has had fairly stable weight  Wt Readings from Last 3 Encounters:  09/22/15 116 lb 6.4 oz (52.799 kg)  05/23/15 114 lb 6.4 oz (51.891 kg)  04/10/15 118 lb (53.524 kg)     Taking Bystolic and HCTZ for hypertension, followed by PCP  She has a mild tremor of her right hand, not seen by a neurologist for this  Now taking Propecia for hair loss from dermatologist since 2/17   Examination:   BP 128/74 mmHg  Pulse 57  Temp(Src) 97.9 F (36.6 C) (Oral)  Resp 16  Ht 5\' 1"  (1.549 m)  Wt 116 lb 6.4 oz (52.799 kg)  BMI 22.01 kg/m2  SpO2 97%  She looks well      NECK: no thyromegaly.          NEUROLOGIC EXAM:  reflexes normal at biceps with normal relaxation    Assessment/Plan   Hypothyroidism, long-standing with somewhat variable dosage requirement in the past for thyroid supplements She has been on Armour Thyroid because of her continued symptoms of fatigue with Synthroid Because of lack of benefit from Armour Thyroid she is not ready to switch to Synthroid again Most likely since  she was taking 112 g of Synthroid and had a low TSH will need to reduce it to 100 g now  She will get her labs checked in 6 weeks to confirm Synthroid dose and follow-up.  In 6 months    Elizabeth Walker 09/22/2015, 1:34 PM

## 2015-10-03 ENCOUNTER — Other Ambulatory Visit: Payer: Self-pay | Admitting: Specialist

## 2015-10-03 DIAGNOSIS — M79602 Pain in left arm: Secondary | ICD-10-CM

## 2015-10-03 DIAGNOSIS — M542 Cervicalgia: Principal | ICD-10-CM

## 2015-10-03 DIAGNOSIS — G8929 Other chronic pain: Secondary | ICD-10-CM

## 2015-10-10 ENCOUNTER — Other Ambulatory Visit: Payer: Medicare Other

## 2015-10-11 ENCOUNTER — Ambulatory Visit
Admission: RE | Admit: 2015-10-11 | Discharge: 2015-10-11 | Disposition: A | Discharge status: Home / Self Care | Payer: Medicare Other | Source: Ambulatory Visit | Attending: Specialist | Admitting: Specialist

## 2015-10-11 DIAGNOSIS — M79602 Pain in left arm: Secondary | ICD-10-CM

## 2015-10-11 DIAGNOSIS — M542 Cervicalgia: Principal | ICD-10-CM

## 2015-10-11 DIAGNOSIS — G8929 Other chronic pain: Secondary | ICD-10-CM

## 2015-12-27 IMAGING — US US ABDOMEN COMPLETE
1 series · 14 of 25 positions shown · non-contrast
Comparison: None.

CLINICAL DATA: Right upper quadrant pain and fever.

EXAM:
ULTRASOUND ABDOMEN COMPLETE

[Series 1: us abdomen complete · 0.26mm/px · 14 of 51 slices shown]
[im 1/51]
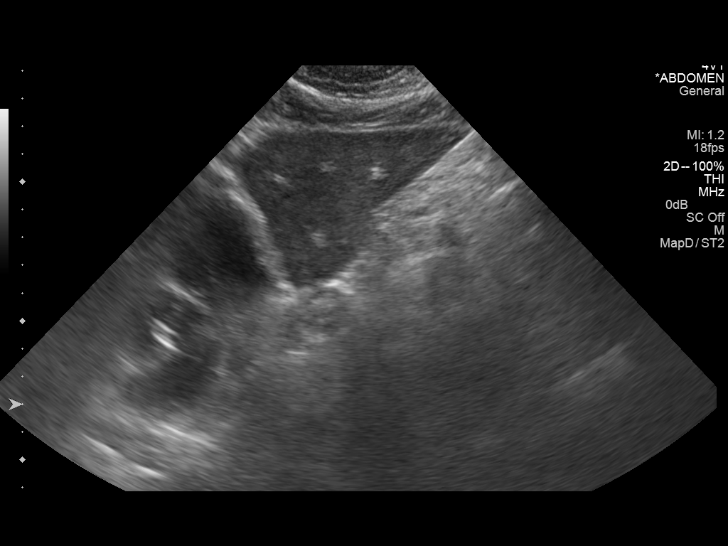
[im 5/51]
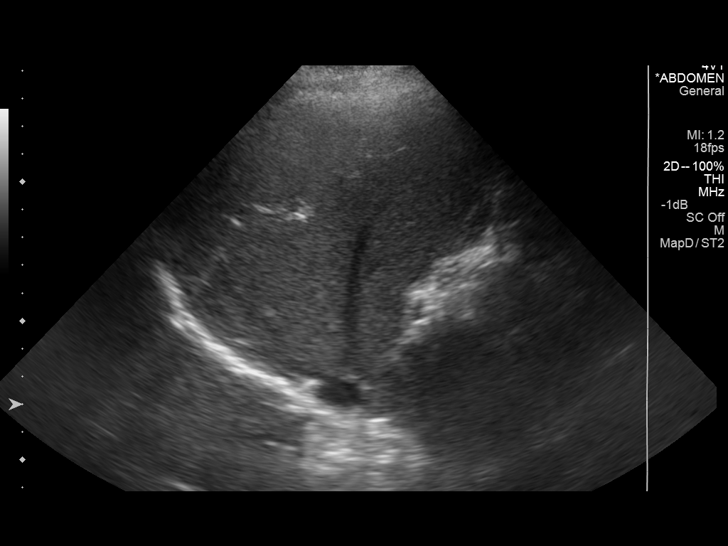
[im 9/51]
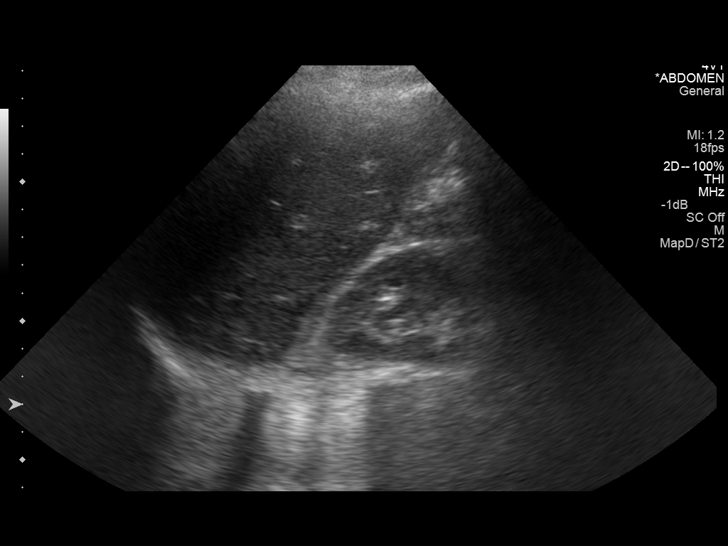
[im 13/51]
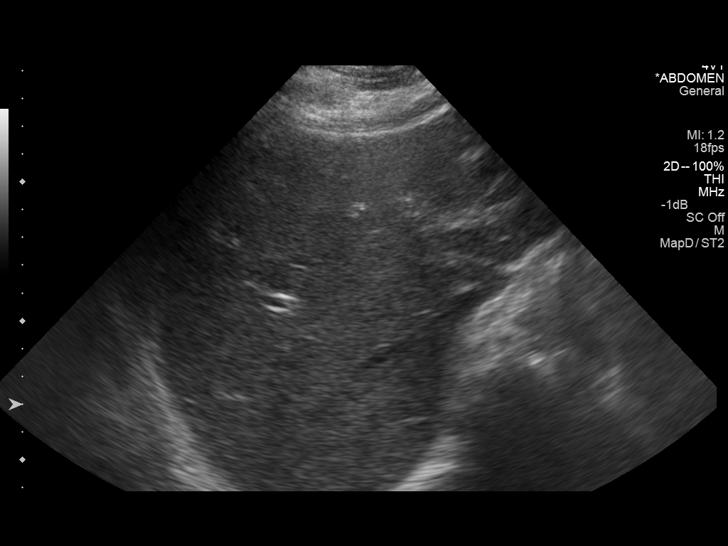
[im 17/51]
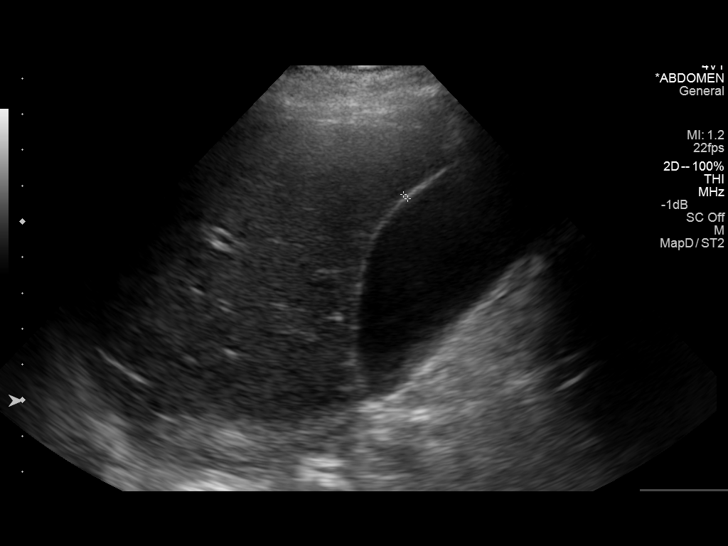
[im 19/51]
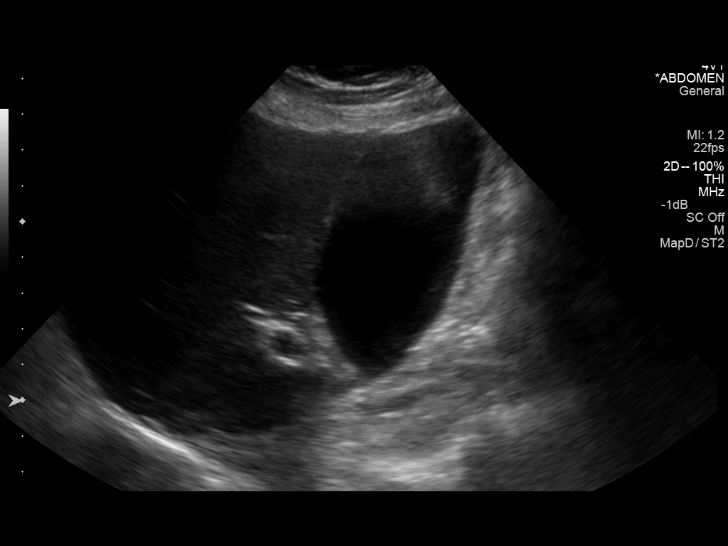
[im 23/51]
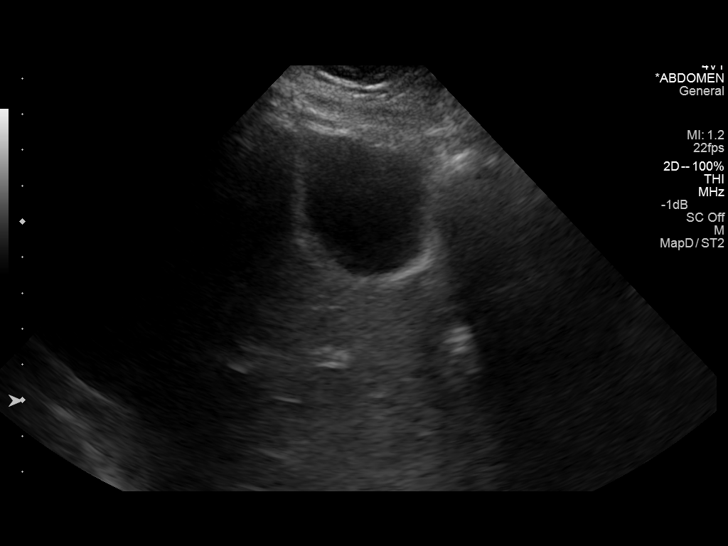
[im 28/51]
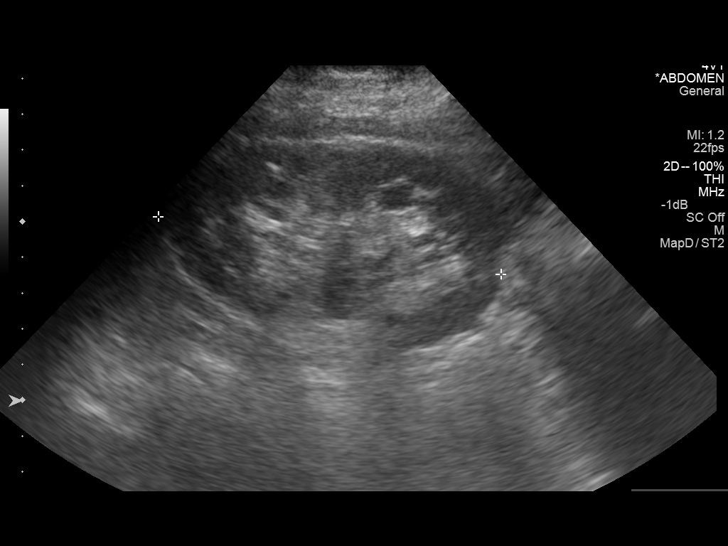
[im 32/51]
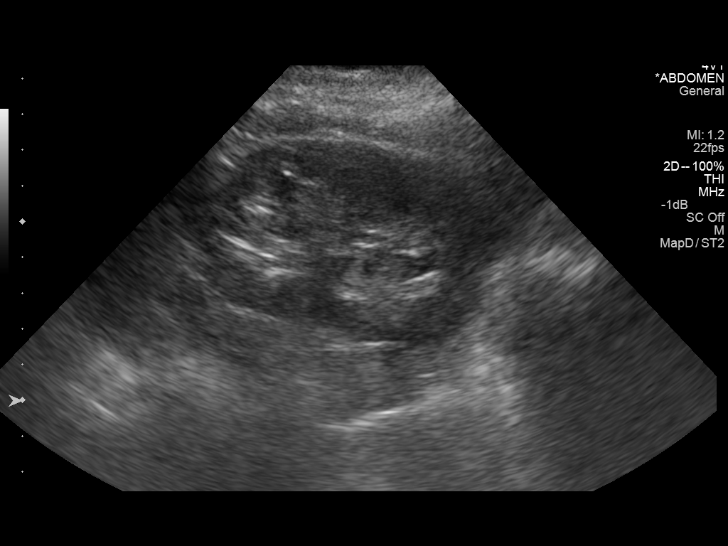
[im 34/51]
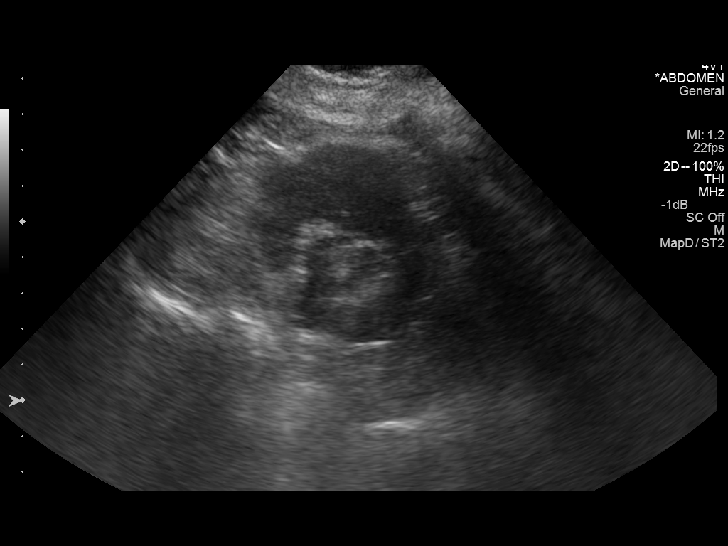
[im 38/51]
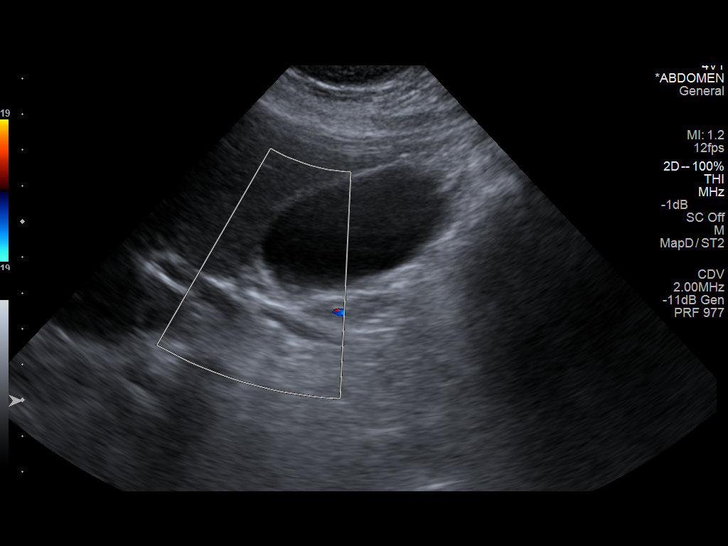
[im 42/51]
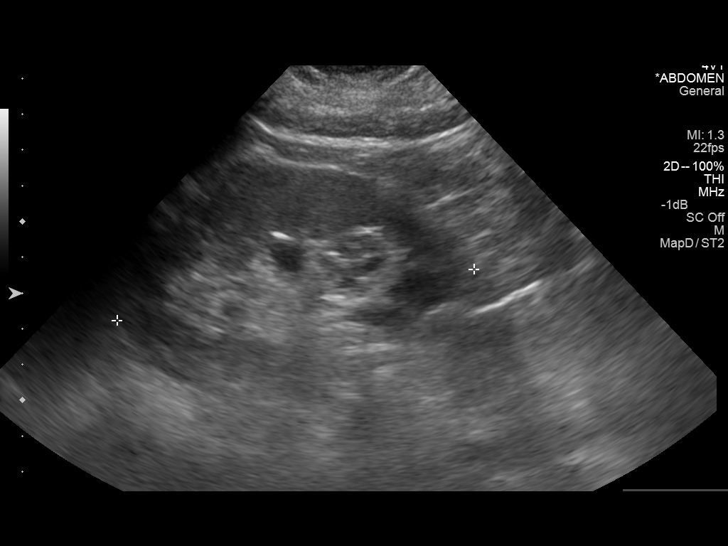
[im 46/51]
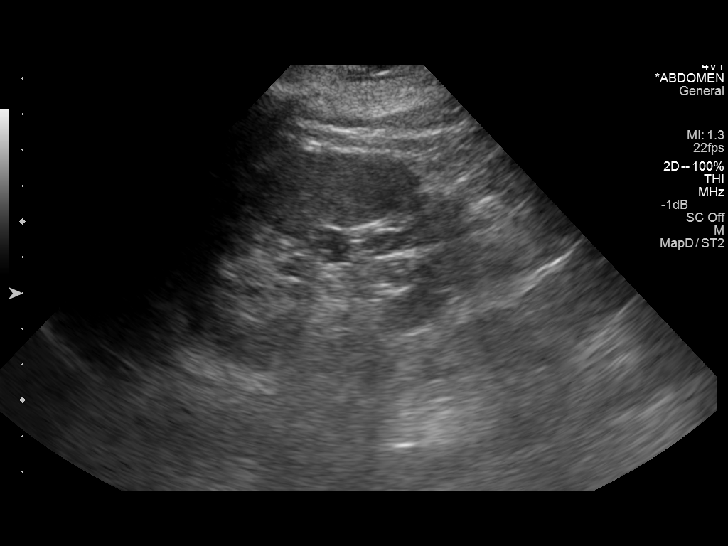
[im 51/51]
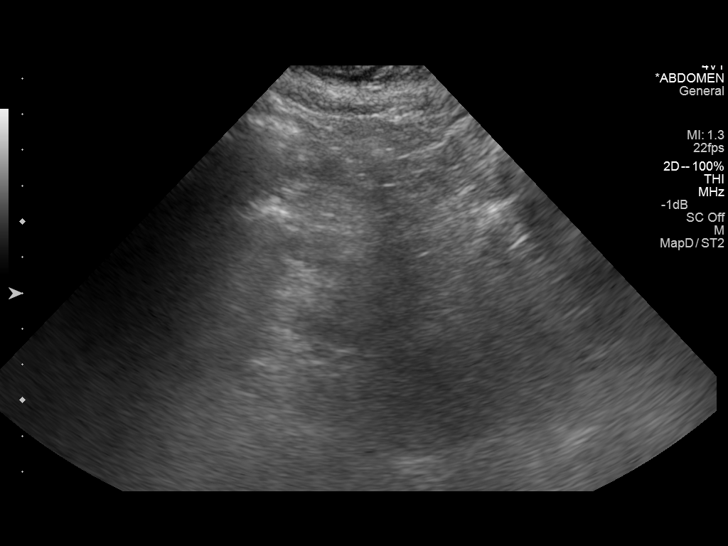

[14 of 25 positions shown; findings below may reference images not displayed]

FINDINGS: Gallbladder: No gallstones or wall thickening visualized. No
sonographic Murphy sign noted.

Common bile duct: Diameter: 0.4 cm

Liver: No focal lesion identified. Within normal limits in
parenchymal echogenicity.

IVC: No abnormality visualized.

Pancreas: Visualized portion unremarkable.

Spleen: Size and appearance within normal limits.

Right Kidney: Length: 9.7 cm. Echogenicity within normal limits. No
mass or hydronephrosis visualized.

Left Kidney: Length: 10.1 cm. Echogenicity within normal limits. No
mass. Mild caliectasis noted.

Abdominal aorta: No aneurysm visualized.

Other findings: None.
IMPRESSION: Mild caliectasis left kidney. Cause for this finding is not
identified.

Normal-appearing gallbladder.  Negative for gallstones.

## 2016-01-17 IMAGING — CR DG CHEST 2V
2 series · 2 of 2 positions shown · non-contrast
Comparison: 04/05/2014, CT 04/02/2014.

CLINICAL DATA: Pneumonia.  Cough.

EXAM:
CHEST  2 VIEW

[view not recorded (1 of 2)]
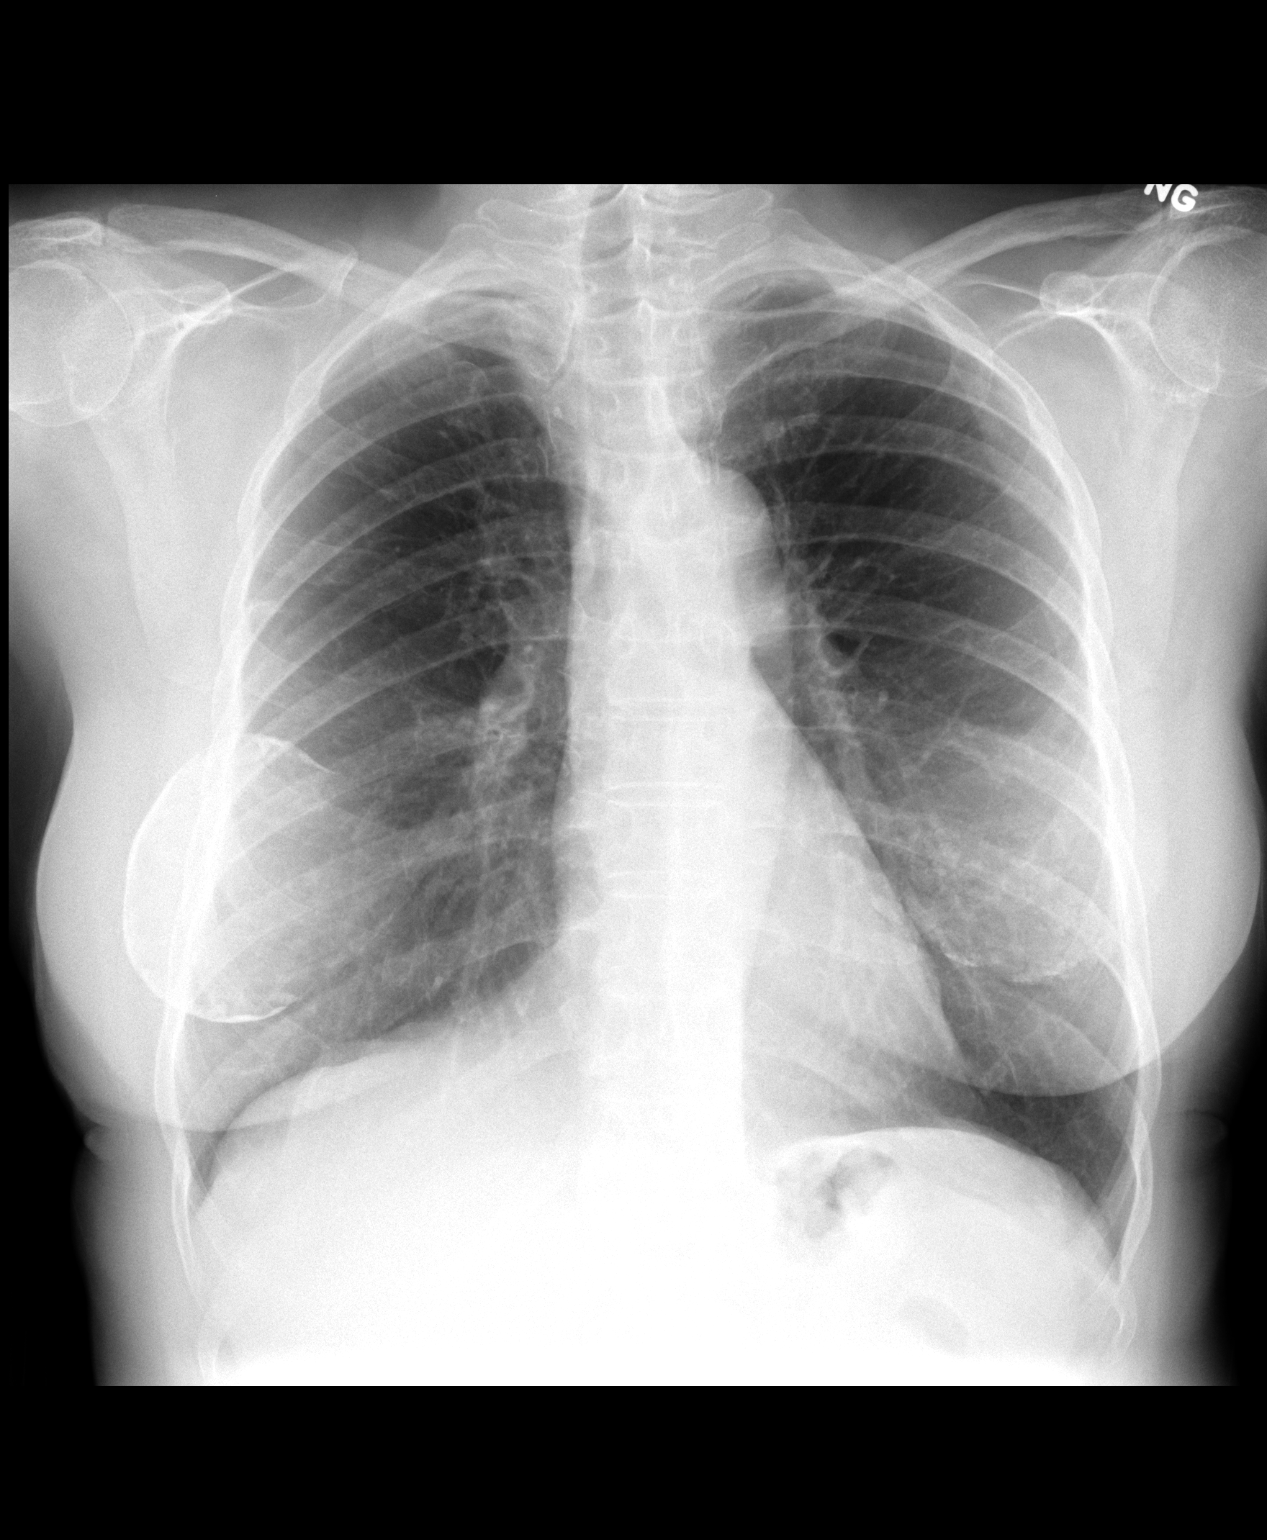

[view not recorded (2 of 2)]
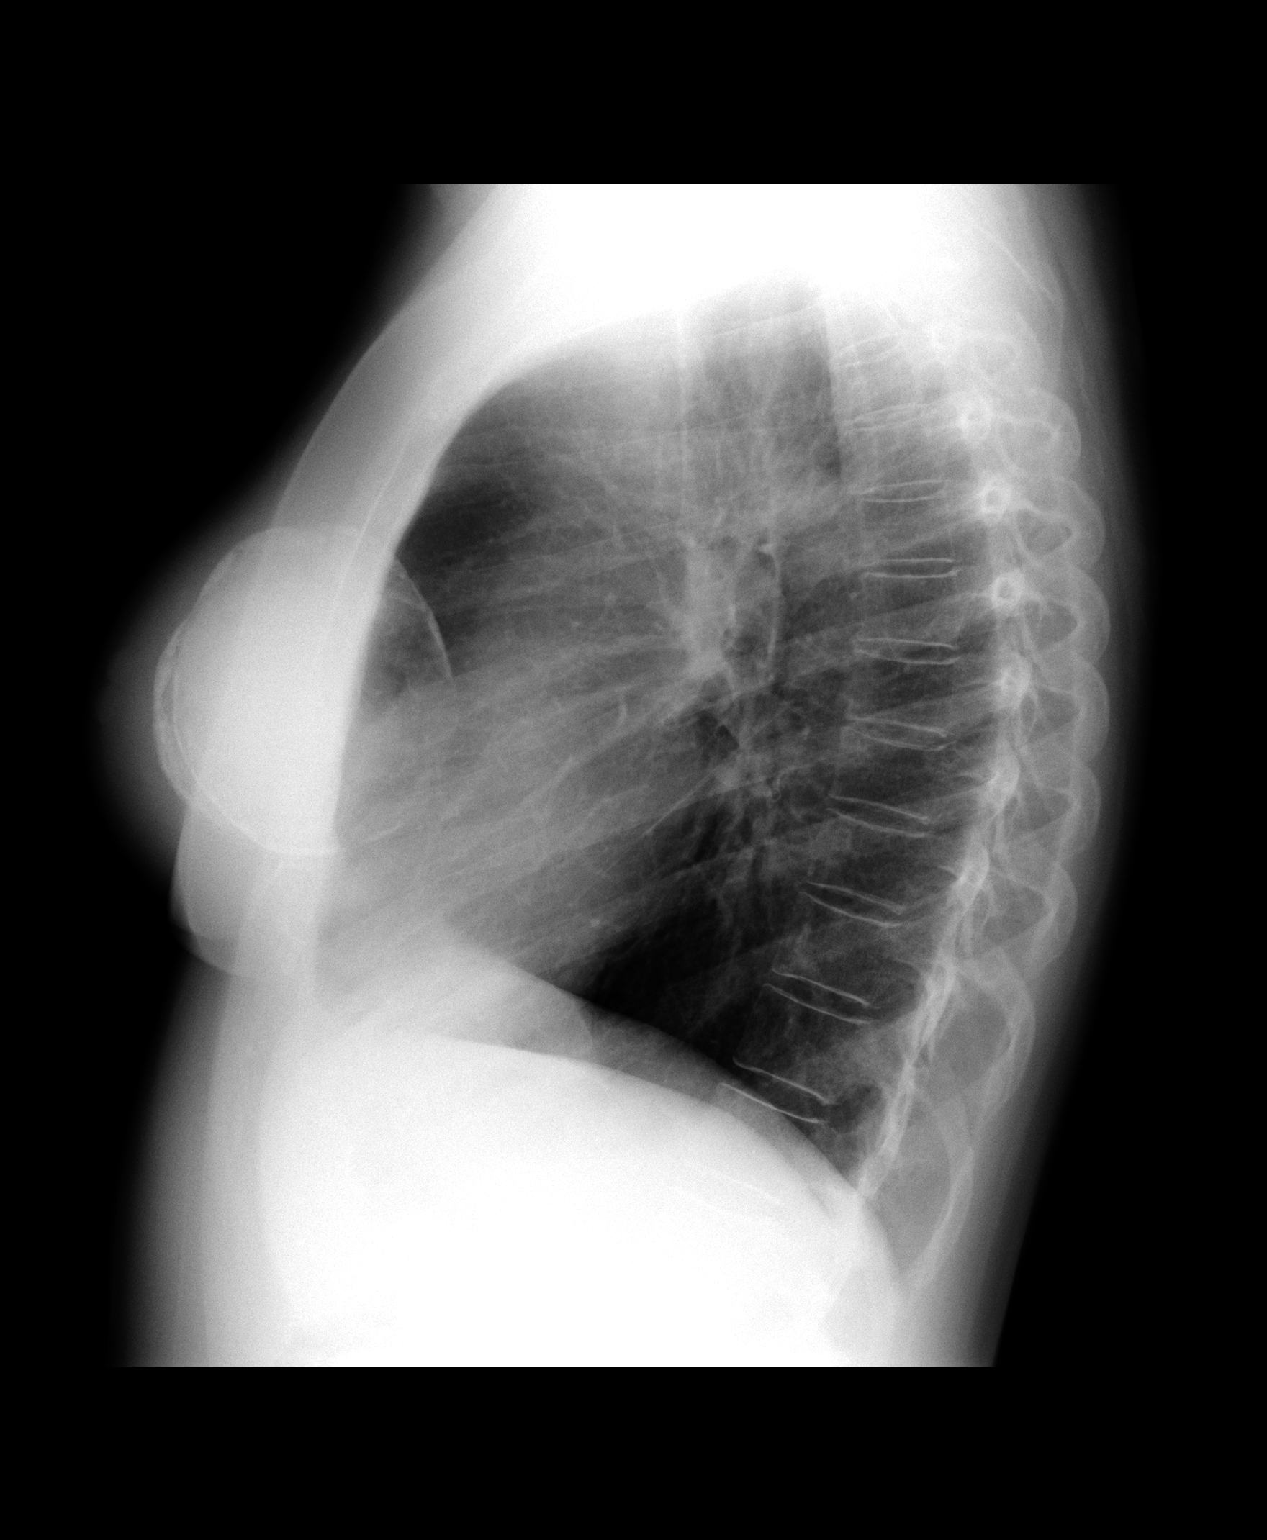

[2 of 2 positions shown; findings below may reference images not displayed]

FINDINGS: Mediastinum hilar structures normal. Lungs are clear. Heart size
normal. No pleural effusion or pneumothorax. Interval clearing of
right perihilar infiltrate. Bilateral breast calcified implants. No
acute bony abnormality.
IMPRESSION: Interval clearing of right perihilar infiltrate.

## 2016-03-24 ENCOUNTER — Ambulatory Visit: Payer: Medicare Other | Admitting: Endocrinology

## 2016-03-29 ENCOUNTER — Ambulatory Visit: Payer: Medicare Other | Admitting: Endocrinology

## 2016-05-13 ENCOUNTER — Other Ambulatory Visit: Payer: Self-pay | Admitting: Endocrinology

## 2016-05-13 NOTE — Telephone Encounter (Signed)
Patient needs office visit for further prescriptions

## 2016-09-23 IMAGING — CT CT PARANASAL SINUSES LIMITED
1 series · 16 of 24 positions shown, 20 images · non-contrast
Comparison: None.

CLINICAL DATA: History pneumonia with 2 rounds of antibiotics,
persistent cough and congestion

EXAM:
CT PARANASAL SINUS LIMITED WITHOUT CONTRAST
TECHNIQUE: Non-contiguous multidetector CT images of the paranasal sinuses were
obtained in a single plane without contrast.

[Series 5: ltd sinus 3.0 h30s · axial · 0.32mm/px · z∈[+1032,+1165]mm · 16 of 24 slices shown, 20 images]
[im 2/24  brain]
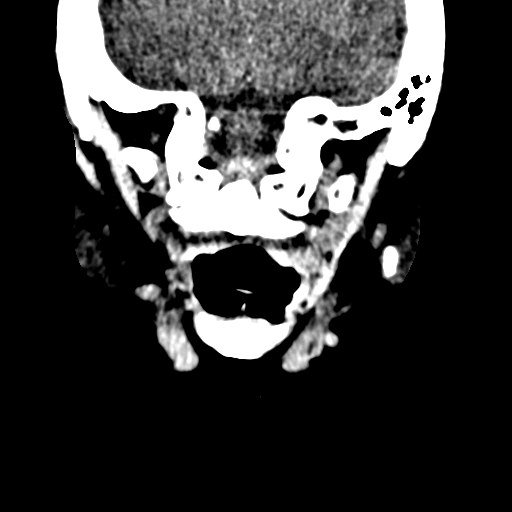
[im 2/24  bone]
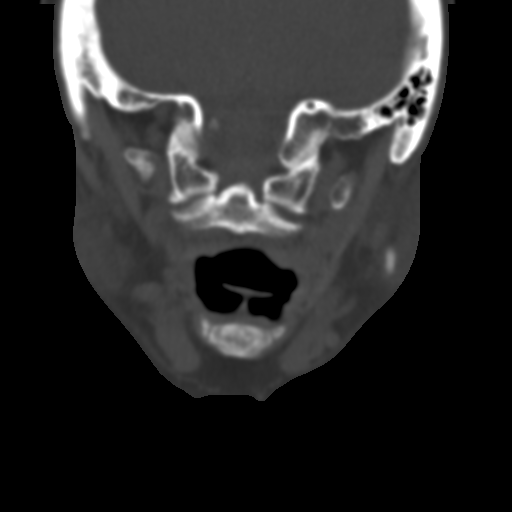
[im 4/24  bone]
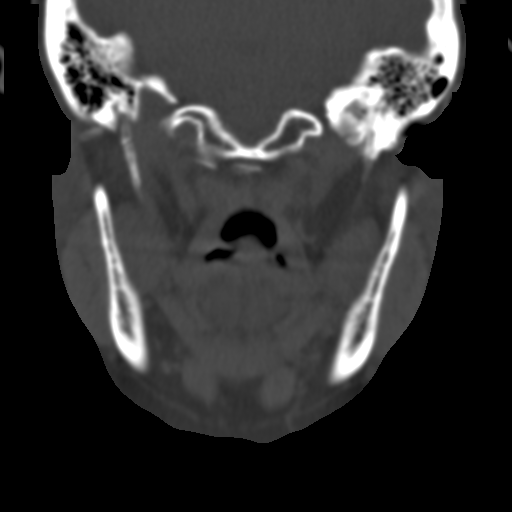
[im 5/24  bone]
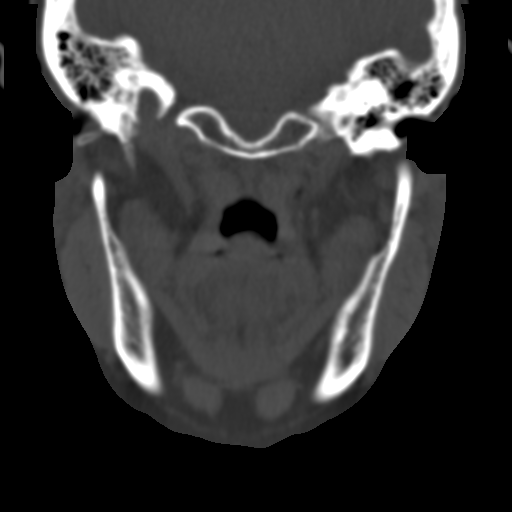
[im 6/24  bone]
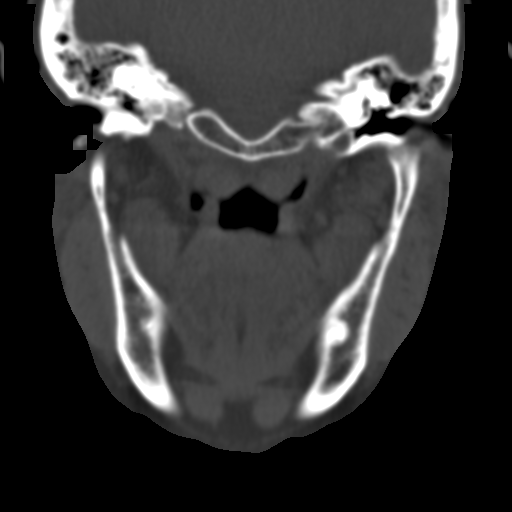
[im 8/24  brain]
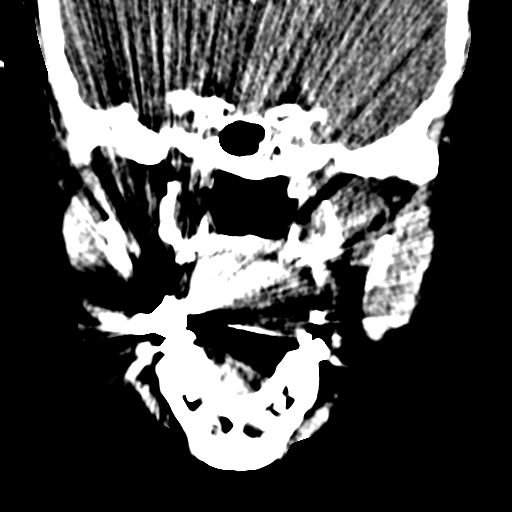
[im 8/24  bone]
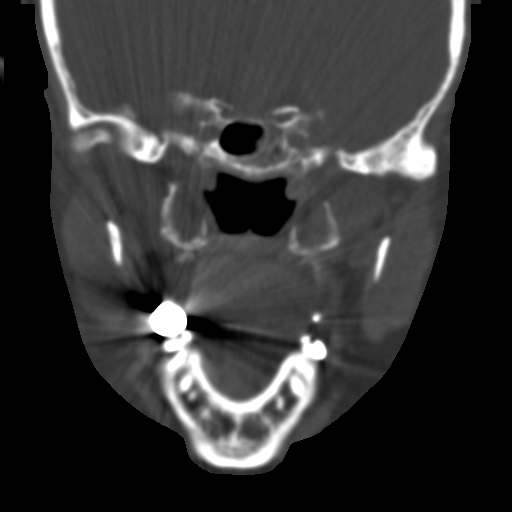
[im 9/24  bone]
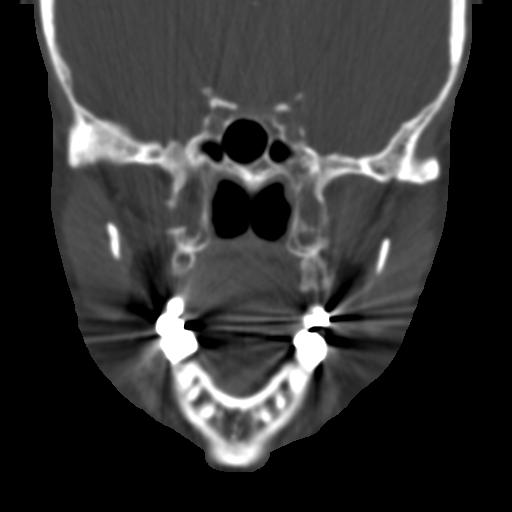
[im 10/24  bone]
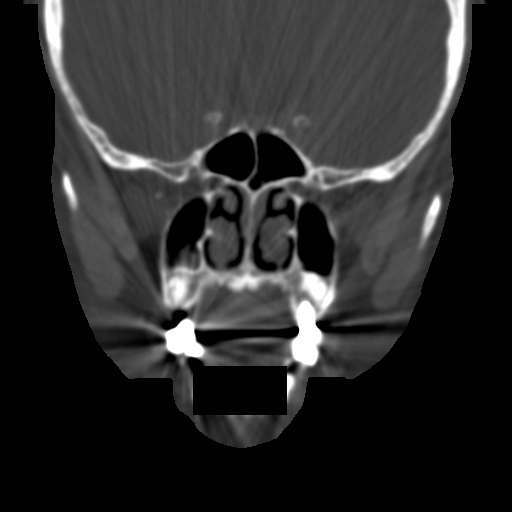
[im 12/24  bone]
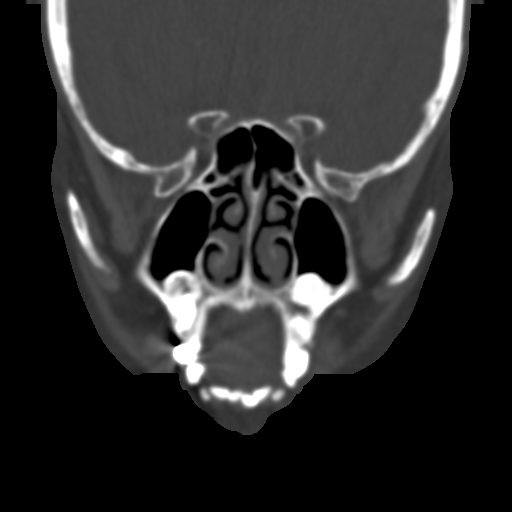
[im 13/24  brain]
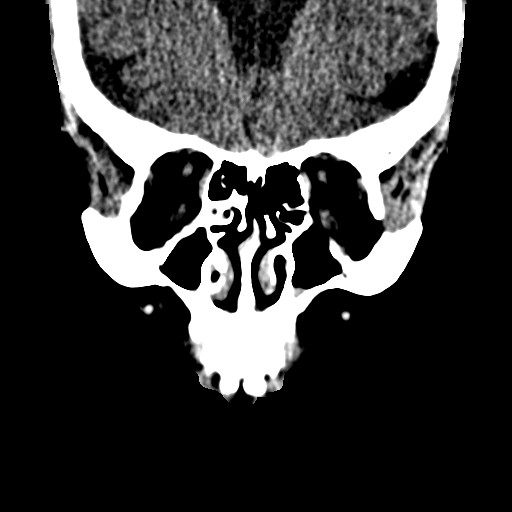
[im 13/24  bone]
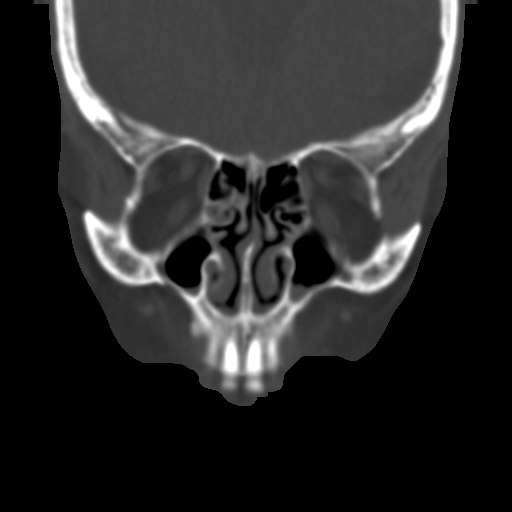
[im 15/24  bone]
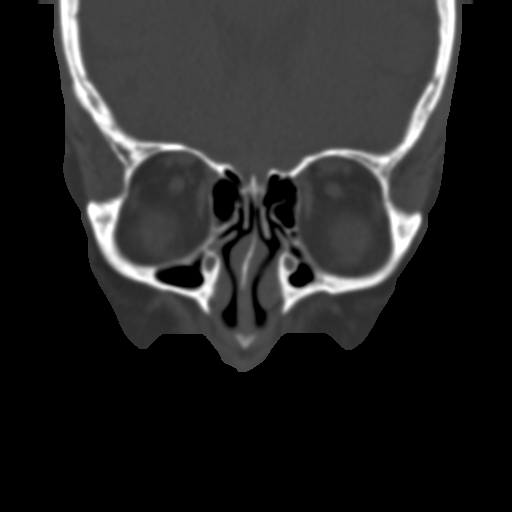
[im 16/24  bone]
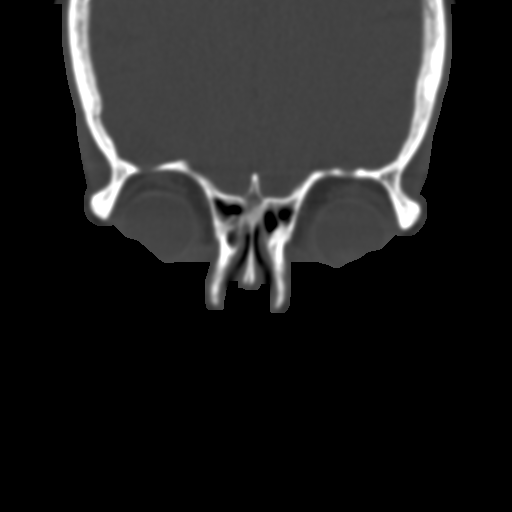
[im 17/24  bone]
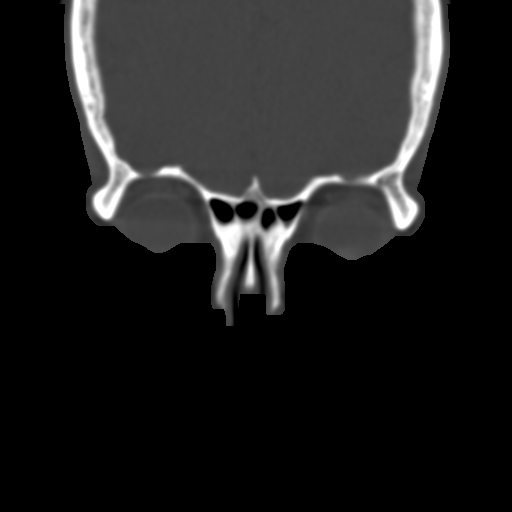
[im 19/24  brain]
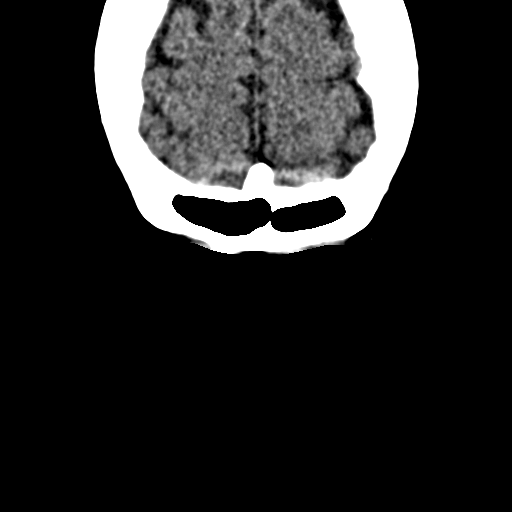
[im 19/24  bone]
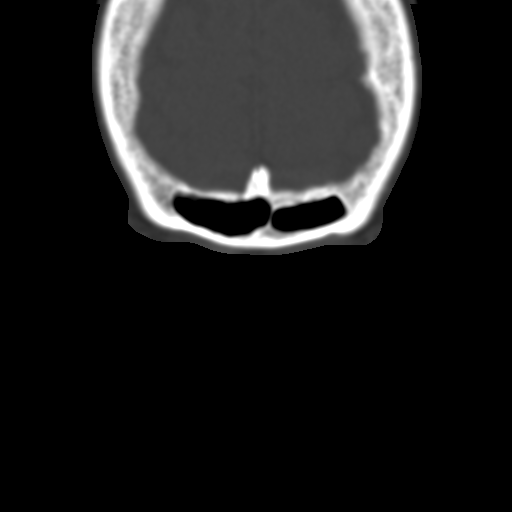
[im 20/24  bone]
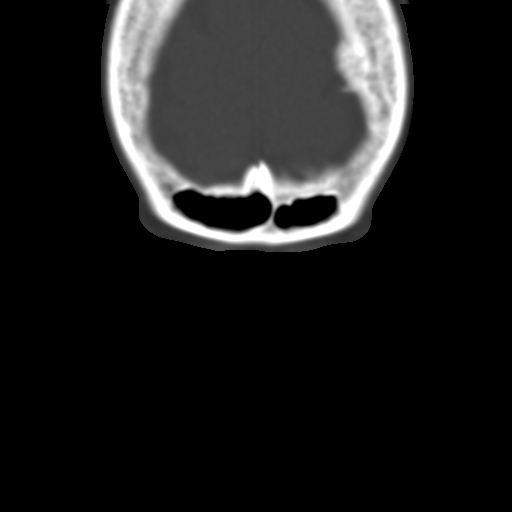
[im 21/24  bone]
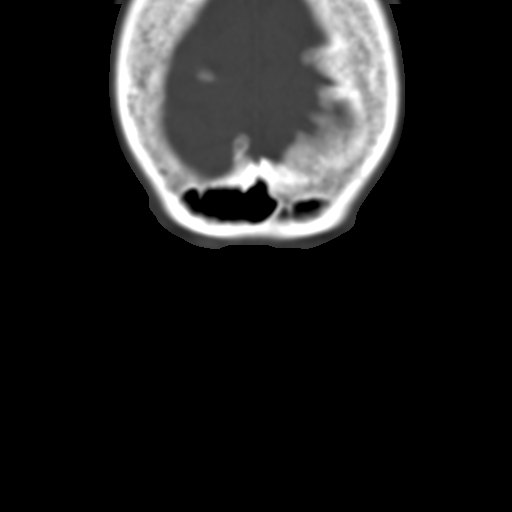
[im 23/24  bone]
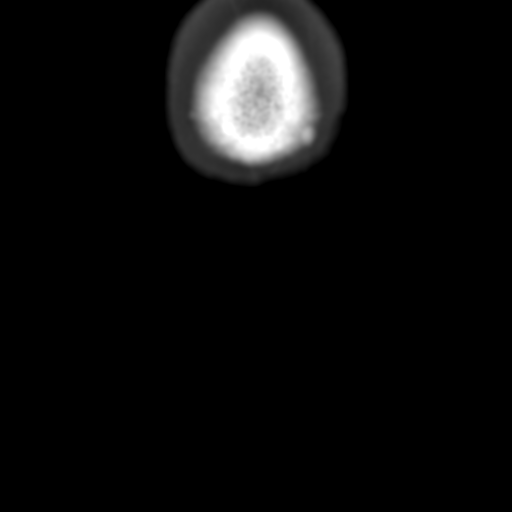

[16 of 24 positions shown; findings below may reference images not displayed]

FINDINGS: The paranasal sinuses appear well pneumatized. Minimal debris is
noted within the sphenoid sinus. Only tiny air-fluid levels are
noted in the floor of the maxillary sinuses which may indicate very
mild maxillary sinusitis. Also a tiny amount of fluid is noted
layering within the left frontal sinus. No other air-fluid level is
seen. The nasal turbinates are somewhat prominent, compromising the
nasal airway and there is slight nasal septal deviation to the right
of midline. No bony abnormality is seen.
IMPRESSION: 1. Small amount of fluid layers in the maxillary sinuses and the
left frontal sinus indicating mild sinusitis.
2. Compromise of the nasal airway by prominent terminates, and
possibly nasal mucosal edema.

## 2017-06-15 ENCOUNTER — Other Ambulatory Visit: Payer: Self-pay | Admitting: Endocrinology

## 2017-07-15 ENCOUNTER — Other Ambulatory Visit: Payer: Self-pay | Admitting: Endocrinology

## 2017-08-11 ENCOUNTER — Other Ambulatory Visit: Payer: Self-pay | Admitting: Endocrinology

## 2017-08-25 ENCOUNTER — Encounter: Payer: Self-pay | Admitting: Neurology

## 2017-08-28 ENCOUNTER — Encounter: Payer: Self-pay | Admitting: Neurology

## 2017-08-29 ENCOUNTER — Ambulatory Visit: Payer: Medicare Other | Admitting: Neurology

## 2017-08-29 ENCOUNTER — Encounter: Payer: Self-pay | Admitting: Neurology

## 2017-08-29 VITALS — BP 162/73 | HR 55 | Ht 61.0 in | Wt 113.8 lb

## 2017-08-29 DIAGNOSIS — G4733 Obstructive sleep apnea (adult) (pediatric): Secondary | ICD-10-CM | POA: Diagnosis not present

## 2017-08-29 DIAGNOSIS — G4710 Hypersomnia, unspecified: Secondary | ICD-10-CM | POA: Diagnosis not present

## 2017-08-29 DIAGNOSIS — G4730 Sleep apnea, unspecified: Secondary | ICD-10-CM

## 2017-08-29 DIAGNOSIS — Z789 Other specified health status: Secondary | ICD-10-CM

## 2017-08-29 DIAGNOSIS — G4719 Other hypersomnia: Secondary | ICD-10-CM

## 2017-08-29 DIAGNOSIS — F5104 Psychophysiologic insomnia: Secondary | ICD-10-CM | POA: Diagnosis not present
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
## 2017-08-29 NOTE — Patient Instructions (Signed)
Melatonin oral solid dosage forms   What is this medicine? MELATONIN (mel uh TOH nin) is a dietary supplement. It is mostly promoted to help maintain normal sleep patterns. The FDA has not approved this supplement for any medical use. This supplement may be used for other purposes; ask your health care provider or pharmacist if you have questions. This medicine may be used for other purposes; ask your health care provider or pharmacist if you have questions. COMMON BRAND NAME(S): Melatonex What should I tell my health care provider before I take this medicine? They need to know if you have any of these conditions: -cancer -depression or mental illness -diabetes -hormone problems -if you often drink alcohol -immune system problems -liver disease -lung or breathing disease, like asthma -organ transplant -seizure disorder -an unusual or allergic reaction to melatonin, other medicines, foods, dyes, or preservatives -pregnant or trying to get pregnant -breast-feeding How should I use this medicine? Take this supplement by mouth with a glass of water. Do not take with food. This supplement is usually taken 1 or 2 hours before bedtime. After taking this supplement, limit your activities to those needed to prepare for bed. Some products may be chewed or dissolved in the mouth before swallowing. Some tablets or capsules must be swallowed whole; do not cut, crush or chew. Follow the directions on the package labeling, or take as directed by your health care professional. Do not take this supplement more often than directed. Talk to your pediatrician regarding the use of this supplement in children. Special care may be needed. This supplement is not recommended for use in children without a prescription. Overdosage: If you think you have taken too much of this medicine contact a poison control center or emergency room at once. NOTE: This medicine is only for you. Do not share this medicine with  others. What if I miss a dose? If you miss taking your dose at the usual time, skip that dose. If it is almost time for your next dose, take only that dose. Do not take double or extra doses. What may interact with this medicine? Do not take this medicine with any of the following medications: -fluvoxamine -ramelteon -tasimelteon This medicine may also interact with the following medications: -alcohol -caffeine -carbamazepine -certain antibiotics like ciprofloxacin -certain medicines for depression, anxiety, or psychotic disturbances -cimetidine -female hormones, like estrogens and birth control pills, patches, rings, or injections -methoxsalen -nifedipine -other medications for sleep -other herbal or dietary supplements -phenobarbital -rifampin -smoking tobacco -tamoxifen -warfarin This list may not describe all possible interactions. Give your health care provider a list of all the medicines, herbs, non-prescription drugs, or dietary supplements you use. Also tell them if you smoke, drink alcohol, or use illegal drugs. Some items may interact with your medicine. What should I watch for while using this medicine? See your doctor if your symptoms do not get better or if they get worse. Do not take this supplement for more than 2 weeks unless your doctor tells you to. You may get drowsy or dizzy. Do not drive, use machinery, or do anything that needs mental alertness until you know how this medicine affects you. Do not stand or sit up quickly, especially if you are an older patient. This reduces the risk of dizzy or fainting spells. Alcohol may interfere with the effect of this medicine. Avoid alcoholic drinks. Talk to your doctor before you use this supplement if you are currently being treated for an emotional, mental, or sleep problem.  This medicine may interfere with your treatment. Herbal or dietary supplements are not regulated like medicines. Rigid quality control standards are  not required for dietary supplements. The purity and strength of these products can vary. The safety and effect of this dietary supplement for a certain disease or illness is not well known. This product is not intended to diagnose, treat, cure or prevent any disease. The Food and Drug Administration suggests the following to help consumers protect themselves: -Always read product labels and follow directions. -Natural does not mean a product is safe for humans to take. -Look for products that include USP after the ingredient name. This means that the manufacturer followed the standards of the [name] Pharmacopoeia. -Supplements made or sold by a nationally known food or drug company are more likely to be made under tight controls. You can write to the company for more information about how the product was made. What side effects may I notice from receiving this medicine? Side effects that you should report to your doctor or health care professional as soon as possible: -allergic reactions like skin rash, itching or hives, swelling of the face, lips, or tongue -breathing problems -confusion -depressed mood, irritable, or other changes in moods or behaviors -feeling faint or lightheaded, falls -increased blood pressure -irregular or missed menstrual periods -signs and symptoms of liver injury like dark yellow or brown urine; general ill feeling or flu-like symptoms; light-colored stools; loss of appetite; nausea; right upper belly pain; unusually weak or tired; yellowing of the eyes or skin -trouble staying awake or alert during the day -unusual activities while you are still asleep like driving, eating, making phone calls -unusual bleeding or bruising Side effects that usually do not require medical attention (report to your doctor or health care professional if they continue or are bothersome): -dizziness -drowsiness -headache -hot flashes -nausea -tiredness -unusual dreams or  nightmares -upset stomach This list may not describe all possible side effects. Call your doctor for medical advice about side effects. You may report side effects to FDA at 1-800-FDA-1088. Where should I keep my medicine? Keep out of the reach of children. Store at room temperature or as directed on the package label. Protect from moisture. Throw away any unused supplement after the expiration date. NOTE: This sheet is a summary. It may not cover all possible information. If you have questions about this medicine, talk to your doctor, pharmacist, or health care provider.  2018 Elsevier/Gold Standard (2016-02-23 14:38:22)   Please remember to try to maintain good sleep hygiene, which means: Keep a regular sleep and wake schedule, try not to exercise or have a meal within 2 hours of your bedtime, try to keep your bedroom conducive for sleep, that is, cool and dark, without light distractors such as an illuminated alarm clock, and refrain from watching TV right before sleep or in the middle of the night and do not keep the TV or radio on during the night. Also, try not to use or play on electronic devices at bedtime, such as your cell phone, tablet PC or laptop. If you like to read at bedtime on an electronic device, try to dim the background light as much as possible. Do not eat in the middle of the night.   We will request a sleep study.    We will look for leg twitching and snoring or sleep apnea.   For chronic insomnia, you are best followed by a psychiatrist and/or sleep psychologist.   We will call you  with the sleep study results and make a follow up appointment if needed.

## 2017-08-29 NOTE — Addendum Note (Signed)
Addended by: [name] on: 08/29/2017 11:18 AM   Modules accepted: Orders

## 2017-09-06 LAB — NARCOLEPSY EVALUATION
HLA-DQ ALPHA: NEGATIVE
HLA-DQ BETA: NEGATIVE

## 2017-09-08 ENCOUNTER — Telehealth: Payer: Self-pay | Admitting: Neurology

## 2017-09-08 NOTE — Telephone Encounter (Signed)
-----  Message from [name], MD sent at 09/06/2017  4:45 PM EDT ----- Negative narcolepsy HLA

## 2017-09-08 NOTE — Telephone Encounter (Signed)
Called the patient, no answer. LVM informing the pt that the lab work came back negative for carrying the narcolepsy gene. LVM also stating that someone will be contacting her about sleep studies. Instructed the pt to call back if she had questions.

## 2017-10-10 ENCOUNTER — Ambulatory Visit (INDEPENDENT_AMBULATORY_CARE_PROVIDER_SITE_OTHER): Payer: Medicare Other | Admitting: Neurology

## 2017-10-10 DIAGNOSIS — G4733 Obstructive sleep apnea (adult) (pediatric): Secondary | ICD-10-CM

## 2017-10-10 DIAGNOSIS — F5104 Psychophysiologic insomnia: Secondary | ICD-10-CM

## 2017-10-10 DIAGNOSIS — G4710 Hypersomnia, unspecified: Secondary | ICD-10-CM

## 2017-10-10 DIAGNOSIS — G4719 Other hypersomnia: Secondary | ICD-10-CM

## 2017-10-10 DIAGNOSIS — G4730 Sleep apnea, unspecified: Secondary | ICD-10-CM

## 2017-10-11 ENCOUNTER — Ambulatory Visit (INDEPENDENT_AMBULATORY_CARE_PROVIDER_SITE_OTHER): Payer: Medicare Other | Admitting: Neurology

## 2017-10-11 DIAGNOSIS — G4719 Other hypersomnia: Secondary | ICD-10-CM

## 2017-10-11 DIAGNOSIS — G47419 Narcolepsy without cataplexy: Secondary | ICD-10-CM

## 2017-10-11 DIAGNOSIS — F5104 Psychophysiologic insomnia: Secondary | ICD-10-CM

## 2017-10-11 DIAGNOSIS — G4710 Hypersomnia, unspecified: Secondary | ICD-10-CM

## 2017-10-11 DIAGNOSIS — G4730 Sleep apnea, unspecified: Secondary | ICD-10-CM

## 2017-10-20 NOTE — Procedures (Signed)
  Name:  Elizabeth Walker, Elizabeth Walker Reference [id number]  Study Date: 10/11/2017 Procedure #: 2002  DOB: 02-16-1946    Protocol  This is a 13 channel Multiple Sleep Latency Test comprised of 5 channels of EEG (T3-Cz, Cz-T4, F4-M1, C4-M1, O2-M1), 3 channels of Chin EMG, 4 channels of EOG and 1 channel for ECG.   All channels were sampled at 256hz .    This polysomnographic procedure is designed to evaluate (1) the complaint of excessive daytime sleepiness by quantifying the time required to fall asleep and (2) the possibility of narcolepsy by checking for abnormally short latencies to REM sleep.  Electrographic variables include EEG, EMG, EOG and ECG.  Patients are monitored throughout four or five 20-minute opportunities to sleep (naps) at two-hour intervals.  For each nap, the patient is allowed 20 minutes to fall asleep.  Once asleep, the patient is awakened after 15 minutes.  Between naps, the patient is kept as alert as possible.  A sleep latency of 20 minutes indicates that no sleep occurred.  Parametric Analysis  Total Number of Naps 4     NAP # Time of Nap  Sleep Latency (mins) REM Latency (mins) Sleep Time Percent Awake Time Percent  1 07:35 20 0 0 100   2 09:34 20 0 0  100   3 11:36 8.5 0 55  45   4 13:19 20 0 0  100   5 NA NA NA NA            NA    MSLT Summary of Naps  Mean Sleep Latency to First Four Naps: 17.1    Results from Preceding PSG Study  Sleep Onset Time 21:15 Sleep Efficiency (%) 84.4  Rise Time 06:01 Sleep Latency (min) 16  Total Sleep Time  458 REM Latency (min) 22.8     IMPRESSION:  1. This multiple sleep latency test reveals a mean sleep latency of 17.1 minutes with 0 sleep periods during which REM sleep was recorded.  A total of 4 nap opportunities were given I and only 1 sleep period was recorded.   2. This study was preceded by an overnight polysomnogram with a REM sleep latency of 22.8 minutes and very short sleep latency of 16 minutes.    Name:  [name], [name] Reference #:  [id number]  Study Date: 10/11/2017 DOB: 04-10-1946   I attest to having reviewed every epoch of the entire raw data recording prior to the issuance of this report in accordance with the Standards of the American Academy of Sleep Medicine.    RECOMMENDATIONS: This MSLT study is neither consistent with hypersomnia nor diagnostic for narcolepsy. The preceding PSG was highly suggestive of narcolepsy due to short REM and overall sleep latency. I will meet with the patient to see if there were any external influences on her daytime sleep pattern.  CPAP therapy may have allowed for the sleepiness to be successfully reduced.      [name], M.D.    10-20-2017  Diplomat, American Board of Psychiatry and Neurology  Diplomat, [place] [facility] Medical Director, [place] [facility]

## 2017-10-20 NOTE — Procedures (Signed)
PATIENT'S NAME:  Elizabeth Walker, Elizabeth Walker DOB:      01/31/1946      MR#:    [id number]     DATE OF RECORDING: 10/10/2017 REFERRING M.D.:  Crist Infante MD Study Performed:   Baseline sleep study on CPAP for MSLT to follow.  HISTORY:  Mrs. Elizabeth Walker is an established BiPAP user with good compliance but persistent excessive daytime sleepiness, and here for CPAP study prior to MSLT study. She has been followed by two outside sleep clinics until recently and was placed on BiPAP, was tired of PAP therapy. She requested originally a referral for INSPIRE procedure and needed a new baseline sleep study for this. This revealed a favorable response to CPAP.  The patient endorsed the Epworth Sleepiness Scale at 17 points and the Fatigue Score at 54 points.   The patient's weight 112 pounds with a height of 61 (inches), resulting in a BMI of 21.2 kg/m2. The patient's neck circumference measured 15 inches.  CURRENT MEDICATIONS: Tylenol, Lipitor, Bystolic, Pepcid, Avapro, Dulera, Nitrostat, Zantac  PROCEDURE:  This is a multichannel digital polysomnogram utilizing the SomnoStar 11.2 system.  Electrodes and sensors were applied and monitored per AASM Specifications.   EEG, EOG, Chin and Limb EMG, were sampled at 200 Hz.  ECG, Snore and Nasal Pressure, Thermal Airflow, Respiratory Effort, CPAP Flow and Pressure, Oximetry was sampled at 50 Hz. Digital video and audio were recorded.      CPAP was initiated at 10 cmH20 with heated humidity per AASM split night standards and pressure was advanced to 10/10cmH20 because of hypopneas, apneas and desaturations.  At a PAP pressure of 10 cmH20, there was a reduction of the AHI to 0.3 with improvement of sleep apnea.  Lights Out was at 20:59 and Lights On at 06:01. Total recording time (TRT) was 542.5 minutes, with a total sleep time (TST) of 458 minutes. The patient's sleep latency was 95 minutes. REM latency was 63 minutes.  The sleep efficiency was 84.4 %.    SLEEP ARCHITECTURE: WASO  (Wake after sleep onset) was 68.5 minutes.  There were 48 minutes in Stage N1, 96 minutes Stage N2, 209.5 minutes Stage N3 and 104.5 minutes in Stage REM.  The percentage of Stage N1 was 10.5%, Stage N2 was 21.%, Stage N3 was 45.7% and Stage R (REM sleep) was 22.8%. The sleep architecture was notable for early REM onset and many REM cycles.   RESPIRATORY ANALYSIS:  There was a total of 2 respiratory events: 0 apneas and 2 hypopneas with 0 respiratory event related arousals (RERAs). The total APNEA/HYPOPNEA INDEX (AHI) was 0.3 /hour and the total RESPIRATORY DISTURBANCE INDEX was 0.3 /hour.  The patient spent 237.5 minutes of total sleep time in the supine position and 221 minutes in non-supine. The supine AHI was 0.5, versus a non-supine AHI of 0.0.  OXYGEN SATURATION & C02:  The baseline 02 saturation was 98%, with the lowest being 89%. Time spent below 89% saturation equaled 0 minutes.  PERIODIC LIMB MOVEMENTS:  The patient had a total of 0 Periodic Limb Movements.  The arousals were noted as: 105 were spontaneous, 0 were associated with PLMs, and 1 was associated with respiratory events.  Audio and video analysis did not show any abnormal or unusual movements, behaviors, phonations or vocalizations. No Snoring was noted. EKG was in keeping with normal sinus rhythm (NSR). The patient was fitted with a Respironics Dream wear nasal cradle mask in small size pillows.  DIAGNOSIS 1) Alleviation of OSA under 10  cm water pressure CPAP, 2 cm EPR, heated humidity.  The patient was changed from a FFM to a nasal pillow.  Early REM onset, suspicious for narcolepsy.   PLANS/RECOMMENDATIONS: The AHI of less than 5 allows for MSLT to follow.  A follow up appointment will be scheduled in the Sleep Clinic at [facility].   Please call [phone] with any questions.     I certify that I have reviewed the entire raw data recording prior to the issuance of this report in accordance with the  Standards of Accreditation of the American Academy of Sleep Medicine (AASM)   [name], M.D.  10-19-2017  Diplomat, American Board of Psychiatry and Neurology  Diplomat, [place] of Sleep Medicine Medical Director, [place] [facility]

## 2017-10-21 ENCOUNTER — Telehealth: Payer: Self-pay | Admitting: *Deleted

## 2017-10-21 NOTE — Telephone Encounter (Addendum)
Called pt and informed her that her MSLT did not confirm narcolepsy, looks as though her CPAP alleviated the sleepiness. Pt verbalized understanding and asked about her sleep study. RN advised her that she did well on her CPAP the night before the MSLT. The pt stated she had more questions about her sleep study and if follow up will be needed. RN advised we will call back. She verbalized appreciation.   ----- Message from [name], MD sent at 10/20/2017  6:05 PM EDT ----- The MSLT was not abnormal - since it followed a very good night sleep on CPAP it is entirely possible that the PAP therapy alleviated the sleepiness. See comment on PSG on PAP sleep architecture- there was high REM pressure the night before the MSLT.

## 2017-10-24 ENCOUNTER — Telehealth: Payer: Self-pay | Admitting: Neurology

## 2017-10-24 ENCOUNTER — Other Ambulatory Visit: Payer: Self-pay | Admitting: Neurology

## 2017-10-24 DIAGNOSIS — G4733 Obstructive sleep apnea (adult) (pediatric): Secondary | ICD-10-CM

## 2017-10-24 NOTE — Telephone Encounter (Signed)
Called and went over the patient sleep study and MSLT results. The patient had already received the results from another nurse but im glad I called as well because her concern was the fact that she cant use the current machine she is using because the pressure is too much, the patient during the night time study used a nasal mask which she was able to tolerate and the pressure was set at 10 cm of water pressure which also she tolerated and treated her apnea, the patient is currently set up on a Bipap pressure. I have informed the pt I will talk with Dr Brett Fairy about just changing her pressures to machine and see if maybe just using that would help. Pt verbalized understanding and was appreciative.

## 2017-10-24 NOTE — Telephone Encounter (Signed)
-----   Message from [name], MD sent at 10/20/2017  5:55 PM EDT ----- PSG in 10 cm water CPAP was valid for MSLT to follow. CD

## 2017-12-27 ENCOUNTER — Other Ambulatory Visit: Payer: Self-pay | Admitting: Neurology

## 2017-12-27 ENCOUNTER — Telehealth: Payer: Self-pay | Admitting: Neurology

## 2017-12-27 DIAGNOSIS — G4733 Obstructive sleep apnea (adult) (pediatric): Secondary | ICD-10-CM

## 2017-12-27 NOTE — Telephone Encounter (Signed)
Pt has called stating that she is having the most difficult time getting mask and tubing for her CPAP.  Pt states she would very much like to know they type mask that was used during her sleep study.  Pt states that mask would be ideal for what she'd like to have.  Pt states that she has no one monitoring her CPAP.  Pt is asking to be called

## 2017-12-27 NOTE — Telephone Encounter (Signed)
Patient has called asking to switch DME companies because she has had difficulty with getting the appropriate equipment to be able to use her machine effectively. The patient also was upset that she didn't have a follow up with [name]. I informed her that we can certainly get her set up with a  Follow up apt. Since she appears on airview that she hasnt been compliant (due to DME and not pt) we would push out and get her in early sept. I set her up sept 10,2019 at 8:30 am. Pt would like a later apt and I have added her to waitlist if something comes open later we can move her to a later in the day apt.  Will send transfer of care orders to [facility] for the patient.

## 2018-01-02 ENCOUNTER — Other Ambulatory Visit: Payer: Self-pay | Admitting: Neurology

## 2018-01-02 ENCOUNTER — Telehealth: Payer: Self-pay | Admitting: Neurology

## 2018-01-02 DIAGNOSIS — G4733 Obstructive sleep apnea (adult) (pediatric): Secondary | ICD-10-CM

## 2018-01-02 NOTE — Telephone Encounter (Signed)
Pt has called to inform that she just left [facility] and was told she has a BiPap, pt was told by [facility]  That Dr Dohmeier needs to write an Rx for a CPAP.  Please call

## 2018-01-03 NOTE — Telephone Encounter (Signed)
Spoke with [facility] to confirm that her machine is working per the orders that are sent. I called the patient. The patient's current machine was purchased in 2017 and is a BIPAP  Machine. The machine has the capability to use as a CPAP in which when I pull the patients data it is set as a CPAP pressure of 10 cm. I advised the patient that the pressure being set at 10 cm we noticed the patient is still having a AHI of 11. I informed her that Dr Brett Fairy reviewed the download and has advised to increase the pressure to 12 cm of water pressure. Sent the order to [place] for the patient. Reassured the patient that the current machine is working and treating her as the Dr has ordered.  Pt verbalized understanding. Pt had no questions at this time but was encouraged to call back if questions arise.

## 2018-02-21 ENCOUNTER — Ambulatory Visit: Payer: Self-pay | Admitting: Neurology

## 2018-02-22 ENCOUNTER — Encounter: Payer: Self-pay | Admitting: Neurology

## 2018-02-22 ENCOUNTER — Ambulatory Visit: Payer: Medicare Other | Admitting: Neurology

## 2018-02-22 VITALS — BP 128/68 | HR 66 | Ht 60.0 in | Wt 116.0 lb

## 2018-02-22 DIAGNOSIS — F5104 Psychophysiologic insomnia: Secondary | ICD-10-CM | POA: Diagnosis not present

## 2018-02-22 DIAGNOSIS — G4719 Other hypersomnia: Secondary | ICD-10-CM | POA: Diagnosis not present

## 2018-02-22 DIAGNOSIS — J329 Chronic sinusitis, unspecified: Secondary | ICD-10-CM

## 2018-02-22 DIAGNOSIS — G4730 Sleep apnea, unspecified: Secondary | ICD-10-CM

## 2018-02-22 DIAGNOSIS — G4710 Hypersomnia, unspecified: Secondary | ICD-10-CM | POA: Diagnosis not present
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
## 2018-02-22 NOTE — Patient Instructions (Signed)
Please remember to try to maintain good sleep hygiene, which means: Keep a regular sleep and wake schedule, try not to exercise or have a meal within 2 hours of your bedtime, try to keep your bedroom conducive for sleep, that is, cool and dark, without light distractors such as an illuminated alarm clock, and refrain from watching TV right before sleep or in the middle of the night and do not keep the TV or radio on during the night. Also, try not to use or play on electronic devices at bedtime, such as your cell phone, tablet PC or laptop. If you like to read at bedtime on an electronic device, try to dim the background light as much as possible. Do not eat in the middle of the night.   We will request a Home sleep study.    For chronic insomnia, you are best followed by a psychiatrist and/or sleep psychologist.   We will call you with the sleep study results and make a follow up appointment if needed.

## 2018-03-06 ENCOUNTER — Ambulatory Visit (INDEPENDENT_AMBULATORY_CARE_PROVIDER_SITE_OTHER): Payer: Medicare Other | Admitting: Neurology

## 2018-03-06 DIAGNOSIS — G4719 Other hypersomnia: Secondary | ICD-10-CM

## 2018-03-06 DIAGNOSIS — G4710 Hypersomnia, unspecified: Secondary | ICD-10-CM | POA: Diagnosis not present

## 2018-03-06 DIAGNOSIS — G4730 Sleep apnea, unspecified: Secondary | ICD-10-CM

## 2018-03-06 DIAGNOSIS — F5104 Psychophysiologic insomnia: Secondary | ICD-10-CM

## 2018-03-06 DIAGNOSIS — J329 Chronic sinusitis, unspecified: Secondary | ICD-10-CM

## 2018-03-08 NOTE — Procedures (Signed)
NAME:  Elizabeth Walker                                                                DOB: [birth year] MEDICAL RECORD NUMBER [id number]                                            DOS:  03/06/2018 REFERRING PHYSICIAN: Crist Infante, M.D. STUDY PERFORMED: Home Sleep Study by Gweneth Dimitri 02-22-2018 Revisit with [name]. [place], a Caucasian, right handed 72 year old female patient seen here for transfer of sleep care/ apnea treatment. She reports an ongoing struggle with insomnia.   [name] had been using BiPAP by an outside sleep clinic- she then underwent a baseline sleep study on CPAP here at [facility]- 10-10-2017, meant for MSLT to follow.  On CPAP her AHI was 0.3, she was using 10 cmH2O pressure with heated humidity and a nasal pillow.  She had used a full facemask at the beginning of the study, which switched to nasal pillows.  The study was followed by an MSLT, but she did not have here more than one nap with sleep onset and no REM sleep onset at all.   [name] is 83% compliant with CPAP at an average user time of 5 hours 41 minutes, she felt more comfortable now with a nasal pillow interface and with a full facemask she used to have.  CPAP is re-set at 12 cmH2O pressure with 3 cm EPR (we had identified CPAP 10 cm as optimal during the PSG night, but her first downloads showed high AHI). Residual AHI is 4.3/h on nasal pillows.  While I cannot make a diagnosis of narcolepsy I can certainly say that the patient has fragmented sleep, insomnia is confirmed by the long sleep latency we witnessed in the lab, and that her residual apnea index on CPAP is no longer high enough to explain hypersomnia.  BMI: 22.9  STUDY RESULTS: Total Recording Time: 10 hours 49 minutes; valid test time of 7h and 37 min.  Total Apnea/Hypopnea Index (AHI): 51.9 /h: RDI: 52.4 /h. Average Oxygen Saturation: 94 %; Lowest Oxygen Saturation: 78 %.  Total Time in Oxygen Saturation below 89 %:  0.5 minutes.  Average Heart Rate: 59 bpm (between 50 and  79 bpm). IMPRESSION:  Severe Sleep Apnea, Obstructive.  Nearly all recorded time in supine sleep position with less hypoxemia while in non -supine position. Sleep remains very fragmented- pain related arousals?  RECOMMENDATION: Avoiding supine sleep if possible. Continue using CPAP as currently set.  I certify that I have reviewed the raw data recording prior to the issuance of this report in accordance with the standards of the American Academy of Sleep Medicine (AASM). [name], M.D.  03-08-2018      Medical Director of [facility] at [facility], accredited by the AASM. Diplomat of the ABPN and ABSM.

## 2018-03-09 ENCOUNTER — Telehealth: Payer: Self-pay | Admitting: Neurology

## 2018-03-09 NOTE — Telephone Encounter (Signed)
-----   Message from [name], MD sent at 03/08/2018  5:26 PM EDT ----- There was clear evidence of severe OSA on this Home sleep test, therfore the CPAP needs to stay- while she seems not in need of BiPAP modality at all.  The nasal pillow is fine if the patient finds it comfortable.    IMPRESSION: Severe Sleep Apnea, Obstructive. AHI of 51.9/h  Nearly all recorded time in supine sleep position with less  hypoxemia while in non -supine position. Sleep remains very  fragmented- pain related arousals?  RECOMMENDATION: Avoiding supine sleep if possible. Continue using  CPAP as currently set.

## 2018-03-09 NOTE — Telephone Encounter (Signed)
I called pt. I advised pt that Dr. Brett Fairy reviewed their sleep study results and found that pt has severe sleep apnea still present. Dr. Brett Fairy recommends that pt continue to stay on the current CPAP treatment and advised the patient to attempt to make sure she is not sleeping on her back. Pt verbalized understanding of results. Pt had no questions at this time but was encouraged to call back if questions arise. Pt has an upcoming apt in December and we will discuss more at that time.

## 2018-05-23 ENCOUNTER — Encounter: Payer: Self-pay | Admitting: Neurology

## 2018-05-25 ENCOUNTER — Ambulatory Visit: Payer: Medicare Other | Admitting: Neurology

## 2018-05-25 ENCOUNTER — Other Ambulatory Visit: Payer: Self-pay | Admitting: Neurology

## 2018-05-25 ENCOUNTER — Encounter: Payer: Self-pay | Admitting: Neurology

## 2018-05-25 VITALS — BP 114/61 | HR 59 | Ht 60.5 in | Wt 115.0 lb

## 2018-05-25 DIAGNOSIS — F5104 Psychophysiologic insomnia: Secondary | ICD-10-CM | POA: Diagnosis not present

## 2018-05-25 DIAGNOSIS — G4733 Obstructive sleep apnea (adult) (pediatric): Secondary | ICD-10-CM

## 2018-05-25 DIAGNOSIS — G4710 Hypersomnia, unspecified: Secondary | ICD-10-CM | POA: Diagnosis not present

## 2018-05-25 DIAGNOSIS — G4730 Sleep apnea, unspecified: Secondary | ICD-10-CM

## 2018-05-25 DIAGNOSIS — J329 Chronic sinusitis, unspecified: Secondary | ICD-10-CM

## 2018-05-25 DIAGNOSIS — Z789 Other specified health status: Secondary | ICD-10-CM

## 2018-05-25 DIAGNOSIS — H8110 Benign paroxysmal vertigo, unspecified ear: Secondary | ICD-10-CM

## 2018-05-25 NOTE — Patient Instructions (Addendum)
Vestibular rehab ordered, auto CPAP re-ordered.   Chronic insomnia- counseling needed.    ENT re-evaluation- Dr. Erling Cruz. She is interested in inspire procedure. Dr Wilburn Cornelia and [facility] ? .
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
## 2018-05-31 ENCOUNTER — Other Ambulatory Visit: Payer: Self-pay | Admitting: Neurology

## 2018-05-31 DIAGNOSIS — Z789 Other specified health status: Secondary | ICD-10-CM

## 2018-05-31 DIAGNOSIS — G4719 Other hypersomnia: Secondary | ICD-10-CM

## 2018-05-31 DIAGNOSIS — G4733 Obstructive sleep apnea (adult) (pediatric): Secondary | ICD-10-CM

## 2018-05-31 DIAGNOSIS — G4730 Sleep apnea, unspecified: Secondary | ICD-10-CM

## 2018-06-03 ENCOUNTER — Emergency Department (HOSPITAL_BASED_OUTPATIENT_CLINIC_OR_DEPARTMENT_OTHER)
Admission: EM | Admit: 2018-06-03 | Discharge: 2018-06-03 | Disposition: A | Discharge status: Home / Self Care | Payer: Medicare Other | Attending: Emergency Medicine | Admitting: Emergency Medicine

## 2018-06-03 ENCOUNTER — Encounter (HOSPITAL_BASED_OUTPATIENT_CLINIC_OR_DEPARTMENT_OTHER): Payer: Self-pay | Admitting: Emergency Medicine

## 2018-06-03 ENCOUNTER — Emergency Department (HOSPITAL_BASED_OUTPATIENT_CLINIC_OR_DEPARTMENT_OTHER): Payer: Medicare Other

## 2018-06-03 ENCOUNTER — Other Ambulatory Visit: Payer: Self-pay

## 2018-06-03 DIAGNOSIS — R0981 Nasal congestion: Secondary | ICD-10-CM | POA: Diagnosis present

## 2018-06-03 DIAGNOSIS — H81399 Other peripheral vertigo, unspecified ear: Secondary | ICD-10-CM | POA: Insufficient documentation

## 2018-06-03 DIAGNOSIS — Z79899 Other long term (current) drug therapy: Secondary | ICD-10-CM | POA: Insufficient documentation

## 2018-06-03 DIAGNOSIS — J0101 Acute recurrent maxillary sinusitis: Secondary | ICD-10-CM | POA: Insufficient documentation

## 2018-06-03 DIAGNOSIS — I10 Essential (primary) hypertension: Secondary | ICD-10-CM | POA: Diagnosis not present

## 2018-06-03 DIAGNOSIS — J0100 Acute maxillary sinusitis, unspecified: Secondary | ICD-10-CM

## 2018-06-03 DIAGNOSIS — E860 Dehydration: Secondary | ICD-10-CM | POA: Diagnosis not present

## 2018-06-03 LAB — CBC WITH DIFFERENTIAL/PLATELET
Abs Immature Granulocytes: 0.05 10*3/uL (ref 0.00–0.07)
Basophils Absolute: 0.1 10*3/uL (ref 0.0–0.1)
Basophils Relative: 1 %
EOS ABS: 0.1 10*3/uL (ref 0.0–0.5)
EOS PCT: 1 %
HEMATOCRIT: 42.5 % (ref 36.0–46.0)
HEMOGLOBIN: 13.6 g/dL (ref 12.0–15.0)
Immature Granulocytes: 1 %
LYMPHS ABS: 1.9 10*3/uL (ref 0.7–4.0)
LYMPHS PCT: 18 %
MCH: 27.6 pg (ref 26.0–34.0)
MCHC: 32 g/dL (ref 30.0–36.0)
MCV: 86.2 fL (ref 80.0–100.0)
Monocytes Absolute: 0.8 10*3/uL (ref 0.1–1.0)
Monocytes Relative: 7 %
NRBC: 0 % (ref 0.0–0.2)
Neutro Abs: 7.8 10*3/uL — ABNORMAL HIGH (ref 1.7–7.7)
Neutrophils Relative %: 72 %
Platelets: 287 10*3/uL (ref 150–400)
RBC: 4.93 MIL/uL (ref 3.87–5.11)
RDW: 13.5 % (ref 11.5–15.5)
WBC: 10.7 10*3/uL — AB (ref 4.0–10.5)

## 2018-06-03 LAB — URINALYSIS, ROUTINE W REFLEX MICROSCOPIC
BILIRUBIN URINE: NEGATIVE
Glucose, UA: NEGATIVE mg/dL
HGB URINE DIPSTICK: NEGATIVE
Ketones, ur: NEGATIVE mg/dL
Leukocytes, UA: NEGATIVE
Nitrite: NEGATIVE
PH: 6 (ref 5.0–8.0)
Protein, ur: NEGATIVE mg/dL
SPECIFIC GRAVITY, URINE: 1.01 (ref 1.005–1.030)

## 2018-06-03 LAB — COMPREHENSIVE METABOLIC PANEL
ALK PHOS: 71 U/L (ref 38–126)
ALT: 25 U/L (ref 0–44)
AST: 22 U/L (ref 15–41)
Albumin: 4.1 g/dL (ref 3.5–5.0)
Anion gap: 9 (ref 5–15)
BUN: 39 mg/dL — AB (ref 8–23)
CALCIUM: 9.5 mg/dL (ref 8.9–10.3)
CHLORIDE: 100 mmol/L (ref 98–111)
CO2: 27 mmol/L (ref 22–32)
CREATININE: 1.51 mg/dL — AB (ref 0.44–1.00)
GFR, EST AFRICAN AMERICAN: 39 mL/min — AB (ref >60)
GFR, EST NON AFRICAN AMERICAN: 34 mL/min — AB (ref >60)
Glucose, Bld: 91 mg/dL (ref 70–99)
Potassium: 3.4 mmol/L — ABNORMAL LOW (ref 3.5–5.1)
Sodium: 136 mmol/L (ref 135–145)
Total Bilirubin: 1.9 mg/dL — ABNORMAL HIGH (ref 0.3–1.2)
Total Protein: 7.2 g/dL (ref 6.5–8.1)

## 2018-06-03 MED ORDER — SODIUM CHLORIDE 0.9 % IV BOLUS
1000.0000 mL | Freq: Once | INTRAVENOUS | Status: AC
  Administered 2018-06-03: 1000 mL via INTRAVENOUS

## 2018-06-03 MED ORDER — DOXYCYCLINE HYCLATE 100 MG PO CAPS: 100.0000 mg | ORAL_CAPSULE | Freq: Two times a day (BID) | ORAL | 0 refills | Status: DC

## 2018-06-03 MED ORDER — ONDANSETRON 4 MG PO TBDP: 4.0000 mg | ORAL_TABLET | Freq: Three times a day (TID) | ORAL | 0 refills | Status: DC | PRN

## 2018-06-03 MED ORDER — PREDNISONE 10 MG (21) PO TBPK: ORAL_TABLET | ORAL | 0 refills | Status: DC

## 2018-06-03 MED ORDER — ONDANSETRON HCL 4 MG/2ML IJ SOLN
4.0000 mg | Freq: Once | INTRAMUSCULAR | Status: AC
  Administered 2018-06-03: 4 mg via INTRAVENOUS
  Filled 2018-06-03: qty 2

## 2018-06-03 MED ORDER — MECLIZINE HCL 25 MG PO TABS: 25.0000 mg | ORAL_TABLET | Freq: Three times a day (TID) | ORAL | 0 refills | Status: AC | PRN

## 2018-06-03 MED ORDER — DEXAMETHASONE SODIUM PHOSPHATE 10 MG/ML IJ SOLN
10.0000 mg | Freq: Once | INTRAMUSCULAR | Status: AC
  Administered 2018-06-03: 10 mg via INTRAVENOUS
  Filled 2018-06-03: qty 1

## 2018-06-03 MED ORDER — DOXYCYCLINE HYCLATE 100 MG PO TABS
100.0000 mg | ORAL_TABLET | Freq: Once | ORAL | Status: AC
  Administered 2018-06-03: 100 mg via ORAL
  Filled 2018-06-03: qty 1

## 2018-06-03 MED ORDER — MECLIZINE HCL 25 MG PO TABS
25.0000 mg | ORAL_TABLET | Freq: Once | ORAL | Status: AC
  Administered 2018-06-03: 25 mg via ORAL
  Filled 2018-06-03: qty 1

## 2018-06-03 NOTE — ED Notes (Signed)
ED Provider at bedside. 

## 2018-06-03 NOTE — ED Notes (Signed)
Patient transported to X-ray 

## 2018-06-03 NOTE — ED Triage Notes (Signed)
Patient states that she has had nasal congestion x 2 weeks - reports that she has become so weak and dizzy that if she moves she is unable to tolerate the dizziness and the weakness

## 2018-06-03 NOTE — ED Provider Notes (Signed)
[facility] Provider Note   CSN: [id number] Arrival date & time: 06/03/18  1837     History   Chief Complaint Chief Complaint  Patient presents with  . Dizziness    HPI Elizabeth Walker is a 72 y.o. female.  Pt presents to the ED today with nasal congestion, weakness, and dizziness.  The pt said she thinks she may have a sinus infection because her sinuses are very tender.  Pt has had periodic vertigo for years and feels vertigo today.  She has been more sob than normal and is coughing up a lot of green sputum in the morning.  She feels very weak.  She has been nauseous when she eats.  She and her husband were going to go to a concert tonight, but she felt so sob and dizzy, she felt like she had to come here instead.  No f/c.  No vomiting/diarrhea.     Past Medical History:  Diagnosis Date  . Anxiety   . Arthritis   . Depression   . Esophageal spasm    a. pt reports prior GI study demonstrating this.  Marland Kitchen GERD (gastroesophageal reflux disease)   . Hypercholesteremia   . Hypertension   . Hypothyroid   . Migraine   . Osteoporosis   . Pneumonia   . Sinusitis   . Sleep apnea    a. intolerant to CPAP.  Marland Kitchen Tachycardia    a. suspected due to sinus tach. Event monitor/echo unremarkable.    Patient Active Problem List   Diagnosis Date Noted  . Chronic insomnia 08/29/2017  . 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  . Sleep apnea treated with nocturnal BiPAP 08/29/2017  . Excessive daytime sleepiness 08/29/2017  . CPAP ventilation treatment not tolerated 08/29/2017  . Sinusitis, chronic 01/02/2015  . Cough variant asthma 06/18/2014  . Dyspnea on exertion 05/13/2014  . Hypercholesteremia   . Atypical chest pain 04/08/2014  . Asthma with acute exacerbation 04/03/2014  . PNA (pneumonia) 04/02/2014  . Hypokalemia 04/02/2014  . CAP (community acquired pneumonia) 04/02/2014  . Hypothyroidism 04/10/2013  . Tachycardia 04/22/2011  . HYPERLIPIDEMIA  12/04/2007  . Essential hypertension 12/04/2007  . ALLERGIC RHINITIS 12/04/2007  . ASTHMA 12/04/2007  . HEADACHE, CHRONIC 12/04/2007  . Cough 12/04/2007    Past Surgical History:  Procedure Laterality Date  . cataracts  2016  . CERVICAL SPINE SURGERY    . [place]  . COMBINED AUGMENTATION MAMMAPLASTY AND ABDOMINOPLASTY    . HEMORROIDECTOMY  2009     OB History   No obstetric history on file.      Home Medications    Prior to Admission medications   Medication Sig Start Date End Date Taking? Authorizing Provider  acetaminophen (TYLENOL) 500 MG tablet Take 500 mg by mouth every 6 (six) hours as needed.    [provider]  atorvastatin (LIPITOR) 40 MG tablet TAKE ONE TABLET BY MOUTH ONE TIME DAILY  04/09/14   Elayne Snare, MD  Azelastine-Fluticasone 137-50 MCG/ACT SUSP Place 1 spray into the nose 2 (two) times daily. 12/27/14   Tanda Rockers, MD  BYSTOLIC 5 MG tablet TAKE 1 TABLET EVERY DAY 04/28/15   Tanda Rockers, MD  doxycycline (VIBRAMYCIN) 100 MG capsule Take 1 capsule (100 mg total) by mouth 2 (two) times daily. 06/03/18   Isla Pence, MD  ergocalciferol (VITAMIN D2) 50000 UNITS capsule Take 50,000 Units by mouth every Saturday.     [provider]  irbesartan (  AVAPRO) 300 MG tablet Take 300 mg by mouth daily.    [provider]  levothyroxine (SYNTHROID, LEVOTHROID) 100 MCG tablet TAKE 1 TABLET BY MOUTH ONCE DAILY 07/15/17   Elayne Snare, MD  LORazepam (ATIVAN) 0.5 MG tablet Take 0.5 mg by mouth 2 (two) times daily as needed.     [provider]  meclizine (ANTIVERT) 25 MG tablet Take 1 tablet (25 mg total) by mouth 3 (three) times daily as needed for dizziness. 06/03/18   Isla Pence, MD  nitroGLYCERIN (NITROSTAT) 0.4 MG SL tablet Place 1 tablet (0.4 mg total) under the tongue every 5 (five) minutes as needed for chest pain. 04/08/14   Janece Canterbury, MD  ondansetron (ZOFRAN ODT) 4 MG disintegrating tablet Take 1  tablet (4 mg total) by mouth every 8 (eight) hours as needed. 06/03/18   Isla Pence, MD  predniSONE (STERAPRED UNI-PAK 21 TAB) 10 MG (21) TBPK tablet Take 6 tabs for 2 days, then 5 for 2 days, then 4 for 2 days, then 3 for 2 days, 2 for 2 days, then 1 for 2 days 06/03/18   Isla Pence, MD  Probiotic Product (PROBIOTIC DAILY) CAPS Take 1 capsule by mouth daily.    [provider]  Respiratory Therapy Supplies (FLUTTER) DEVI Use as directed 01/02/15   Tanda Rockers, MD    Family History Family History  Problem Relation Age of Onset  . Coronary artery disease Unknown   . CAD Father        died at 55 - massive heart attack  . Emphysema Father        smoked  . Asthma Father   . Heart disease Father   . Heart disease Sister        had open heart surgery age 2  . Diabetes Sister   . Asthma Brother   . CAD Brother        had open heart surgery at 60  . Kidney failure Brother   . Stroke Sister   . Sjogren's syndrome Mother     Social History Social History   Tobacco Use  . Smoking status: Never Smoker  . Smokeless tobacco: Never Used  Substance Use Topics  . Alcohol use: Yes    Comment: rare wine  . Drug use: No     Allergies   Codeine and Pollen extract   Review of Systems Review of Systems  HENT: Positive for congestion and sinus pain.   Respiratory: Positive for cough.   Neurological: Positive for dizziness.  All other systems reviewed and are negative.    Physical Exam Updated Vital Signs BP (!) 111/47   Pulse (!) 54   Temp 98.3 F (36.8 C) (Oral)   Resp 12   Ht 5' (1.524 m)   Wt 52.2 kg   SpO2 100%   BMI 22.46 kg/m   Physical Exam Vitals signs and nursing note reviewed.  Constitutional:      Appearance: Normal appearance. She is normal weight.  HENT:     Head: Normocephalic and atraumatic.     Right Ear: Tympanic membrane, ear canal and external ear normal.     Left Ear: Tympanic membrane, ear canal and external ear normal.      Nose: Congestion present.     Right Sinus: Maxillary sinus tenderness present.     Left Sinus: Maxillary sinus tenderness present.     Mouth/Throat:     Mouth: Mucous membranes are moist.  Eyes:  Extraocular Movements: Extraocular movements intact.     Conjunctiva/sclera: Conjunctivae normal.     Pupils: Pupils are equal, round, and reactive to light.  Neck:     Musculoskeletal: Normal range of motion and neck supple.  Cardiovascular:     Rate and Rhythm: Normal rate and regular rhythm.     Pulses: Normal pulses.     Heart sounds: Normal heart sounds.  Pulmonary:     Effort: Pulmonary effort is normal.     Breath sounds: Normal breath sounds.  Abdominal:     General: Abdomen is flat.  Musculoskeletal: Normal range of motion.  Skin:    General: Skin is warm and dry.     Capillary Refill: Capillary refill takes less than 2 seconds.  Neurological:     General: No focal deficit present.     Mental Status: She is alert. Mental status is at baseline. She is disoriented.  Psychiatric:        Mood and Affect: Mood normal.      ED Treatments / Results  Labs (all labs ordered are listed, but only abnormal results are displayed) Labs Reviewed  COMPREHENSIVE METABOLIC PANEL - Abnormal; Notable for the following components:      Result Value   Potassium 3.4 (*)    BUN 39 (*)    Creatinine, Ser 1.51 (*)    Total Bilirubin 1.9 (*)    GFR calc non Af Amer 34 (*)    GFR calc Af Amer 39 (*)    All other components within normal limits  CBC WITH DIFFERENTIAL/PLATELET - Abnormal; Notable for the following components:   WBC 10.7 (*)    Neutro Abs 7.8 (*)    All other components within normal limits  URINALYSIS, ROUTINE W REFLEX MICROSCOPIC    EKG EKG Interpretation  Date/Time:  Saturday June 03 2018 19:06:57 EST Ventricular Rate:  57 PR Interval:    QRS Duration: 96 QT Interval:  442 QTC Calculation: 431 R Axis:   70 Text Interpretation:  Sinus rhythm Minimal ST  depression, inferior leads No significant change since last tracing Confirmed by Isla Pence [phone]) on 06/03/2018 7:13:16 PM   Radiology Dg Chest 2 View  Result Date: 06/03/2018 CLINICAL DATA:  Cough EXAM: CHEST - 2 VIEW COMPARISON:  12/17/2014 FINDINGS: Calcified breast implants. No focal opacity or pleural effusion. Normal heart size. Aortic atherosclerosis. No pneumothorax. Postsurgical changes of the cervical spine. IMPRESSION: No active cardiopulmonary disease. Electronically Signed   By: Donavan Foil M.D.   On: 06/03/2018 19:56    Procedures Procedures (including critical care time)  Medications Ordered in ED Medications  sodium chloride 0.9 % bolus 1,000 mL (1,000 mLs Intravenous New Bag/Given 06/03/18 1958)  dexamethasone (DECADRON) injection 10 mg (has no administration in time range)  ondansetron (ZOFRAN) injection 4 mg (4 mg Intravenous Given 06/03/18 1939)  doxycycline (VIBRA-TABS) tablet 100 mg (100 mg Oral Given 06/03/18 1939)  meclizine (ANTIVERT) tablet 25 mg (25 mg Oral Given 06/03/18 1939)     Initial Impression / Assessment and Plan / ED Course  I have reviewed the triage vital signs and the nursing notes.  Pertinent labs & imaging results that were available during my care of the patient were reviewed by me and considered in my medical decision making (see chart for details).    Pt's dizziness and nausea has improved.   Bun/Cr up since last labs in 2015.  Dehydration likely, but this will need to be rechecked.   She will be  started on doxy for sinusitis/bronchitis.  She said prednisone has helped her in the past, so she will also be started on that.  She will be given antivert for the dizziness and zofran for the nausea.  She knows to return if worse and to f/u with pcp.  Final Clinical Impressions(s) / ED Diagnoses   Final diagnoses:  Acute non-recurrent maxillary sinusitis  Peripheral vertigo, unspecified laterality  Dehydration    ED Discharge  Orders         Ordered    doxycycline (VIBRAMYCIN) 100 MG capsule  2 times daily     06/03/18 2055    predniSONE (STERAPRED UNI-PAK 21 TAB) 10 MG (21) TBPK tablet     06/03/18 2055    meclizine (ANTIVERT) 25 MG tablet  3 times daily PRN     06/03/18 2055    ondansetron (ZOFRAN ODT) 4 MG disintegrating tablet  Every 8 hours PRN     06/03/18 2055           Isla Pence, MD 06/03/18 [phone]

## 2018-06-15 ENCOUNTER — Telehealth: Payer: Self-pay | Admitting: Neurology

## 2018-06-15 NOTE — Telephone Encounter (Signed)
Pt is confused, she is wanting to know why she needs to see a psychiatrist?? Please call to advise at [phone]

## 2018-06-15 NOTE — Telephone Encounter (Signed)
Called the patient back and informed her that based off the last office visit Dr Brett Fairy mentioned the patient receive vestibular rehab/ Physical therapy for vertigo and syncope spells. She also mentioned the patient see cognitive behavior therapy for insomnia treatment. The patient states that insomnia wasn't of concern until she started CPAP therapy and that interupted her sleep. Patient wasn't too keen on the idea of going to [facility] as she didn't read reviews on the place to be positive. She asked who we recommended for cognitive behavior therapy. Advised the patient I would send her questions to our referral specialist to assess if there may be someone more suitable to help.

## 2018-06-15 NOTE — Telephone Encounter (Signed)
Called and spoke to and relayed [facility] has been trying to call her to sch. Called and gave her telephone number to registration dept. [phone]. Patient stated she would call.

## 2018-06-19 NOTE — Telephone Encounter (Signed)
Spoke to Brittany

## 2018-06-19 NOTE — Telephone Encounter (Signed)
Patient called and said she had not hurd back from [facility]. [phone] . Patient is ready to schedule . I called [facility]  And they arte going to call Patient to sch. Thanks Hinton Dyer.

## 2018-06-29 ENCOUNTER — Other Ambulatory Visit: Payer: Self-pay | Admitting: Otolaryngology

## 2018-07-13 ENCOUNTER — Encounter (HOSPITAL_BASED_OUTPATIENT_CLINIC_OR_DEPARTMENT_OTHER): Payer: Self-pay | Admitting: *Deleted

## 2018-07-14 ENCOUNTER — Other Ambulatory Visit: Payer: Self-pay

## 2018-07-14 ENCOUNTER — Encounter (HOSPITAL_BASED_OUTPATIENT_CLINIC_OR_DEPARTMENT_OTHER): Payer: Self-pay | Admitting: *Deleted

## 2018-07-17 ENCOUNTER — Encounter (HOSPITAL_BASED_OUTPATIENT_CLINIC_OR_DEPARTMENT_OTHER)
Admission: RE | Admit: 2018-07-17 | Discharge: 2018-07-17 | Disposition: A | Discharge status: Home / Self Care | Payer: Medicare Other | Source: Ambulatory Visit | Attending: Otolaryngology | Admitting: Otolaryngology

## 2018-07-17 DIAGNOSIS — Z01812 Encounter for preprocedural laboratory examination: Secondary | ICD-10-CM | POA: Diagnosis present

## 2018-07-17 LAB — BASIC METABOLIC PANEL
Anion gap: 12 (ref 5–15)
BUN: 25 mg/dL — ABNORMAL HIGH (ref 8–23)
CO2: 24 mmol/L (ref 22–32)
CREATININE: 1.08 mg/dL — AB (ref 0.44–1.00)
Calcium: 9.5 mg/dL (ref 8.9–10.3)
Chloride: 104 mmol/L (ref 98–111)
GFR calc Af Amer: 58 mL/min — ABNORMAL LOW (ref >60)
GFR calc non Af Amer: 50 mL/min — ABNORMAL LOW (ref >60)
Glucose, Bld: 98 mg/dL (ref 70–99)
Potassium: 3.7 mmol/L (ref 3.5–5.1)
Sodium: 140 mmol/L (ref 135–145)

## 2018-07-21 ENCOUNTER — Ambulatory Visit (HOSPITAL_BASED_OUTPATIENT_CLINIC_OR_DEPARTMENT_OTHER): Payer: Medicare Other | Admitting: Certified Registered"

## 2018-07-21 ENCOUNTER — Other Ambulatory Visit: Payer: Self-pay

## 2018-07-21 ENCOUNTER — Encounter (HOSPITAL_BASED_OUTPATIENT_CLINIC_OR_DEPARTMENT_OTHER): Payer: Self-pay | Admitting: *Deleted

## 2018-07-21 ENCOUNTER — Ambulatory Visit (HOSPITAL_BASED_OUTPATIENT_CLINIC_OR_DEPARTMENT_OTHER)
Admission: RE | Admit: 2018-07-21 | Discharge: 2018-07-21 | Disposition: A | Discharge status: Home / Self Care | Payer: Medicare Other | Attending: Otolaryngology | Admitting: Otolaryngology

## 2018-07-21 ENCOUNTER — Encounter (HOSPITAL_BASED_OUTPATIENT_CLINIC_OR_DEPARTMENT_OTHER)
Admission: RE | Disposition: A | Discharge status: Home / Self Care | Payer: Self-pay | Source: Home / Self Care | Attending: Otolaryngology

## 2018-07-21 DIAGNOSIS — Z825 Family history of asthma and other chronic lower respiratory diseases: Secondary | ICD-10-CM | POA: Diagnosis not present

## 2018-07-21 DIAGNOSIS — G43909 Migraine, unspecified, not intractable, without status migrainosus: Secondary | ICD-10-CM | POA: Insufficient documentation

## 2018-07-21 DIAGNOSIS — Z79899 Other long term (current) drug therapy: Secondary | ICD-10-CM | POA: Diagnosis not present

## 2018-07-21 DIAGNOSIS — K219 Gastro-esophageal reflux disease without esophagitis: Secondary | ICD-10-CM | POA: Diagnosis not present

## 2018-07-21 DIAGNOSIS — M810 Age-related osteoporosis without current pathological fracture: Secondary | ICD-10-CM | POA: Insufficient documentation

## 2018-07-21 DIAGNOSIS — G4733 Obstructive sleep apnea (adult) (pediatric): Secondary | ICD-10-CM | POA: Insufficient documentation

## 2018-07-21 DIAGNOSIS — Z885 Allergy status to narcotic agent status: Secondary | ICD-10-CM | POA: Diagnosis not present

## 2018-07-21 DIAGNOSIS — F419 Anxiety disorder, unspecified: Secondary | ICD-10-CM | POA: Insufficient documentation

## 2018-07-21 DIAGNOSIS — E7800 Pure hypercholesterolemia, unspecified: Secondary | ICD-10-CM | POA: Insufficient documentation

## 2018-07-21 DIAGNOSIS — E039 Hypothyroidism, unspecified: Secondary | ICD-10-CM | POA: Diagnosis not present

## 2018-07-21 DIAGNOSIS — Z823 Family history of stroke: Secondary | ICD-10-CM | POA: Insufficient documentation

## 2018-07-21 DIAGNOSIS — Z8489 Family history of other specified conditions: Secondary | ICD-10-CM | POA: Diagnosis not present

## 2018-07-21 DIAGNOSIS — Z9109 Other allergy status, other than to drugs and biological substances: Secondary | ICD-10-CM | POA: Insufficient documentation

## 2018-07-21 DIAGNOSIS — K224 Dyskinesia of esophagus: Secondary | ICD-10-CM | POA: Insufficient documentation

## 2018-07-21 DIAGNOSIS — Z841 Family history of disorders of kidney and ureter: Secondary | ICD-10-CM | POA: Insufficient documentation

## 2018-07-21 DIAGNOSIS — R000 Tachycardia, unspecified: Secondary | ICD-10-CM | POA: Insufficient documentation

## 2018-07-21 DIAGNOSIS — Z833 Family history of diabetes mellitus: Secondary | ICD-10-CM | POA: Diagnosis not present

## 2018-07-21 DIAGNOSIS — F329 Major depressive disorder, single episode, unspecified: Secondary | ICD-10-CM | POA: Insufficient documentation

## 2018-07-21 DIAGNOSIS — I10 Essential (primary) hypertension: Secondary | ICD-10-CM | POA: Diagnosis not present

## 2018-07-21 DIAGNOSIS — M1990 Unspecified osteoarthritis, unspecified site: Secondary | ICD-10-CM | POA: Diagnosis not present

## 2018-07-21 DIAGNOSIS — Z8249 Family history of ischemic heart disease and other diseases of the circulatory system: Secondary | ICD-10-CM | POA: Insufficient documentation

## 2018-07-21 HISTORY — PX: DRUG INDUCED ENDOSCOPY: SHX6808

## 2018-07-21 SURGERY — DRUG INDUCED SLEEP ENDOSCOPY
Anesthesia: Monitor Anesthesia Care | Site: Nose

## 2018-07-21 MED ORDER — FENTANYL CITRATE (PF) 100 MCG/2ML IJ SOLN: 50.0000 ug | INTRAMUSCULAR | Status: DC | PRN

## 2018-07-21 MED ORDER — SCOPOLAMINE 1 MG/3DAYS TD PT72: 1.0000 | MEDICATED_PATCH | Freq: Once | TRANSDERMAL | Status: DC | PRN

## 2018-07-21 MED ORDER — CHLORHEXIDINE GLUCONATE CLOTH 2 % EX PADS: 6.0000 | MEDICATED_PAD | Freq: Once | CUTANEOUS | Status: DC

## 2018-07-21 MED ORDER — MIDAZOLAM HCL 2 MG/2ML IJ SOLN: 1.0000 mg | INTRAMUSCULAR | Status: DC | PRN

## 2018-07-21 MED ORDER — PROPOFOL 10 MG/ML IV BOLUS
INTRAVENOUS | Status: DC | PRN
  Administered 2018-07-21 (×2): 10 mg via INTRAVENOUS

## 2018-07-21 MED ORDER — LACTATED RINGERS IV SOLN
INTRAVENOUS | Status: DC
  Administered 2018-07-21: 08:00:00 via INTRAVENOUS

## 2018-07-21 MED ORDER — OXYMETAZOLINE HCL 0.05 % NA SOLN
NASAL | Status: AC
  Filled 2018-07-21: qty 30

## 2018-07-21 MED ORDER — LIDOCAINE 2% (20 MG/ML) 5 ML SYRINGE
INTRAMUSCULAR | Status: DC | PRN
  Administered 2018-07-21: 60 mg via INTRAVENOUS

## 2018-07-21 MED ORDER — PROPOFOL 500 MG/50ML IV EMUL
INTRAVENOUS | Status: DC | PRN
  Administered 2018-07-21: 35 ug/kg/min via INTRAVENOUS

## 2018-07-21 SURGICAL SUPPLY — 13 items
CANISTER SUCT 1200ML W/VALVE (MISCELLANEOUS) ×2 IMPLANT
COVER WAND RF STERILE (DRAPES) IMPLANT
GLOVE BIOGEL M 7.0 STRL (GLOVE) ×2 IMPLANT
NDL PRECISIONGLIDE 27X1.5 (NEEDLE) IMPLANT
NEEDLE PRECISIONGLIDE 27X1.5 (NEEDLE) IMPLANT
PACK BASIN DAY SURGERY FS (CUSTOM PROCEDURE TRAY) ×2 IMPLANT
PATTIES SURGICAL .5 X3 (DISPOSABLE) IMPLANT
SHEET MEDIUM DRAPE 40X70 STRL (DRAPES) ×2 IMPLANT
SOLUTION ANTI FOG 6CC (MISCELLANEOUS) ×2 IMPLANT
SPONGE NEURO XRAY DETECT 1X3 (DISPOSABLE) IMPLANT
SYR CONTROL 10ML LL (SYRINGE) IMPLANT
TOWEL GREEN STERILE FF (TOWEL DISPOSABLE) ×2 IMPLANT
TUBE CONNECTING 20X1/4 (TUBING) ×2 IMPLANT

## 2018-07-21 NOTE — Anesthesia Postprocedure Evaluation (Signed)
Anesthesia Post Note  Patient: Elizabeth Walker  Procedure(s) Performed: DRUG INDUCED ENDOSCOPY (N/A Nose)     Patient location during evaluation: PACU Anesthesia Type: General Level of consciousness: awake and alert Pain management: pain level controlled Vital Signs Assessment: post-procedure vital signs reviewed and stable Respiratory status: spontaneous breathing, nonlabored ventilation, respiratory function stable and patient connected to nasal cannula oxygen Cardiovascular status: stable and blood pressure returned to baseline Postop Assessment: no apparent nausea or vomiting Anesthetic complications: no    Last Vitals:  Vitals:   07/21/18 0915 07/21/18 0930  BP: 111/60 (!) 142/77  Pulse: 65 67  Resp: 16 16  Temp:  36.7 C  SpO2: 100% 97%    Last Pain:  Vitals:   07/21/18 0930  TempSrc:   PainSc: 0-No pain                 Braelee Herrle DAVID

## 2018-07-21 NOTE — Transfer of Care (Signed)
Immediate Anesthesia Transfer of Care Note  Patient: RAYAH FINES  Procedure(s) Performed: DRUG INDUCED ENDOSCOPY (N/A Nose)  Patient Location: PACU  Anesthesia Type:MAC  Level of Consciousness: awake, alert , oriented and patient cooperative  Airway & Oxygen Therapy: Patient Spontanous Breathing and Patient connected to face mask oxygen  Post-op Assessment: Report given to RN and Post -op Vital signs reviewed and stable  Post vital signs: Reviewed and stable  Last Vitals:  Vitals Value Taken Time  BP    Temp    Pulse 69 07/21/2018  9:00 AM  Resp 19 07/21/2018  9:00 AM  SpO2 100 % 07/21/2018  9:00 AM  Vitals shown include unvalidated device data.  Last Pain:  Vitals:   07/21/18 0754  TempSrc: Oral  PainSc: 8       Patients Stated Pain Goal: 5 (80/32/12 2482)  Complications: No apparent anesthesia complications

## 2018-07-21 NOTE — Anesthesia Procedure Notes (Signed)
Procedure Name: MAC Date/Time: 07/21/2018 9:02 AM Performed by: Signe Colt, CRNA Pre-anesthesia Checklist: Patient identified, Emergency Drugs available, Suction available, Patient being monitored and Timeout performed Patient Re-evaluated:Patient Re-evaluated prior to induction

## 2018-07-21 NOTE — Op Note (Signed)
Operative Note: DRUG INDUCED SLEEP ENDOSCOPY  Patient: [place] record number: [id number]  Date:07/21/2018  Pre-operative Indications: 1.  Obstructive Sleep Apnea  Postoperative Indications: Same  Surgical Procedure: 1.  Drug Induced Sleep Endoscopy (DISE)  Anesthesia: MAC with IV sedation  Surgeon: Delsa Bern, M.D.  Complications: None  EBL: None  Findings:  VOTE Score   AP Lateral  Concentric  VP: 100%     OP: None     TB: 100%     Epi: None        Brief History: The patient is a 73 y.o. female with a history of obstructive sleep apnea. The patient has undergone previous sleep study which showed mild to moderate levels of obstructive sleep apnea.  The patient was prescribed CPAP which they consistently attempted to use without success.  Given the patient's history and findings, the above drug-induced sleep endoscopy was recommended to assess the patient's anatomic level of apnea.  Risks and benefits were discussed in detail with the patient today understand and agree with our plan for surgery which is scheduled at [facility] under sedated anesthesia as an outpatient.  Surgical Procedure: The patient is brought to the operating room on 07/21/2018 and placed in supine position on the operating table.  Intravenous sedated anesthesia was established without difficulty using the standard drug-induced sleep endoscopy protocol. When the patient was adequately anesthetized, surgical timeout was performed and correct identification of the patient and the surgical procedure.  The patient's nose was then packed with Afrin-soaked cottonoid pledgets were left in place for approximately 10 minutes lateral vasoconstriction and hemostasis.   A propofol infusion was administered and the patient was monitored carefully to achieve a level of sedation appropriate for DISE.  The patient did not respond to verbal commands but still had spontaneous respiration, sleep  disordered breathing and associated desaturations were observed.  With the patient under adequate sedated anesthesia the flexible nasal laryngoscope was passed without difficulty.  The patient's nasal cavity showed septal deviation, no mass, polyp or discharge.  The endoscope was then passed to visualize the VeloPharynx, OroPharynx, Tongue Base and Epiglottis to assess areas of obstruction.  The patient's VOTE score as outlined above.  There was no evidence of complete concentric palatal obstruction and the patient appeared to be a candidate anatomically for hypoglossal nerve stimulation therapy.  Surgical sponge count was correct. Patient was awakened from anesthetic and transferred from the operating room to the recovery room in stable condition. There were no complications and no blood loss.   Delsa Bern, M.D. [facility] 07/21/2018

## 2018-07-21 NOTE — Anesthesia Preprocedure Evaluation (Signed)
Anesthesia Evaluation  Patient identified by MRN, date of birth, ID band Patient awake    Reviewed: Allergy & Precautions, NPO status , Patient's Chart, lab work & pertinent test results  Airway Mallampati: I  TM Distance: >3 FB Neck ROM: Full    Dental   Pulmonary sleep apnea ,    Pulmonary exam normal        Cardiovascular hypertension, Pt. on medications Normal cardiovascular exam     Neuro/Psych Anxiety Depression    GI/Hepatic GERD  Medicated and Controlled,  Endo/Other    Renal/GU      Musculoskeletal   Abdominal   Peds  Hematology   Anesthesia Other Findings   Reproductive/Obstetrics                             Anesthesia Physical Anesthesia Plan  ASA: III  Anesthesia Plan: General   Post-op Pain Management:    Induction: Intravenous  PONV Risk Score and Plan: 3 and Treatment may vary due to age or medical condition  Airway Management Planned: Simple Face Mask  Additional Equipment:   Intra-op Plan:   Post-operative Plan:   Informed Consent: I have reviewed the patients History and Physical, chart, labs and discussed the procedure including the risks, benefits and alternatives for the proposed anesthesia with the patient or authorized representative who has indicated his/her understanding and acceptance.       Plan Discussed with: CRNA and Surgeon  Anesthesia Plan Comments:         Anesthesia Quick Evaluation

## 2018-07-21 NOTE — Discharge Instructions (Signed)

## 2018-07-21 NOTE — H&P (Signed)
Elizabeth Walker is an 73 y.o. female.   Chief Complaint: Obstructive sleep apnea HPI: Patient has a history of moderately severe obstructive sleep apnea and is unable to tolerate CPAP on a consistent basis.  Past Medical History:  Diagnosis Date  . Anxiety   . Arthritis   . Depression   . Esophageal spasm    a. pt reports prior GI study demonstrating this.  Marland Kitchen GERD (gastroesophageal reflux disease)   . Hypercholesteremia   . Hypertension   . Hypothyroid   . Migraine   . Osteoporosis   . Pneumonia   . Sinusitis   . Sleep apnea    a. intolerant to CPAP.  Marland Kitchen Tachycardia    a. suspected due to sinus tach. Event monitor/echo unremarkable.    Past Surgical History:  Procedure Laterality Date  . cataracts  2016  . CERVICAL SPINE SURGERY    . [place]  . COMBINED AUGMENTATION MAMMAPLASTY AND ABDOMINOPLASTY    . HEMORROIDECTOMY  2009    Family History  Problem Relation Age of Onset  . Coronary artery disease Other   . CAD Father        died at 68 - massive heart attack  . Emphysema Father        smoked  . Asthma Father   . Heart disease Father   . Heart disease Sister        had open heart surgery age 31  . Diabetes Sister   . Asthma Brother   . CAD Brother        had open heart surgery at 52  . Kidney failure Brother   . Stroke Sister   . Sjogren's syndrome Mother    Social History:  reports that she has never smoked. She has never used smokeless tobacco. She reports current alcohol use. She reports that she does not use drugs.  Allergies:  Allergies  Allergen Reactions  . Codeine Nausea And Vomiting  . Pollen Extract Other (See Comments)    Stuffy nose    Medications Prior to Admission  Medication Sig Dispense Refill  . acetaminophen (TYLENOL) 500 MG tablet Take 500 mg by mouth every 6 (six) hours as needed.    Marland Kitchen amoxicillin (AMOXIL) 875 MG tablet Take 875 mg by mouth 2 (two) times daily.    Marland Kitchen atorvastatin (LIPITOR) 40 MG tablet TAKE ONE TABLET BY  MOUTH ONE TIME DAILY  90 tablet 0  . Azelastine-Fluticasone 137-50 MCG/ACT SUSP Place 1 spray into the nose 2 (two) times daily. 23 g 6  . BYSTOLIC 5 MG tablet TAKE 1 TABLET EVERY DAY 30 tablet 0  . chlorthalidone (HYGROTON) 25 MG tablet Take 25 mg by mouth daily.    . cyclobenzaprine (FLEXERIL) 5 MG tablet Take 5 mg by mouth 3 (three) times daily as needed for muscle spasms.    . ergocalciferol (VITAMIN D2) 50000 UNITS capsule Take 50,000 Units by mouth every Saturday.     . irbesartan (AVAPRO) 300 MG tablet Take 300 mg by mouth daily.    Marland Kitchen levothyroxine (SYNTHROID, LEVOTHROID) 100 MCG tablet TAKE 1 TABLET BY MOUTH ONCE DAILY 30 tablet 0  . LORazepam (ATIVAN) 0.5 MG tablet Take 0.5 mg by mouth 2 (two) times daily as needed.     . meclizine (ANTIVERT) 25 MG tablet Take 1 tablet (25 mg total) by mouth 3 (three) times daily as needed for dizziness. 30 tablet 0  . ondansetron (ZOFRAN ODT) 4 MG disintegrating tablet Take 1 tablet (  4 mg total) by mouth every 8 (eight) hours as needed. 10 tablet 0  . Probiotic Product (PROBIOTIC DAILY) CAPS Take 1 capsule by mouth daily.    Marland Kitchen albuterol (PROVENTIL HFA;VENTOLIN HFA) 108 (90 Base) MCG/ACT inhaler Inhale into the lungs every 6 (six) hours as needed for wheezing or shortness of breath.    . budesonide-formoterol (SYMBICORT) 160-4.5 MCG/ACT inhaler Inhale 2 puffs into the lungs 2 (two) times daily.    Marland Kitchen doxycycline (VIBRAMYCIN) 100 MG capsule Take 1 capsule (100 mg total) by mouth 2 (two) times daily. 20 capsule 0  . nitroGLYCERIN (NITROSTAT) 0.4 MG SL tablet Place 1 tablet (0.4 mg total) under the tongue every 5 (five) minutes as needed for chest pain. 20 tablet 0  . predniSONE (STERAPRED UNI-PAK 21 TAB) 10 MG (21) TBPK tablet Take 6 tabs for 2 days, then 5 for 2 days, then 4 for 2 days, then 3 for 2 days, 2 for 2 days, then 1 for 2 days 42 tablet 0  . Respiratory Therapy Supplies (FLUTTER) DEVI Use as directed 1 each 0    No results found for this or any  previous visit (from the past 52 hour(s)). No results found.  Review of Systems  Constitutional: Negative.   HENT: Negative.   Respiratory: Negative.   Cardiovascular: Negative.     Blood pressure (!) 129/54, pulse (!) 57, temperature 97.7 F (36.5 C), temperature source Oral, resp. rate 16, height 5' (1.524 m), weight 53.9 kg, SpO2 100 %. Physical Exam  Constitutional: She appears well-developed and well-nourished.  HENT:  Nose: Nose normal.  Mouth/Throat: Oropharynx is clear and moist.  Neck: Normal range of motion. Neck supple.  Cardiovascular: Normal rate.  Respiratory: Effort normal.     Assessment/Plan Patient admitted for drug-induced sleep endoscopy (DISE) for assessment of airway obstruction.  Jerrell Belfast, MD 07/21/2018, 8:17 AM

## 2018-07-24 ENCOUNTER — Encounter (HOSPITAL_BASED_OUTPATIENT_CLINIC_OR_DEPARTMENT_OTHER): Payer: Self-pay | Admitting: Otolaryngology

## 2018-08-16 ENCOUNTER — Telehealth: Payer: Self-pay | Admitting: Neurology

## 2018-08-16 NOTE — Telephone Encounter (Signed)
[facility] has called re: pt wanting the Inspire devise but Butch Penny is asking for a call from [place] about a breakdown of Central and Mixed apnea.  Please call

## 2018-11-16 ENCOUNTER — Other Ambulatory Visit: Payer: Self-pay | Admitting: Otolaryngology

## 2018-12-13 ENCOUNTER — Ambulatory Visit: Admit: 2018-12-13 | Payer: Medicare Other | Admitting: Otolaryngology

## 2018-12-13 SURGERY — INSERTION, HYPOGLOSSAL NERVE STIMULATOR
Anesthesia: General

## 2019-02-28 ENCOUNTER — Other Ambulatory Visit: Payer: Self-pay

## 2019-02-28 ENCOUNTER — Encounter (HOSPITAL_COMMUNITY): Payer: Self-pay

## 2019-02-28 ENCOUNTER — Encounter (HOSPITAL_COMMUNITY)
Admission: RE | Admit: 2019-02-28 | Discharge: 2019-02-28 | Disposition: A | Discharge status: Home / Self Care | Payer: Medicare Other | Source: Ambulatory Visit | Attending: Otolaryngology | Admitting: Otolaryngology

## 2019-02-28 DIAGNOSIS — Z79899 Other long term (current) drug therapy: Secondary | ICD-10-CM | POA: Diagnosis not present

## 2019-02-28 DIAGNOSIS — K219 Gastro-esophageal reflux disease without esophagitis: Secondary | ICD-10-CM | POA: Insufficient documentation

## 2019-02-28 DIAGNOSIS — M810 Age-related osteoporosis without current pathological fracture: Secondary | ICD-10-CM | POA: Diagnosis not present

## 2019-02-28 DIAGNOSIS — E7800 Pure hypercholesterolemia, unspecified: Secondary | ICD-10-CM | POA: Diagnosis not present

## 2019-02-28 DIAGNOSIS — E039 Hypothyroidism, unspecified: Secondary | ICD-10-CM | POA: Insufficient documentation

## 2019-02-28 DIAGNOSIS — Z01812 Encounter for preprocedural laboratory examination: Secondary | ICD-10-CM | POA: Diagnosis present

## 2019-02-28 HISTORY — DX: Other specified postprocedural states: R11.2

## 2019-02-28 HISTORY — DX: Other specified postprocedural states: Z98.890

## 2019-02-28 LAB — CBC
HCT: 41.9 % (ref 36.0–46.0)
Hemoglobin: 13.6 g/dL (ref 12.0–15.0)
MCH: 28.2 pg (ref 26.0–34.0)
MCHC: 32.5 g/dL (ref 30.0–36.0)
MCV: 86.7 fL (ref 80.0–100.0)
Platelets: 259 10*3/uL (ref 150–400)
RBC: 4.83 MIL/uL (ref 3.87–5.11)
RDW: 13.1 % (ref 11.5–15.5)
WBC: 6.7 10*3/uL (ref 4.0–10.5)
nRBC: 0 % (ref 0.0–0.2)

## 2019-02-28 LAB — BASIC METABOLIC PANEL
Anion gap: 11 (ref 5–15)
BUN: 26 mg/dL — ABNORMAL HIGH (ref 8–23)
CO2: 26 mmol/L (ref 22–32)
Calcium: 9.4 mg/dL (ref 8.9–10.3)
Chloride: 102 mmol/L (ref 98–111)
Creatinine, Ser: 0.99 mg/dL (ref 0.44–1.00)
GFR calc Af Amer: >60 mL/min (ref >60)
GFR calc non Af Amer: 56 mL/min — ABNORMAL LOW (ref >60)
Glucose, Bld: 99 mg/dL (ref 70–99)
Potassium: 3.4 mmol/L — ABNORMAL LOW (ref 3.5–5.1)
Sodium: 139 mmol/L (ref 135–145)

## 2019-02-28 NOTE — Pre-Procedure Instructions (Signed)
Elizabeth Walker  02/28/2019      [facility] [place], [place] [address] Phone: [phone] Fax: [phone]    Your procedure is scheduled on 03/09/19.  Report to [facility] Admitting at 530 A.M.  Call this number if you have problems the morning of surgery:  [phone]   Remember:  Do not eat or drink after midnight.     Take these medicines the morning of surgery with A SIP OF WATER --TYLENOL,ALL INHALERS,HYGROTON,SYNTHROID,ATIVAN,PRILOSEC    Do not wear jewelry, make-up or nail polish.  Do not wear lotions, powders, or perfumes, or deodorant.  Do not shave 48 hours prior to surgery.  Men may shave face and neck.  Do not bring valuables to the hospital.  [facility] is not responsible for any belongings or valuables.  Contacts, dentures or bridgework may not be worn into surgery.  Leave your suitcase in the car.  After surgery it may be brought to your room.  For patients admitted to the hospital, discharge time will be determined by your treatment team.  Patients discharged the day of surgery will not be allowed to drive home.  Do not take any aspirin,anti-inflammatories,vitamins,or herbal supplements 5-7 days prior to surgery.[place] - Preparing for Surgery  Before surgery, you can play an important role.  Because skin is not sterile, your skin needs to be as free of germs as possible.  You can reduce the number of germs on you skin by washing with CHG (chlorahexidine gluconate) soap before surgery.  CHG is an antiseptic cleaner which kills germs and bonds with the skin to continue killing germs even after washing.  Oral Hygiene is also important in reducing the risk of infection.  Remember to brush your teeth with your regular toothpaste the morning of surgery.  Please DO NOT use if you have an allergy to CHG or antibacterial soaps.  If your skin becomes reddened/irritated  stop using the CHG and inform your nurse when you arrive at Short Stay.  Do not shave (including legs and underarms) for at least 48 hours prior to the first CHG shower.  You may shave your face.  Please follow these instructions carefully:   1.  Shower with CHG Soap the night before surgery and the morning of Surgery.  2.  If you choose to wash your hair, wash your hair first as usual with your normal shampoo.  3.  After you shampoo, rinse your hair and body thoroughly to remove the shampoo. 4.  Use CHG as you would any other liquid soap.  You can apply chg directly to the skin and wash gently with a      scrungie or washcloth.           5.  Apply the CHG Soap to your body ONLY FROM THE NECK DOWN.   Do not use on open wounds or open sores. Avoid contact with your eyes, ears, mouth and genitals (private parts).  Wash genitals (private parts) with your normal soap.  6.  Wash thoroughly, paying special attention to the area where your surgery will be performed.  7.  Thoroughly rinse your body with warm water from the neck down.  8.  DO NOT shower/wash with your normal soap after using and rinsing off the CHG Soap.  9.  Pat yourself dry with a clean towel.  10.  Wear clean pajamas.            11.  Place clean sheets on your bed the night of your first shower and do not sleep with pets.  Day of Surgery  Do not apply any lotions/deoderants the morning of surgery.   Please wear clean clothes to the hospital/surgery center. Remember to brush your teeth with toothpaste.     Please read over the following fact sheets that you were given.

## 2019-02-28 NOTE — Progress Notes (Addendum)
PCP - Dr Abner Greenspan Cardiologist - na Chest x-ray - 12/19 EKG - 12/19 Stress Test - na ECHO 2/15-  Cardiac Cath - na  Sleep Study -yes  CPAP - yes    Anesthesia review: k 3.4  Patient denies shortness of breath, fever, cough and chest pain at PAT appointment   Patient verbalized understanding of instructions that were given to them at the PAT appointment. Patient was also instructed that they will need to review over the PAT instructions again at home before surgery.

## 2019-03-02 ENCOUNTER — Encounter (HOSPITAL_COMMUNITY): Payer: Self-pay

## 2019-03-02 NOTE — Anesthesia Preprocedure Evaluation (Addendum)
Anesthesia Evaluation  Patient identified by MRN, date of birth, ID band Patient awake    Reviewed: Allergy & Precautions, NPO status , Patient's Chart, lab work & pertinent test results  History of Anesthesia Complications (+) PONV  Airway Mallampati: II  TM Distance: >3 FB Neck ROM: Full    Dental no notable dental hx. (+) Teeth Intact, Dental Advisory Given   Pulmonary asthma , sleep apnea ,    Pulmonary exam normal breath sounds clear to auscultation       Cardiovascular hypertension, negative cardio ROS Normal cardiovascular exam Rhythm:Regular Rate:Normal  EKG 06/03/18: Sinus rhythm Minimal ST depression, inferior leads No significant change since last tracing (from 2015)  Stress Echo 11/04/15 [facility]): SUMMARY The patient had no chest pain during stress and achieved 90% of maximum predicted heart rate. Normal left ventricular function at rest. There was normal left ventricular wall motion at rest. No segmental wall motion abnormalities at rest. The estimated LV ejection fraction is 50-55%. Left ventricular diastolic function: Delayed relaxation. No segmental wall motion abnormalities with dobutamine. Normal augmentation of all left ventricular wall segments with dobutamine. The estimated LV ejection fraction is >70% with stress. Negative dobutamine echocardiography for inducible ischemia at target heart rate.  Nuclear stress test 04/29/14: Overall Impression: Normal stress nuclear study. LV Ejection Fraction: 86% (overestimated due to small heart size). LV Wall Motion: NL LV Function; NL Wall Motion  Echo 04/02/14:  - Vigorous LV function; grade 1 diastolic dysfunction; mild LAE; trace MR.    Neuro/Psych  Headaches, PSYCHIATRIC DISORDERS Anxiety Depression    GI/Hepatic Neg liver ROS, GERD  Medicated,  Endo/Other  Hypothyroidism   Renal/GU negative Renal ROS  negative genitourinary    Musculoskeletal  (+) Arthritis , C5-7 ACDF 12/23/15   Abdominal   Peds  Hematology negative hematology ROS (+)   Anesthesia Other Findings   Reproductive/Obstetrics                           Anesthesia Physical Anesthesia Plan  ASA: III  Anesthesia Plan: General   Post-op Pain Management:    Induction: Intravenous  PONV Risk Score and Plan: 4 or greater and Midazolam, Dexamethasone, Ondansetron and Treatment may vary due to age or medical condition  Airway Management Planned: Oral ETT  Additional Equipment:   Intra-op Plan:   Post-operative Plan: Extubation in OR  Informed Consent: I have reviewed the patients History and Physical, chart, labs and discussed the procedure including the risks, benefits and alternatives for the proposed anesthesia with the patient or authorized representative who has indicated his/her understanding and acceptance.     Dental advisory given  Plan Discussed with: CRNA  Anesthesia Plan Comments:        Anesthesia Quick Evaluation

## 2019-03-02 NOTE — Progress Notes (Signed)
Anesthesia Chart Review:  Case: [id number] Date/Time: 03/09/19 0715   Procedure: IMPLANTATION OF HYPOGLOSSAL NERVE STIMULATOR (N/A )   Anesthesia type: General   Pre-op diagnosis: OBSTRUCTIVE SLEEP APNEA   Location: [place] OR ROOM 09 / [place] OR   Surgeon: Jerrell Belfast, MD      DISCUSSION: Patient is a 73 year old female scheduled for the above procedure. S/p drug induced sleep endoscopy 07/21/18.  History includes never smoker, post-operative N/V, GERD, hypercholesterolemia, hypothyroidism, HTN, tachycardia, OSA (intolerant to CPAP), tachy-palpitations (2012), migraines, esophageal spasm, c-spine surgery (C5-7 ACDF 12/23/15). She is s/p bilateral removal of ruptured silicone implants, capsulectomies, and excision of right back/posterior shoulder submuscular lipoma 12/11/18.   Presurgical cover test is 03/06/2019.  If negative and otherwise no acute changes then anticipate that she can proceed as planned.   VS: BP (!) 159/71   Pulse (!) 52   Temp 36.6 C   Resp 18   Ht 5' 0.5" (1.537 m)   Wt 49.5 kg   SpO2 99%   BMI 20.98 kg/m   PROVIDERS: Crist Infante, MD is PCP  - Dohmeier, Asencion Partridge, MD is neurologist (for OSA) - She is not routinely followed by cardiology, but last seen at [facility] by Melina Copa, PA-C for chest pain in 04/2014 and had a normal stress test. Previously, she saw Thompson Grayer, MD in 2012-2013 for tachypalpitations that improved off HCTZ and after adjustment of her thyroid medication. Her echo and event monitor were unremarkable at that time. She also had a negative dobutamine stress echo at [facility] during ED evaluation for chest pain.   LABS: Labs reviewed: Acceptable for surgery. (all labs ordered are listed, but only abnormal results are displayed)  Labs Reviewed  BASIC METABOLIC PANEL - Abnormal; Notable for the following components:      Result Value   Potassium 3.4 (*)    BUN 26 (*)    GFR calc non Af Amer 56 (*)    All other components within normal limits  CBC     PFTs 06/17/14: FVC 1.92 (77%), FEV1 1.63 (87%). DLCO unc 15.02 (79%).   IMAGES: CXR 06/03/18: FINDINGS: Calcified breast implants. No focal opacity or pleural effusion. Normal heart size. Aortic atherosclerosis. No pneumothorax. Postsurgical changes of the cervical spine. IMPRESSION: No active cardiopulmonary disease.   EKG: EKG 06/03/18: Sinus rhythm Minimal ST depression, inferior leads No significant change since last tracing [from 2015] Confirmed by Isla Pence [phone]) on 06/03/2018 7:13:16 PM - Baseline wanderer in V2-6 and inferior leads may affect interpretation   CV: Stress Echo 11/04/15 [facility] CE): SUMMARY The patient had no chest pain during stress and achieved 90% of maximum  predicted heart rate. Normal left ventricular function at rest. There was normal left ventricular wall motion at rest. No segmental wall motion abnormalities at rest. The estimated LV ejection fraction is 50-55%. Left ventricular diastolic function: Delayed relaxation. No segmental wall motion abnormalities with dobutamine. Normal augmentation of all left ventricular wall segments with dobutamine. The estimated LV ejection fraction is >70% with stress. Negative dobutamine echocardiography for inducible ischemia at target heart  rate.   Nuclear stress test 04/29/14: Overall Impression:  Normal stress nuclear study. LV Ejection Fraction: 86% (overestimated due to small heart size).  LV Wall Motion:  NL LV Function; NL Wall Motion    Echo 04/02/14: Study Conclusions  - Left ventricle: The cavity size was normal. Wall thickness was  normal. Systolic function was vigorous. The estimated ejection  fraction was in the range of  65% to 70%. Wall motion was normal;  there were no regional wall motion abnormalities. Doppler  parameters are consistent with abnormal left ventricular  relaxation (grade 1 diastolic dysfunction). Doppler parameters  are consistent with high  ventricular filling pressure.  - Left atrium: The atrium was mildly dilated.  - Mitral valve: Structurally normal valve. Mobility was not restricted.    There was no evidence for stenosis. There was trivial regurgitation.  Impressions:  - Vigorous LV function; grade 1 diastolic dysfunction; mild LAE;  trace MR.    Past Medical History:  Diagnosis Date  . Anxiety   . Arthritis   . Depression   . Esophageal spasm    a. pt reports prior GI study demonstrating this.  Marland Kitchen GERD (gastroesophageal reflux disease)   . Hypercholesteremia   . Hypertension   . Hypothyroid   . Migraine   . Osteoporosis   . Pneumonia   . PONV (postoperative nausea and vomiting)   . Sinusitis   . Sleep apnea    a. intolerant to CPAP.  Marland Kitchen Tachycardia    a. suspected due to sinus tach. Event monitor/echo unremarkable.    Past Surgical History:  Procedure Laterality Date  . BREAST ENHANCEMENT SURGERY    . cataracts  2016  . CERVICAL SPINE SURGERY    . [place]  . COMBINED AUGMENTATION MAMMAPLASTY AND ABDOMINOPLASTY    . DRUG INDUCED ENDOSCOPY N/A 07/21/2018   Procedure: DRUG INDUCED ENDOSCOPY;  Surgeon: Jerrell Belfast, MD;  Location: [facility];  Service: ENT;  Laterality: N/A;  Drug induced sleep endoscopy  . HEMORROIDECTOMY  2009    MEDICATIONS: . acetaminophen (TYLENOL) 500 MG tablet  . atorvastatin (LIPITOR) 40 MG tablet  . budesonide-formoterol (SYMBICORT) 160-4.5 MCG/ACT inhaler  . BYSTOLIC 5 MG tablet  . chlorthalidone (HYGROTON) 25 MG tablet  . ergocalciferol (VITAMIN D2) 50000 UNITS capsule  . fluticasone (FLONASE) 50 MCG/ACT nasal spray  . irbesartan (AVAPRO) 300 MG tablet  . levothyroxine (SYNTHROID, LEVOTHROID) 100 MCG tablet  . LORazepam (ATIVAN) 0.5 MG tablet  . meclizine (ANTIVERT) 25 MG tablet  . nitroGLYCERIN (NITROSTAT) 0.4 MG SL tablet  . omeprazole (PRILOSEC) 20 MG capsule  . ondansetron (ZOFRAN ODT) 4 MG disintegrating tablet  . Respiratory  Therapy Supplies (FLUTTER) DEVI   No current facility-administered medications for this encounter.     Myra Gianotti, PA-C Surgical Short Stay/Anesthesiology [facility] Phone [phone] [facility] Phone [phone] 03/02/2019 4:37 PM

## 2019-03-05 ENCOUNTER — Other Ambulatory Visit: Payer: Self-pay | Admitting: Otolaryngology

## 2019-03-06 ENCOUNTER — Other Ambulatory Visit (HOSPITAL_COMMUNITY)
Admission: RE | Admit: 2019-03-06 | Discharge: 2019-03-06 | Disposition: A | Discharge status: Home / Self Care | Payer: Medicare Other | Source: Ambulatory Visit | Attending: Otolaryngology | Admitting: Otolaryngology

## 2019-03-06 DIAGNOSIS — Z01812 Encounter for preprocedural laboratory examination: Secondary | ICD-10-CM | POA: Diagnosis present

## 2019-03-06 DIAGNOSIS — Z20828 Contact with and (suspected) exposure to other viral communicable diseases: Secondary | ICD-10-CM | POA: Diagnosis not present

## 2019-03-07 LAB — NOVEL CORONAVIRUS, NAA (HOSP ORDER, SEND-OUT TO REF LAB; TAT 18-24 HRS): SARS-CoV-2, NAA: NOT DETECTED

## 2019-03-09 ENCOUNTER — Ambulatory Visit (HOSPITAL_COMMUNITY): Payer: Medicare Other | Admitting: Anesthesiology

## 2019-03-09 ENCOUNTER — Encounter (HOSPITAL_COMMUNITY): Payer: Self-pay | Admitting: *Deleted

## 2019-03-09 ENCOUNTER — Ambulatory Visit (HOSPITAL_COMMUNITY): Payer: Medicare Other | Admitting: Vascular Surgery

## 2019-03-09 ENCOUNTER — Other Ambulatory Visit: Payer: Self-pay

## 2019-03-09 ENCOUNTER — Observation Stay (HOSPITAL_COMMUNITY)
Admission: RE | Admit: 2019-03-09 | Discharge: 2019-03-10 | Disposition: A | Discharge status: Home / Self Care | Payer: Medicare Other | Attending: Otolaryngology | Admitting: Otolaryngology

## 2019-03-09 ENCOUNTER — Encounter (HOSPITAL_COMMUNITY)
Admission: RE | Disposition: A | Discharge status: Home / Self Care | Payer: Self-pay | Source: Home / Self Care | Attending: Otolaryngology

## 2019-03-09 ENCOUNTER — Observation Stay (HOSPITAL_COMMUNITY): Payer: Medicare Other

## 2019-03-09 DIAGNOSIS — E039 Hypothyroidism, unspecified: Secondary | ICD-10-CM | POA: Diagnosis not present

## 2019-03-09 DIAGNOSIS — Z79899 Other long term (current) drug therapy: Secondary | ICD-10-CM | POA: Diagnosis not present

## 2019-03-09 DIAGNOSIS — J45909 Unspecified asthma, uncomplicated: Secondary | ICD-10-CM | POA: Diagnosis not present

## 2019-03-09 DIAGNOSIS — Z8249 Family history of ischemic heart disease and other diseases of the circulatory system: Secondary | ICD-10-CM | POA: Diagnosis not present

## 2019-03-09 DIAGNOSIS — I10 Essential (primary) hypertension: Secondary | ICD-10-CM | POA: Diagnosis not present

## 2019-03-09 DIAGNOSIS — Z7951 Long term (current) use of inhaled steroids: Secondary | ICD-10-CM | POA: Diagnosis not present

## 2019-03-09 DIAGNOSIS — F419 Anxiety disorder, unspecified: Secondary | ICD-10-CM | POA: Diagnosis not present

## 2019-03-09 DIAGNOSIS — Z79890 Hormone replacement therapy (postmenopausal): Secondary | ICD-10-CM | POA: Insufficient documentation

## 2019-03-09 DIAGNOSIS — K219 Gastro-esophageal reflux disease without esophagitis: Secondary | ICD-10-CM | POA: Diagnosis not present

## 2019-03-09 DIAGNOSIS — G4733 Obstructive sleep apnea (adult) (pediatric): Principal | ICD-10-CM | POA: Diagnosis present

## 2019-03-09 DIAGNOSIS — Z09 Encounter for follow-up examination after completed treatment for conditions other than malignant neoplasm: Secondary | ICD-10-CM

## 2019-03-09 DIAGNOSIS — Z885 Allergy status to narcotic agent status: Secondary | ICD-10-CM | POA: Diagnosis not present

## 2019-03-09 DIAGNOSIS — M1990 Unspecified osteoarthritis, unspecified site: Secondary | ICD-10-CM | POA: Diagnosis not present

## 2019-03-09 DIAGNOSIS — Z981 Arthrodesis status: Secondary | ICD-10-CM | POA: Diagnosis not present

## 2019-03-09 DIAGNOSIS — E7800 Pure hypercholesterolemia, unspecified: Secondary | ICD-10-CM | POA: Insufficient documentation

## 2019-03-09 HISTORY — PX: IMPLANTATION OF HYPOGLOSSAL NERVE STIMULATOR: SHX6827

## 2019-03-09 SURGERY — INSERTION, HYPOGLOSSAL NERVE STIMULATOR
Anesthesia: General | Site: Chest | Laterality: Right

## 2019-03-09 MED ORDER — LIDOCAINE-EPINEPHRINE 1 %-1:100000 IJ SOLN
INTRAMUSCULAR | Status: AC
  Filled 2019-03-09: qty 1

## 2019-03-09 MED ORDER — LEVOTHYROXINE SODIUM 100 MCG PO TABS
100.0000 ug | ORAL_TABLET | Freq: Every day | ORAL | Status: DC
  Administered 2019-03-10: 06:00:00 100 ug via ORAL
  Filled 2019-03-09: qty 1

## 2019-03-09 MED ORDER — LIDOCAINE HCL 1 % IJ SOLN
INTRAMUSCULAR | Status: AC
  Filled 2019-03-09: qty 20

## 2019-03-09 MED ORDER — DEXAMETHASONE SODIUM PHOSPHATE 10 MG/ML IJ SOLN
INTRAMUSCULAR | Status: AC
  Filled 2019-03-09: qty 1

## 2019-03-09 MED ORDER — LIDOCAINE 2% (20 MG/ML) 5 ML SYRINGE
INTRAMUSCULAR | Status: DC | PRN
  Administered 2019-03-09: 20 mg via INTRAVENOUS

## 2019-03-09 MED ORDER — FENTANYL CITRATE (PF) 250 MCG/5ML IJ SOLN
INTRAMUSCULAR | Status: DC | PRN
  Administered 2019-03-09: 100 ug via INTRAVENOUS
  Administered 2019-03-09 (×2): 25 ug via INTRAVENOUS

## 2019-03-09 MED ORDER — FLUTICASONE PROPIONATE 50 MCG/ACT NA SUSP
1.0000 | Freq: Every day | NASAL | Status: DC | PRN
  Filled 2019-03-09: qty 16

## 2019-03-09 MED ORDER — LORAZEPAM 0.5 MG PO TABS: 0.5000 mg | ORAL_TABLET | Freq: Two times a day (BID) | ORAL | Status: DC | PRN

## 2019-03-09 MED ORDER — ONDANSETRON HCL 4 MG/2ML IJ SOLN
INTRAMUSCULAR | Status: AC
  Filled 2019-03-09: qty 2

## 2019-03-09 MED ORDER — EPHEDRINE 5 MG/ML INJ
INTRAVENOUS | Status: AC
  Filled 2019-03-09: qty 10

## 2019-03-09 MED ORDER — CHLORHEXIDINE GLUCONATE CLOTH 2 % EX PADS: 6.0000 | MEDICATED_PAD | Freq: Once | CUTANEOUS | Status: DC

## 2019-03-09 MED ORDER — MOMETASONE FURO-FORMOTEROL FUM 200-5 MCG/ACT IN AERO: 2.0000 | INHALATION_SPRAY | Freq: Two times a day (BID) | RESPIRATORY_TRACT | Status: DC

## 2019-03-09 MED ORDER — NEBIVOLOL HCL 10 MG PO TABS: 5.0000 mg | ORAL_TABLET | Freq: Every day | ORAL | Status: DC

## 2019-03-09 MED ORDER — FENTANYL CITRATE (PF) 100 MCG/2ML IJ SOLN: 25.0000 ug | INTRAMUSCULAR | Status: DC | PRN

## 2019-03-09 MED ORDER — MECLIZINE HCL 25 MG PO TABS: 25.0000 mg | ORAL_TABLET | Freq: Three times a day (TID) | ORAL | Status: DC | PRN

## 2019-03-09 MED ORDER — DEXTROSE IN LACTATED RINGERS 5 % IV SOLN
INTRAVENOUS | Status: DC
  Administered 2019-03-09 – 2019-03-10 (×2): via INTRAVENOUS

## 2019-03-09 MED ORDER — STERILE WATER FOR IRRIGATION IR SOLN
Status: DC | PRN
  Administered 2019-03-09: 1000 mL

## 2019-03-09 MED ORDER — NITROGLYCERIN 0.4 MG SL SUBL: 0.4000 mg | SUBLINGUAL_TABLET | SUBLINGUAL | Status: DC | PRN

## 2019-03-09 MED ORDER — PANTOPRAZOLE SODIUM 40 MG PO TBEC: 40.0000 mg | DELAYED_RELEASE_TABLET | Freq: Every day | ORAL | Status: DC

## 2019-03-09 MED ORDER — LIDOCAINE 2% (20 MG/ML) 5 ML SYRINGE
INTRAMUSCULAR | Status: AC
  Filled 2019-03-09: qty 5

## 2019-03-09 MED ORDER — SODIUM CHLORIDE 0.9 % IV SOLN
INTRAVENOUS | Status: AC
  Filled 2019-03-09: qty 500000

## 2019-03-09 MED ORDER — CHLORTHALIDONE 25 MG PO TABS
25.0000 mg | ORAL_TABLET | Freq: Every day | ORAL | Status: DC
  Filled 2019-03-09: qty 1

## 2019-03-09 MED ORDER — ONDANSETRON HCL 4 MG/2ML IJ SOLN
INTRAMUSCULAR | Status: DC | PRN
  Administered 2019-03-09: 4 mg via INTRAVENOUS

## 2019-03-09 MED ORDER — MIDAZOLAM HCL 5 MG/5ML IJ SOLN
INTRAMUSCULAR | Status: DC | PRN
  Administered 2019-03-09: 1 mg via INTRAVENOUS

## 2019-03-09 MED ORDER — HYDROCODONE-ACETAMINOPHEN 5-325 MG PO TABS: 1.0000 | ORAL_TABLET | Freq: Four times a day (QID) | ORAL | 0 refills | Status: AC | PRN

## 2019-03-09 MED ORDER — PROPOFOL 10 MG/ML IV BOLUS
INTRAVENOUS | Status: DC | PRN
  Administered 2019-03-09: 30 mg via INTRAVENOUS
  Administered 2019-03-09: 50 mg via INTRAVENOUS

## 2019-03-09 MED ORDER — LIDOCAINE-EPINEPHRINE 1 %-1:100000 IJ SOLN
INTRAMUSCULAR | Status: DC | PRN
  Administered 2019-03-09: 8 mL

## 2019-03-09 MED ORDER — ACETAMINOPHEN 500 MG PO TABS
500.0000 mg | ORAL_TABLET | Freq: Four times a day (QID) | ORAL | Status: DC | PRN
  Administered 2019-03-09: 500 mg via ORAL
  Filled 2019-03-09: qty 1

## 2019-03-09 MED ORDER — DEXAMETHASONE SODIUM PHOSPHATE 10 MG/ML IJ SOLN
INTRAMUSCULAR | Status: DC | PRN
  Administered 2019-03-09: 10 mg via INTRAVENOUS

## 2019-03-09 MED ORDER — LACTATED RINGERS IV SOLN
INTRAVENOUS | Status: DC | PRN
  Administered 2019-03-09 (×2): via INTRAVENOUS

## 2019-03-09 MED ORDER — ACETAMINOPHEN 500 MG PO TABS
1000.0000 mg | ORAL_TABLET | Freq: Once | ORAL | Status: AC
  Administered 2019-03-09: 1000 mg via ORAL
  Filled 2019-03-09: qty 2

## 2019-03-09 MED ORDER — VITAMIN D (ERGOCALCIFEROL) 1.25 MG (50000 UNIT) PO CAPS
50000.0000 [IU] | ORAL_CAPSULE | ORAL | Status: DC
  Filled 2019-03-09: qty 1

## 2019-03-09 MED ORDER — SODIUM CHLORIDE 0.9 % IV SOLN
INTRAVENOUS | Status: DC | PRN
  Administered 2019-03-09: 500 mL

## 2019-03-09 MED ORDER — ROCURONIUM BROMIDE 10 MG/ML (PF) SYRINGE
PREFILLED_SYRINGE | INTRAVENOUS | Status: AC
  Filled 2019-03-09: qty 10

## 2019-03-09 MED ORDER — FENTANYL CITRATE (PF) 250 MCG/5ML IJ SOLN
INTRAMUSCULAR | Status: AC
  Filled 2019-03-09: qty 5

## 2019-03-09 MED ORDER — ONDANSETRON 4 MG PO TBDP: 4.0000 mg | ORAL_TABLET | Freq: Three times a day (TID) | ORAL | Status: DC | PRN

## 2019-03-09 MED ORDER — 0.9 % SODIUM CHLORIDE (POUR BTL) OPTIME
TOPICAL | Status: DC | PRN
  Administered 2019-03-09: 1000 mL

## 2019-03-09 MED ORDER — SODIUM CHLORIDE 0.9 % IV SOLN
INTRAVENOUS | Status: DC | PRN
  Administered 2019-03-09: 15 ug/min via INTRAVENOUS

## 2019-03-09 MED ORDER — IBUPROFEN 400 MG PO TABS
400.0000 mg | ORAL_TABLET | Freq: Four times a day (QID) | ORAL | Status: DC | PRN
  Administered 2019-03-09: 400 mg via ORAL
  Filled 2019-03-09: qty 1

## 2019-03-09 MED ORDER — PROPOFOL 500 MG/50ML IV EMUL
INTRAVENOUS | Status: DC | PRN
  Administered 2019-03-09: 25 ug/kg/min via INTRAVENOUS

## 2019-03-09 MED ORDER — PROPOFOL 10 MG/ML IV BOLUS
INTRAVENOUS | Status: AC
  Filled 2019-03-09: qty 40

## 2019-03-09 MED ORDER — PROPOFOL 10 MG/ML IV BOLUS
INTRAVENOUS | Status: AC
  Filled 2019-03-09: qty 20

## 2019-03-09 MED ORDER — ATORVASTATIN CALCIUM 40 MG PO TABS: 40.0000 mg | ORAL_TABLET | Freq: Every day | ORAL | Status: DC

## 2019-03-09 MED ORDER — MIDAZOLAM HCL 2 MG/2ML IJ SOLN
INTRAMUSCULAR | Status: AC
  Filled 2019-03-09: qty 2

## 2019-03-09 MED ORDER — MORPHINE SULFATE (PF) 2 MG/ML IV SOLN
2.0000 mg | INTRAVENOUS | Status: DC | PRN
  Administered 2019-03-09: 13:00:00 4 mg via INTRAVENOUS
  Filled 2019-03-09: qty 2

## 2019-03-09 MED ORDER — HYDROCODONE-ACETAMINOPHEN 5-325 MG PO TABS
1.0000 | ORAL_TABLET | ORAL | Status: DC | PRN
  Administered 2019-03-09 – 2019-03-10 (×4): 2 via ORAL
  Filled 2019-03-09 (×4): qty 2

## 2019-03-09 MED ORDER — IRBESARTAN 300 MG PO TABS: 300.0000 mg | ORAL_TABLET | Freq: Every day | ORAL | Status: DC

## 2019-03-09 MED ORDER — EPHEDRINE SULFATE-NACL 50-0.9 MG/10ML-% IV SOSY
PREFILLED_SYRINGE | INTRAVENOUS | Status: DC | PRN
  Administered 2019-03-09 (×2): 10 mg via INTRAVENOUS
  Administered 2019-03-09 (×4): 5 mg via INTRAVENOUS

## 2019-03-09 MED ORDER — SUCCINYLCHOLINE CHLORIDE 20 MG/ML IJ SOLN
INTRAMUSCULAR | Status: DC | PRN
  Administered 2019-03-09: 100 mg via INTRAVENOUS

## 2019-03-09 MED ORDER — SUCCINYLCHOLINE CHLORIDE 200 MG/10ML IV SOSY
PREFILLED_SYRINGE | INTRAVENOUS | Status: AC
  Filled 2019-03-09: qty 10

## 2019-03-09 MED ORDER — CEFAZOLIN SODIUM-DEXTROSE 2-4 GM/100ML-% IV SOLN
2.0000 g | INTRAVENOUS | Status: AC
  Administered 2019-03-09: 2 g via INTRAVENOUS
  Filled 2019-03-09: qty 100

## 2019-03-09 SURGICAL SUPPLY — 72 items
ADH SKN CLS APL DERMABOND .7 (GAUZE/BANDAGES/DRESSINGS) ×1
BAG DECANTER FOR FLEXI CONT (MISCELLANEOUS) ×2 IMPLANT
BLADE CLIPPER SURG (BLADE) IMPLANT
BLADE SURG 15 STRL LF DISP TIS (BLADE) ×3 IMPLANT
BLADE SURG 15 STRL SS (BLADE) ×2
CANISTER SUCT 3000ML PPV (MISCELLANEOUS) ×2 IMPLANT
CORD BIPOLAR FORCEPS 12FT (ELECTRODE) ×2 IMPLANT
COVER PROBE W GEL 5X96 (DRAPES) ×2 IMPLANT
COVER SURGICAL LIGHT HANDLE (MISCELLANEOUS) ×2 IMPLANT
DERMABOND ADVANCED (GAUZE/BANDAGES/DRESSINGS) ×1
DERMABOND ADVANCED .7 DNX12 (GAUZE/BANDAGES/DRESSINGS) ×2 IMPLANT
DRAPE C-ARM 35X43 STRL (DRAPES) ×2 IMPLANT
DRAPE HALF SHEET 40X57 (DRAPES) ×2 IMPLANT
DRAPE HEAD BAR (DRAPES) ×2 IMPLANT
DRAPE INCISE IOBAN 66X45 STRL (DRAPES) ×2 IMPLANT
DRAPE MICROSCOPE LEICA 54X105 (DRAPE) ×2 IMPLANT
DRAPE UTILITY XL STRL (DRAPES) ×1 IMPLANT
DRSG TEGADERM 2-3/8X2-3/4 SM (GAUZE/BANDAGES/DRESSINGS) ×5 IMPLANT
ELECT COATED BLADE 2.86 ST (ELECTRODE) ×2 IMPLANT
ELECT EMG 18 NIMS (NEUROSURGERY SUPPLIES) ×2
ELECTRODE EMG 18 NIMS (NEUROSURGERY SUPPLIES) ×1 IMPLANT
FORCEPS BIPOLAR SPETZLER 8 1.0 (NEUROSURGERY SUPPLIES) ×2 IMPLANT
GAUZE 4X4 16PLY RFD (DISPOSABLE) ×2 IMPLANT
GAUZE SPONGE 4X4 12PLY STRL (GAUZE/BANDAGES/DRESSINGS) ×2 IMPLANT
GENERATOR PULSE INSPIRE (Generator) ×2 IMPLANT
GENERATOR PULSE INSPIRE IV (Generator) ×1 IMPLANT
GLOVE BIO SURGEON STRL SZ 6.5 (GLOVE) ×1 IMPLANT
GLOVE BIOGEL M 7.0 STRL (GLOVE) ×2 IMPLANT
GLOVE SS BIOGEL STRL SZ 7 (GLOVE) IMPLANT
GLOVE SUPERSENSE BIOGEL SZ 7 (GLOVE) ×1
GLOVE SURG SS PI 6.5 STRL IVOR (GLOVE) ×1 IMPLANT
GOWN STRL REUS W/ TWL LRG LVL3 (GOWN DISPOSABLE) ×1 IMPLANT
GOWN STRL REUS W/TWL LRG LVL3 (GOWN DISPOSABLE) ×4
KIT BASIN OR (CUSTOM PROCEDURE TRAY) ×2 IMPLANT
KIT NEUROSTIMULATOR ACCESSORY (KITS) IMPLANT
KIT TURNOVER KIT B (KITS) ×2 IMPLANT
LEAD SENSING RESP INSPIRE (Lead) ×2 IMPLANT
LEAD SENSING RESP INSPIRE IV (Lead) ×1 IMPLANT
LEAD SLEEP STIM INSPIRE IV/V (Lead) ×1 IMPLANT
LEAD SLEEP STIMULATION INSPIRE (Lead) ×2 IMPLANT
LOOP VESSEL MAXI BLUE (MISCELLANEOUS) ×2 IMPLANT
LOOP VESSEL MINI RED (MISCELLANEOUS) ×1 IMPLANT
MARKER SKIN DUAL TIP RULER LAB (MISCELLANEOUS) ×4 IMPLANT
NDL HYPO 25GX1X1/2 BEV (NEEDLE) ×1 IMPLANT
NEEDLE HYPO 25GX1X1/2 BEV (NEEDLE) ×2 IMPLANT
NS IRRIG 1000ML POUR BTL (IV SOLUTION) ×2 IMPLANT
PAD ARMBOARD 7.5X6 YLW CONV (MISCELLANEOUS) ×2 IMPLANT
PASSER CATH 38CM DISP (INSTRUMENTS) ×2 IMPLANT
PENCIL BUTTON HOLSTER BLD 10FT (ELECTRODE) ×2 IMPLANT
POSITIONER HEAD DONUT 9IN (MISCELLANEOUS) ×2 IMPLANT
PROBE NERVE STIMULATOR (NEUROSURGERY SUPPLIES) ×2 IMPLANT
REMOTE CONTROL SLEEP INSPIRE (MISCELLANEOUS) ×2 IMPLANT
SET WALTER ACTIVATION W/DRAPE (SET/KITS/TRAYS/PACK) ×1 IMPLANT
SLEEVE SURGEON STRL (DRAPES) ×1 IMPLANT
SLING ARM FOAM STRAP LRG (SOFTGOODS) IMPLANT
SLING ARM FOAM STRAP MED (SOFTGOODS) ×1 IMPLANT
SPONGE INTESTINAL PEANUT (DISPOSABLE) ×2 IMPLANT
STAPLER VISISTAT 35W (STAPLE) ×2 IMPLANT
SUT SILK 2 0 SH (SUTURE) ×2 IMPLANT
SUT SILK 3 0 RB1 (SUTURE) ×4 IMPLANT
SUT SILK 3 0 REEL (SUTURE) ×2 IMPLANT
SUT SILK 3 0 SH 30 (SUTURE) ×4 IMPLANT
SUT SILK 3-0 (SUTURE) ×4
SUT SILK 3-0 RB1 30XBRD (SUTURE) ×2
SUT VIC AB 4-0 PS2 27 (SUTURE) ×4 IMPLANT
SUT VIC AB 5-0 P-3 18XBRD (SUTURE) ×2 IMPLANT
SUT VIC AB 5-0 P3 18 (SUTURE) ×4
SUTURE SILK 3-0 RB1 30XBRD (SUTURE) ×2 IMPLANT
SYR 10ML LL (SYRINGE) ×2 IMPLANT
TAPE CLOTH SURG 4X10 WHT LF (GAUZE/BANDAGES/DRESSINGS) ×1 IMPLANT
TOWEL GREEN STERILE (TOWEL DISPOSABLE) ×2 IMPLANT
TRAY ENT MC OR (CUSTOM PROCEDURE TRAY) ×2 IMPLANT

## 2019-03-09 NOTE — Anesthesia Postprocedure Evaluation (Signed)
Anesthesia Post Note  Patient: Elizabeth Walker  Procedure(s) Performed: IMPLANTATION OF HYPOGLOSSAL NERVE STIMULATOR (Right Chest)     Patient location during evaluation: PACU Anesthesia Type: General Level of consciousness: awake and alert Pain management: pain level controlled Vital Signs Assessment: post-procedure vital signs reviewed and stable Respiratory status: spontaneous breathing, nonlabored ventilation, respiratory function stable and patient connected to nasal cannula oxygen Cardiovascular status: blood pressure returned to baseline and stable Postop Assessment: no apparent nausea or vomiting Anesthetic complications: no    Last Vitals:  Vitals:   03/09/19 1137 03/09/19 1152  BP: 131/61 (!) 136/54  Pulse: 67 66  Resp: (!) 24 16  Temp:    SpO2: 93% 96%    Last Pain:  Vitals:   03/09/19 1152  TempSrc:   PainSc: 0-No pain                 Shaquana Buel L Airabella Barley

## 2019-03-09 NOTE — H&P (Signed)
Elizabeth Walker is an 73 y.o. female.   Chief Complaint: Obstructive Sleep Apnea HPI: Pt with hx of OSA, unable to tol CPAP  Past Medical History:  Diagnosis Date  . Anxiety   . Arthritis   . Depression   . Esophageal spasm    a. pt reports prior GI study demonstrating this.  Marland Kitchen GERD (gastroesophageal reflux disease)   . Hypercholesteremia   . Hypertension   . Hypothyroid   . Migraine   . Osteoporosis   . Pneumonia   . PONV (postoperative nausea and vomiting)   . Sinusitis   . Sleep apnea    a. intolerant to CPAP.  Marland Kitchen Tachycardia    a. suspected due to sinus tach. Event monitor/echo unremarkable.    Past Surgical History:  Procedure Laterality Date  . BREAST ENHANCEMENT SURGERY    . cataracts  2016  . CERVICAL SPINE SURGERY     C5-7 ACDF 12/23/2015  . [place]  . COMBINED AUGMENTATION MAMMAPLASTY AND ABDOMINOPLASTY    . DRUG INDUCED ENDOSCOPY N/A 07/21/2018   Procedure: DRUG INDUCED ENDOSCOPY;  Surgeon: Jerrell Belfast, MD;  Location: [facility];  Service: ENT;  Laterality: N/A;  Drug induced sleep endoscopy  . HEMORROIDECTOMY  2009    Family History  Problem Relation Age of Onset  . Coronary artery disease Other   . CAD Father        died at 28 - massive heart attack  . Emphysema Father        smoked  . Asthma Father   . Heart disease Father   . Heart disease Sister        had open heart surgery age 81  . Diabetes Sister   . Asthma Brother   . CAD Brother        had open heart surgery at 48  . Kidney failure Brother   . Stroke Sister   . Sjogren's syndrome Mother    Social History:  reports that she has never smoked. She has never used smokeless tobacco. She reports current alcohol use. She reports that she does not use drugs.  Allergies:  Allergies  Allergen Reactions  . Codeine Nausea And Vomiting  . Pollen Extract Other (See Comments)    Stuffy nose    Medications Prior to Admission  Medication Sig Dispense Refill  .  acetaminophen (TYLENOL) 500 MG tablet Take 500 mg by mouth every 6 (six) hours as needed (for pain.).     Marland Kitchen atorvastatin (LIPITOR) 40 MG tablet TAKE ONE TABLET BY MOUTH ONE TIME DAILY  (Patient taking differently: Take 40 mg by mouth daily. ) 90 tablet 0  . budesonide-formoterol (SYMBICORT) 160-4.5 MCG/ACT inhaler Inhale 2 puffs into the lungs 2 (two) times daily as needed (respiratory issues.).     Marland Kitchen BYSTOLIC 5 MG tablet TAKE 1 TABLET EVERY DAY (Patient taking differently: Take 5 mg by mouth daily. ) 30 tablet 0  . chlorthalidone (HYGROTON) 25 MG tablet Take 25 mg by mouth daily.    . ergocalciferol (VITAMIN D2) 50000 UNITS capsule Take 50,000 Units by mouth every Saturday.     . fluticasone (FLONASE) 50 MCG/ACT nasal spray Place 1-2 sprays into both nostrils daily as needed for allergies or rhinitis.    Marland Kitchen irbesartan (AVAPRO) 300 MG tablet Take 300 mg by mouth daily.    Marland Kitchen levothyroxine (SYNTHROID, LEVOTHROID) 100 MCG tablet TAKE 1 TABLET BY MOUTH ONCE DAILY (Patient taking differently: Take 100 mcg by mouth  daily before breakfast. ) 30 tablet 0  . LORazepam (ATIVAN) 0.5 MG tablet Take 0.5 mg by mouth 2 (two) times daily as needed (anxiety).     . meclizine (ANTIVERT) 25 MG tablet Take 1 tablet (25 mg total) by mouth 3 (three) times daily as needed for dizziness. 30 tablet 0  . omeprazole (PRILOSEC) 20 MG capsule Take 20 mg by mouth daily before breakfast.    . nitroGLYCERIN (NITROSTAT) 0.4 MG SL tablet Place 1 tablet (0.4 mg total) under the tongue every 5 (five) minutes as needed for chest pain. 20 tablet 0  . ondansetron (ZOFRAN ODT) 4 MG disintegrating tablet Take 1 tablet (4 mg total) by mouth every 8 (eight) hours as needed. 10 tablet 0  . Respiratory Therapy Supplies (FLUTTER) DEVI Use as directed 1 each 0    No results found for this or any previous visit (from the past 51 hour(s)). No results found.  Review of Systems  Constitutional: Negative.   HENT: Negative.   Respiratory:  Negative.   Cardiovascular: Negative.     Blood pressure (!) 139/57, pulse (!) 50, temperature 98.7 F (37.1 C), temperature source Oral, resp. rate 18, SpO2 100 %. Physical Exam  Constitutional: She appears well-developed and well-nourished.  Neck: Normal range of motion. Neck supple.  Respiratory: Effort normal.  GI: Soft.     Assessment/Plan Adm for Inspire Implant for hypoglossal nerve stim  Jerrell Belfast, MD 03/09/2019, 7:36 AM

## 2019-03-09 NOTE — Op Note (Signed)
Operative Note: INSPIRE IMPLANT  Patient: Elizabeth Walker  Medical record number: [id number]  Date:03/09/2019  Pre-operative Indications: Moderate/severe obstructive sleep apnea with positive airway pressure intolerance  Postoperative Indications: Same  Surgical Procedure:  1.  12th cranial nerve (hypoglossal) stimulation implant    2.  Placement of chest wall respiratory sensor    3.  Electronic analysis of implanted neurostimulator with pulse generator system  Anesthesia: GET  Surgeon: Delsa Bern, M.D.  Assist: Etta Grandchild, PA  Complications: None  EBL: 50 cc   Brief History: The patient is a 73 y.o. female with a history of moderate/severe obstructive sleep apnea.  The patient has undergone work-up including sleep study and trial of CPAP which they did not tolerate.  Patient is unable to use long-term CPAP for control of obstructive sleep apnea.  Patient underwent DISE which showed anterior to posterior airway obstruction, considered a good candidate for Inspire Implant. Given the patient's history and findings, I recommended Inspire Implant under general anesthesia, risks and benefits were discussed in detail with the patient and family. They understand and agree with our plan for surgery which is scheduled at [facility] on an elective basis.  Surgical Procedure: The patient is brought to the operating room on 03/09/2019 and placed in supine position on the operating table. General endotracheal anesthesia was established without difficulty. When the patient was adequately anesthetized, surgical timeout was performed and correct identification of the patient and the surgical procedure. The patient was injected with 8 cc of 1% lidocaine 1:100,000 dilution epinephrine in subcutaneous fashion in the proposed skin incisions.  The patient was positioned and prepped and draped in sterile fashion.  The NIMS monitoring system was positioned and electrodes were placed in the  anterior floor of mouth and tongue to assess the genioglossus and styloglossus muscle groups.  Nerve monitoring was used throughout the surgical procedure.  The procedure was begun by creating a modified right submandibular incision in the upper anterior lateral neck ~2 cm below the mandible and in a natural skin crease.  The incision was carried through the skin and underlying subcutaneous tissue to the level of the platysma.  Platysma muscle was divided and subplatysmal flaps were elevated superiorly and inferiorly.  The submandibular space was entered and the submandibular gland was identified and retracted posteriorly.  The digastric tendon was identified and dissection was carried out anterior and posterior along the tendon and muscle bellies.  The mylohyoid muscle was identified and retracted anteriorly and the hypoglossal nerve was carefully dissected across the anterior floor of the submandibular space.  Lateral branches to the retrusor muscles were identified and tested intraoperatively using the NIMS stimulator.  Stimulation electrode cuff for the hypoglossal nerve stimulator was placed distal to these branches on the medial hypoglossal nerve branch innervating the genioglossus muscle.  Diagnostic evaluation confirmed activation of the genioglossus muscle, resulting in genioglossal activation and tongue protrusion which was visually confirmed in the operating room.  The stimulation electrode was  sutured to the digastric tendon with interrupted 4-0 silk sutures.  A second second incision was created in the anterior upper chest wall 5 cm below the clavicle.  Incision was carried through the skin and underlying subcutaneous tissue to the level of the pectoralis major muscle.  The IPG pocket was created deep to the subcutaneous layer and superficial to the muscle.  Stimulation lead was then tunneled in a subplatysmal plane and brought out into the subclavicular pocket.  Third incision was made in the  axillary line measuring approximately 5 cm.  Dissection was carried down through the skin and underlying subcutaneous tissue.  The serratus anterior muscle was identified and separated.  The external oblique muscle was identified and incised and a tunnel was created between the external and internal intercostal muscles in the approximately fifth intercostal space on the superior edge of the sixth rib.  The pleural sensor was then placed in the intercostal pocket.  Leads were sutured with 3-0 silk suture to the provided anchors which were used to fix the sensor in its proper orientation facing the pleural space.  The sensing lead was then tunneled in a subcutaneous plane to the subclavicular pocket.  The stimulating electrode and respiration sensing lead were connected to the implantable pulse generator.  Diagnostic evaluation was run confirming respiratory signal and good tongue protrusion on stimulation.  The implantable pulse generator was then placed in the subclavicular pocket and sutured to the pectoralis fascia with 4-0 silk sutures sutures.  The incisions were thoroughly irrigated with bacitracin saline irrigation.  The incisions were then closed in multiple layers with 4-0 and 5-0 Vicryl interrupted sutures in the deep and superficial subcutaneous levels at each incision site.  Dermabond surgical glue was used for skin closure.  The patient's incisions were dressed with rolled gauze and Hypafix tape for pressure dressing.  The patient's right arm was placed in a sling.  An orogastric tube was passed and stomach contents were aspirated. Patient was awakened from anesthetic and transferred from the operating room to the recovery room in stable condition. There were no complications and blood loss was minimal.  X-rays were obtained in the recovery room to assess the proper location of the pulse generator, sensor lead and stimulation lead.   Delsa Bern, M.D. [facility] 03/09/2019

## 2019-03-09 NOTE — Anesthesia Procedure Notes (Signed)
Procedure Name: Intubation Date/Time: 03/09/2019 7:51 AM Performed by: Janene Harvey, CRNA Pre-anesthesia Checklist: Patient identified, Emergency Drugs available, Suction available and Patient being monitored Patient Re-evaluated:Patient Re-evaluated prior to induction Oxygen Delivery Method: Circle system utilized Preoxygenation: Pre-oxygenation with 100% oxygen Induction Type: IV induction Ventilation: Mask ventilation without difficulty Laryngoscope Size: Mac, Glidescope and 3 Grade View: Grade I Tube type: Oral Tube size: 7.0 mm Number of attempts: 1 Airway Equipment and Method: Stylet and Oral airway Placement Confirmation: ETT inserted through vocal cords under direct vision,  positive ETCO2 and breath sounds checked- equal and bilateral Secured at: 21 cm Tube secured with: Tape Dental Injury: Teeth and Oropharynx as per pre-operative assessment  Comments: Elective glidescope (dental work)

## 2019-03-09 NOTE — Transfer of Care (Signed)
Immediate Anesthesia Transfer of Care Note  Patient: Elizabeth Walker  Procedure(s) Performed: IMPLANTATION OF HYPOGLOSSAL NERVE STIMULATOR (Right Chest)  Patient Location: PACU  Anesthesia Type:General  Level of Consciousness: drowsy  Airway & Oxygen Therapy: Patient Spontanous Breathing and Patient connected to face mask oxygen  Post-op Assessment: Report given to RN and Post -op Vital signs reviewed and stable  Post vital signs: Reviewed  Last Vitals:  Vitals Value Taken Time  BP 128/56 03/09/19 1052  Temp    Pulse 78 03/09/19 1056  Resp 36 03/09/19 1056  SpO2 100 % 03/09/19 1056  Vitals shown include unvalidated device data.  Last Pain:  Vitals:   03/09/19 0642  TempSrc:   PainSc: 0-No pain      Patients Stated Pain Goal: 7 ([id number])  Complications: No apparent anesthesia complications

## 2019-03-09 NOTE — Progress Notes (Signed)
   ENT Progress Note:  s/p Procedure(s): IMPLANTATION OF HYPOGLOSSAL NERVE STIMULATOR   Subjective: C/O incisional pain  Objective: Vital signs in last 24 hours: Temp:  [97 F (36.1 C)-98.7 F (37.1 C)] 97.4 F (36.3 C) (09/25 1527) Pulse Rate:  [50-77] 64 (09/25 1527) Resp:  [15-24] 15 (09/25 1527) BP: (117-139)/(54-76) 130/56 (09/25 1527) SpO2:  [66 %-100 %] 66 % (09/25 1237) Weight change:  Last BM Date: 03/08/19  Intake/Output from previous day: No intake/output data recorded. Intake/Output this shift: Total I/O In: 1153.4 [I.V.:1153.4] Out: 25 [Blood:25]  Labs: No results for input(s): WBC, HGB, HCT, PLT in the last 72 hours. No results for input(s): NA, K, CL, CO2, GLUCOSE, BUN, CALCIUM in the last 72 hours.  Invalid input(s): CREATININR  Studies/Results: Dg Neck Soft Tissue  Result Date: 03/09/2019 CLINICAL DATA:  Status post implantation of a hypoglossal nerve stimulator. EXAM: NECK SOFT TISSUES - 1+ VIEW COMPARISON:  None. FINDINGS: Single AP view is provided. Image demonstrates a generator device over the right upper chest with a lead extending into the neck toward the base of the tongue on the right and a second lead extending toward the right hemidiaphragm. Hardware is intact. IMPRESSION: Status post hypoglossal nerve stimulator placement. No acute finding. Electronically Signed   By: Inge Rise M.D.   On: 03/09/2019 11:56   Dg Chest Port 1 View  Result Date: 03/09/2019 CLINICAL DATA:  Hypoglossal nerve stimulator implant. EXAM: PORTABLE CHEST 1 VIEW COMPARISON:  June 03, 2018 FINDINGS: There is a stimulator pack projecting over the right chest wall with leads extending inferiorly and superiorly. There is no right-sided pneumothorax. The lungs are clear. The heart size is stable. There is no acute osseous abnormality. The patient is status post prior ACDF of the lower cervical spine. IMPRESSION: No pneumothorax.  Stimulator pack as above. Electronically  Signed   By: Constance Holster M.D.   On: 03/09/2019 11:53     PHYSICAL EXAM: Inc intact - no swelling  Normal nerve function   Assessment/Plan: Monitor O/N     Jerrell Belfast 03/09/2019, 4:53 PM

## 2019-03-10 DIAGNOSIS — G4733 Obstructive sleep apnea (adult) (pediatric): Secondary | ICD-10-CM | POA: Diagnosis not present

## 2019-03-10 NOTE — Progress Notes (Signed)
   ENT Progress Note: POD #1 s/p Procedure(s): IMPLANTATION OF HYPOGLOSSAL NERVE STIMULATOR   Subjective: tol po, min pain  Objective: Vital signs in last 24 hours: Temp:  [97 F (36.1 C)-97.8 F (36.6 C)] 97.3 F (36.3 C) (09/26 0153) Pulse Rate:  [62-77] 67 (09/26 0153) Resp:  [15-24] 18 (09/26 0153) BP: (99-136)/(53-76) 118/53 (09/26 0153) SpO2:  [66 %-100 %] 100 % (09/26 0153) Weight:  [50.1 kg] 50.1 kg (09/25 1945) Weight change:  Last BM Date: 03/08/19  Intake/Output from previous day: 09/25 0701 - 09/26 0700 In: 2511.2 [P.O.:240; I.V.:2271.2] Out: 25 [Blood:25] Intake/Output this shift: Total I/O In: 120 [P.O.:120] Out: -   Labs: No results for input(s): WBC, HGB, HCT, PLT in the last 72 hours. No results for input(s): NA, K, CL, CO2, GLUCOSE, BUN, CALCIUM in the last 72 hours.  Invalid input(s): CREATININR  Studies/Results: Dg Neck Soft Tissue  Result Date: 03/09/2019 CLINICAL DATA:  Placement of hypoglossal nerve stimulator. EXAM: NECK SOFT TISSUES - 1+ VIEW COMPARISON:  None. FINDINGS: Hypoglossal nerve stimulator is noted on the right side. No complicating features are demonstrated. Prior cervical fusion hardware noted. No abnormal prevertebral or retropharyngeal soft tissue swelling. IMPRESSION: Hypoglossal nerve stimulator in place without complicating features. Electronically Signed   By: Marijo Sanes M.D.   On: 03/09/2019 17:28   Dg Neck Soft Tissue  Result Date: 03/09/2019 CLINICAL DATA:  Status post implantation of a hypoglossal nerve stimulator. EXAM: NECK SOFT TISSUES - 1+ VIEW COMPARISON:  None. FINDINGS: Single AP view is provided. Image demonstrates a generator device over the right upper chest with a lead extending into the neck toward the base of the tongue on the right and a second lead extending toward the right hemidiaphragm. Hardware is intact. IMPRESSION: Status post hypoglossal nerve stimulator placement. No acute finding. Electronically  Signed   By: Inge Rise M.D.   On: 03/09/2019 11:56   Dg Chest Port 1 View  Result Date: 03/09/2019 CLINICAL DATA:  Hypoglossal nerve stimulator implant. EXAM: PORTABLE CHEST 1 VIEW COMPARISON:  June 03, 2018 FINDINGS: There is a stimulator pack projecting over the right chest wall with leads extending inferiorly and superiorly. There is no right-sided pneumothorax. The lungs are clear. The heart size is stable. There is no acute osseous abnormality. The patient is status post prior ACDF of the lower cervical spine. IMPRESSION: No pneumothorax.  Stimulator pack as above. Electronically Signed   By: Constance Holster M.D.   On: 03/09/2019 11:53     PHYSICAL EXAM: Inc's are intact, no swelling or erythema Tongue and lip movement normal   Assessment/Plan: Pt stable, d/c to home F/U in 2 wks    Jerrell Belfast 03/10/2019, 9:20 AM

## 2019-03-10 NOTE — Discharge Summary (Signed)
Physician Discharge Summary  Patient ID: Elizabeth Walker MRN: [id number] DOB/AGE: 75-30-1947 75 y.o.  Admit date: 03/09/2019 Discharge date: 03/10/2019  Admission Diagnoses:  Active Problems:   Obstructive sleep apnea   Discharge Diagnoses:  Same  Surgeries: Procedure(s): IMPLANTATION OF HYPOGLOSSAL NERVE STIMULATOR on 03/09/2019   Consultants: None  Discharged Condition: Improved  Hospital Course: Elizabeth Walker is an 73 y.o. female who was admitted 03/09/2019 with a diagnosis of Active Problems:   Obstructive sleep apnea  and went to the operating room on 03/09/2019 and underwent the above named procedures.   Patient stable POD #1, d/c to home.  Recent vital signs:  Vitals:   03/09/19 2136 03/10/19 0153  BP: (!) 99/55 (!) 118/53  Pulse: 76 67  Resp: 18 18  Temp: (!) 97.3 F (36.3 C) (!) 97.3 F (36.3 C)  SpO2: 100% 100%    Recent laboratory studies:  Results for orders placed or performed during the hospital encounter of 03/06/19  Novel Coronavirus, NAA (Hosp order, Send-out to Ref Lab; TAT 18-24 hrs   Specimen: Nasopharyngeal Swab; Respiratory  Result Value Ref Range   SARS-CoV-2, NAA NOT DETECTED NOT DETECTED   Coronavirus Source NASOPHARYNGEAL     Discharge Medications:   Allergies as of 03/10/2019      Reactions   Codeine Nausea And Vomiting   Pollen Extract Other (See Comments)   Stuffy nose      Medication List    TAKE these medications   acetaminophen 500 MG tablet Commonly known as: TYLENOL Take 500 mg by mouth every 6 (six) hours as needed (for pain.).   atorvastatin 40 MG tablet Commonly known as: LIPITOR TAKE ONE TABLET BY MOUTH ONE TIME DAILY   budesonide-formoterol 160-4.5 MCG/ACT inhaler Commonly known as: SYMBICORT Inhale 2 puffs into the lungs 2 (two) times daily as needed (respiratory issues.).   Bystolic 5 MG tablet Generic drug: nebivolol TAKE 1 TABLET EVERY DAY What changed: how much to take   chlorthalidone 25 MG  tablet Commonly known as: HYGROTON Take 25 mg by mouth daily.   ergocalciferol 1.25 MG (50000 UT) capsule Commonly known as: VITAMIN D2 Take 50,000 Units by mouth every Saturday.   fluticasone 50 MCG/ACT nasal spray Commonly known as: FLONASE Place 1-2 sprays into both nostrils daily as needed for allergies or rhinitis.   Flutter Devi Use as directed   HYDROcodone-acetaminophen 5-325 MG tablet Commonly known as: Norco Take 1-2 tablets by mouth every 6 (six) hours as needed for up to 5 days for moderate pain.   irbesartan 300 MG tablet Commonly known as: AVAPRO Take 300 mg by mouth daily.   levothyroxine 100 MCG tablet Commonly known as: SYNTHROID TAKE 1 TABLET BY MOUTH ONCE DAILY What changed: when to take this   LORazepam 0.5 MG tablet Commonly known as: ATIVAN Take 0.5 mg by mouth 2 (two) times daily as needed (anxiety).   meclizine 25 MG tablet Commonly known as: ANTIVERT Take 1 tablet (25 mg total) by mouth 3 (three) times daily as needed for dizziness.   nitroGLYCERIN 0.4 MG SL tablet Commonly known as: NITROSTAT Place 1 tablet (0.4 mg total) under the tongue every 5 (five) minutes as needed for chest pain.   omeprazole 20 MG capsule Commonly known as: PRILOSEC Take 20 mg by mouth daily before breakfast.   ondansetron 4 MG disintegrating tablet Commonly known as: Zofran ODT Take 1 tablet (4 mg total) by mouth every 8 (eight) hours as needed.  Diagnostic Studies: Dg Neck Soft Tissue  Result Date: 03/09/2019 CLINICAL DATA:  Placement of hypoglossal nerve stimulator. EXAM: NECK SOFT TISSUES - 1+ VIEW COMPARISON:  None. FINDINGS: Hypoglossal nerve stimulator is noted on the right side. No complicating features are demonstrated. Prior cervical fusion hardware noted. No abnormal prevertebral or retropharyngeal soft tissue swelling. IMPRESSION: Hypoglossal nerve stimulator in place without complicating features. Electronically Signed   By: Marijo Sanes M.D.    On: 03/09/2019 17:28   Dg Neck Soft Tissue  Result Date: 03/09/2019 CLINICAL DATA:  Status post implantation of a hypoglossal nerve stimulator. EXAM: NECK SOFT TISSUES - 1+ VIEW COMPARISON:  None. FINDINGS: Single AP view is provided. Image demonstrates a generator device over the right upper chest with a lead extending into the neck toward the base of the tongue on the right and a second lead extending toward the right hemidiaphragm. Hardware is intact. IMPRESSION: Status post hypoglossal nerve stimulator placement. No acute finding. Electronically Signed   By: Inge Rise M.D.   On: 03/09/2019 11:56   Dg Chest Port 1 View  Result Date: 03/09/2019 CLINICAL DATA:  Hypoglossal nerve stimulator implant. EXAM: PORTABLE CHEST 1 VIEW COMPARISON:  June 03, 2018 FINDINGS: There is a stimulator pack projecting over the right chest wall with leads extending inferiorly and superiorly. There is no right-sided pneumothorax. The lungs are clear. The heart size is stable. There is no acute osseous abnormality. The patient is status post prior ACDF of the lower cervical spine. IMPRESSION: No pneumothorax.  Stimulator pack as above. Electronically Signed   By: Constance Holster M.D.   On: 03/09/2019 11:53    Disposition: Discharge disposition: 01-Home or Self Care       Discharge Instructions    Diet - low sodium heart healthy   Complete by: As directed    Diet - low sodium heart healthy   Complete by: As directed    Discharge instructions   Complete by: As directed    After DISE Instructions:  1. Limited activity day of procedure 2. Liquid and soft diet, advance as tolerated 3. May bathe and shower 4. Continue prescribed CPAP 5. May drive 24 hrs after procedure  Call [facility] if any questions or concerns: [phone]   Increase activity slowly   Complete by: As directed    Increase activity slowly   Complete by: As directed       Follow-up Information    Jerrell Belfast,  MD In 2 weeks.   Specialty: Otolaryngology Contact information: [address] [place] [address] [phone]            Signed: Jerrell Belfast 03/10/2019, 9:27 AM

## 2019-03-13 ENCOUNTER — Encounter (HOSPITAL_COMMUNITY): Payer: Self-pay | Admitting: Otolaryngology

## 2019-04-11 ENCOUNTER — Other Ambulatory Visit: Payer: Self-pay

## 2019-04-11 ENCOUNTER — Ambulatory Visit (INDEPENDENT_AMBULATORY_CARE_PROVIDER_SITE_OTHER): Payer: Self-pay | Admitting: Neurology

## 2019-04-11 DIAGNOSIS — Z4549 Encounter for adjustment and management of other implanted nervous system device: Secondary | ICD-10-CM

## 2019-04-11 DIAGNOSIS — G4733 Obstructive sleep apnea (adult) (pediatric): Secondary | ICD-10-CM

## 2019-04-11 DIAGNOSIS — Z0289 Encounter for other administrative examinations: Secondary | ICD-10-CM

## 2019-04-19 ENCOUNTER — Other Ambulatory Visit: Payer: Self-pay | Admitting: Internal Medicine

## 2019-04-19 DIAGNOSIS — E785 Hyperlipidemia, unspecified: Secondary | ICD-10-CM

## 2019-05-01 ENCOUNTER — Ambulatory Visit
Admission: RE | Admit: 2019-05-01 | Discharge: 2019-05-01 | Disposition: A | Discharge status: Home / Self Care | Payer: No Typology Code available for payment source | Source: Ambulatory Visit | Attending: Internal Medicine | Admitting: Internal Medicine

## 2019-05-01 DIAGNOSIS — E785 Hyperlipidemia, unspecified: Secondary | ICD-10-CM

## 2019-05-24 ENCOUNTER — Ambulatory Visit: Payer: Medicare Other | Admitting: Cardiovascular Disease

## 2019-05-24 ENCOUNTER — Other Ambulatory Visit: Payer: Self-pay

## 2019-05-24 ENCOUNTER — Encounter: Payer: Self-pay | Admitting: Cardiovascular Disease

## 2019-05-24 ENCOUNTER — Encounter: Payer: Self-pay | Admitting: *Deleted

## 2019-05-24 VITALS — BP 112/72 | HR 48 | Ht 60.5 in | Wt 113.0 lb

## 2019-05-24 DIAGNOSIS — I701 Atherosclerosis of renal artery: Secondary | ICD-10-CM | POA: Diagnosis not present

## 2019-05-24 DIAGNOSIS — I10 Essential (primary) hypertension: Secondary | ICD-10-CM

## 2019-05-24 DIAGNOSIS — Z789 Other specified health status: Secondary | ICD-10-CM

## 2019-05-24 DIAGNOSIS — R0789 Other chest pain: Secondary | ICD-10-CM

## 2019-05-24 DIAGNOSIS — R55 Syncope and collapse: Secondary | ICD-10-CM | POA: Diagnosis not present

## 2019-05-24 DIAGNOSIS — R001 Bradycardia, unspecified: Secondary | ICD-10-CM

## 2019-05-24 NOTE — Assessment & Plan Note (Signed)
History of atypical chest pain with negative stress test in the past back in 2015 and recent coronary calcium score of 0.

## 2019-05-24 NOTE — Assessment & Plan Note (Signed)
History of essential hypertension with labile hypertension intolerant to multiple medications currently on Bystolic, Avapro and chlorthalidone.  She has been on amlodipine in the past which has caused swelling as well as hydralazine which was discontinued for unclear reasons.  She says she gets episodic spikes in blood pressure during which time she becomes incapacitated.

## 2019-05-24 NOTE — Patient Instructions (Signed)
Medication Instructions:  Your physician recommends that you continue on your current medications as directed. Please refer to the Current Medication list given to you today.  If you need a refill on your cardiac medications before your next appointment, please call your pharmacy.   Lab work: NONE  Testing/Procedures: Your physician has requested that you have an echocardiogram. Echocardiography is a painless test that uses sound waves to create images of your heart. It provides your doctor with information about the size and shape of your heart and how well your heart's chambers and valves are working. This procedure takes approximately one hour. There are no restrictions for this procedure. [place] physician has requested that you have a renal artery duplex. During this test, an ultrasound is used to evaluate blood flow to the kidneys. Allow one hour for this exam. Do not eat after midnight the day before and avoid carbonated beverages. Take your medications as you usually do.  AND  Your physician has recommended that you wear an event monitor. Event monitors are medical devices that record the heart's electrical activity. Doctors most often [name] these monitors to diagnose arrhythmias. Arrhythmias are problems with the speed or rhythm of the heartbeat. The monitor is a small, portable device. You can wear one while you do your normal daily activities. This is usually used to diagnose what is causing palpitations/syncope (passing out).   Follow-Up: At [facility], you and your health needs are our priority.  As part of our continuing mission to provide you with exceptional heart care, we have created designated Provider Care Teams.  These Care Teams include your primary Cardiologist (physician) and Advanced Practice Providers (APPs -  Physician Assistants and Nurse Practitioners) who all work together to provide you with the care you need, when you need  it. You may see Dr Gwenlyn Found or one of the following Advanced Practice Providers on your designated Care Team:    Kerin Ransom, PA-C  [place], [place]  Coletta Memos, [place]  Your physician wants you to follow-up in: 1 month  Any Other Special Instructions Will Be Listed Below (If Applicable).  [employer] Cardiac Event Monitor Instructions Your physician has requested you wear your cardiac event monitor for 30 days, (1-30). [employer] may call or text to confirm a shipping address. The monitor will be sent to a land address via UPS. [employer] will not ship a monitor to a PO BOX. It typically takes 3-5 days to receive your monitor after it has been enrolled. [employer] will assist with USPS tracking if your package is delayed. The telephone number for [employer] is [phone]. Once you have received your monitor, please review the enclosed instructions. Instruction tutorials can also be viewed under help and settings on the enclosed cell phone. Your monitor has already been registered assigning a specific monitor serial # to you.  Applying the monitor Remove cell phone from case and turn it on. The cell phone works as Dealer and needs to be within [employer] of you at all times. The cell phone will need to be charged on a daily basis. We recommend you plug the cell phone into the enclosed charger at your bedside table every night.  Monitor batteries: You will receive two monitor batteries labelled #1 and #2. These are your recorders. Plug battery #2 onto the second connection on the enclosed charger. Keep one battery on the charger at all times. This will keep the monitor battery deactivated. It  will also keep it fully charged for when you need to switch your monitor batteries. A small light will be blinking on the battery emblem when it is charging. The light on the battery emblem will remain on when the battery is fully charged.  Open package of a Monitor strip. Insert  battery #1 into black hood on strip and gently squeeze monitor battery onto connection as indicated in instruction booklet. Set aside while preparing skin.  Choose location for your strip, vertical or horizontal, as indicated in the instruction booklet. Shave to remove all hair from location. There cannot be any lotions, oils, powders, or colognes on skin where monitor is to be applied. Wipe skin clean with enclosed Saline wipe. Dry skin completely.  Peel paper labeled #1 off the back of the Monitor strip exposing the adhesive. Place the monitor on the chest in the vertical or horizontal position shown in the instruction booklet. One arrow on the monitor strip must be pointing upward. Carefully remove paper labeled #2, attaching remainder of strip to your skin. Try not to create any folds or wrinkles in the strip as you apply it.  Firmly press and release the circle in the center of the monitor battery. You will hear a small beep. This is turning the monitor battery on. The heart emblem on the monitor battery will light up every 5 seconds if the monitor battery in turned on and connected to the patient securely. Do not push and hold the circle down as this turns the monitor battery off. The cell phone will locate the monitor battery. A screen will appear on the cell phone checking the connection of your monitor strip. This may read poor connection initially but change to good connection within the next minute. Once your monitor accepts the connection you will hear a series of 3 beeps followed by a climbing crescendo of beeps. A screen will appear on the cell phone showing the two monitor strip placement options. Touch the picture that demonstrates where you applied the monitor strip.  Your monitor strip and battery are waterproof. You are able to shower, bathe, or swim with the monitor on. They just ask you do not submerge deeper than 3 feet underwater. We recommend removing the monitor if you  are swimming in a lake, river, or ocean.  Your monitor battery will need to be switched to a fully charged monitor battery approximately once a week. The cell phone will alert you of an action which needs to be made.  On the cell phone, tap for details to reveal connection status, monitor battery status, and cell phone battery status. The green dots indicates your monitor is in good status. A red dot indicates there is something that needs your attention.  To record a symptom, click the circle on the monitor battery. In 30-60 seconds a list of symptoms will appear on the cell phone. Select your symptom and tap save. Your monitor will record a sustained or significant arrhythmia regardless of you clicking the button. Some patients do not feel the heart rhythm irregularities. [employer] will notify us of any serious or critical events.  Refer to instruction booklet for instructions on switching batteries, changing strips, the Do not disturb or Pause features, or any additional questions.  Call [employer] at [phone], to confirm your monitor is transmitting and record your baseline. They will answer any questions you may have regarding the monitor instructions at that time.  Returning the monitor to Deal all equipment back into blue  box. Peel off strip of paper to expose adhesive and close box securely. There is a prepaid UPS shipping label on this box. Drop in a UPS drop box, or at a UPS facility like [employer]. You may also contact [employer] to arrange UPS to pick up monitor package at your home.

## 2019-05-24 NOTE — Assessment & Plan Note (Signed)
Elizabeth [name] was referred to me by Dr. Joylene Draft for evaluation of bradycardia.  She has been evaluated by Dr. Rayann Heman in the past for presyncope.  She had negative Myoview at that time and a recent coronary calcium score of 0.  She is on Bystolic.  She has hypertension difficult to control intolerant to multiple medication classes.  She is on Bystolic.  Her heart rate runs in the 40s.  She is hypothyroid on Synthroid replacement followed by her primary care physician.  Her major complaint is of fatigue.  She gets occasional episodes of dizziness as well.  I am pending a 2D echocardiogram and 30-day event monitor to further evaluate.  She may ultimately require insertion of a permanent transvenous pacemaker.

## 2019-05-24 NOTE — Assessment & Plan Note (Signed)
History of hyperlipidemia on statin therapy with lipid profile performed 04/05/2019 revealing total cholesterol 182, LDL of 96 and HDL 40.

## 2019-05-24 NOTE — Progress Notes (Signed)
05/24/2019 Elizabeth [name]   06-06-1946  [id number]  Primary Physician Elizabeth Infante, MD Primary Cardiologist: Elizabeth Harp MD Elizabeth [name], [place]  HPI:  Elizabeth [name] is a 73 y.o. thin and fit appearing married Caucasian female mother of 2, grandmother of 7 grandchildren who worked last at [employer] and [employer].  She was referred by her PCP, Elizabeth [name] for cardiovascular valuation because of bradycardia.  She has seen Elizabeth [name] in the past 08/20/2011 for palpitations.  Her cardiac risk factor profile is notable for treated hypertension hyperlipidemia.  Her father did die of a myocardial infarction at age 76 and her mother had bypass surgery and valve replacement.  History of bypass surgery as well.  She had a negative Myoview stress test back in 2015 and a recent coronary calcium score of 0.  She does complain of some shortness of breath but denies chest pain.  She has been on multiple antihypertensive medications but is intolerant to calcium channel blockers causing peripheral edema.  She has been on hydralazine as well.  Currently she is on Bystolic, Avapro and chlorthalidone.  Her heart rate runs in the 40s.  She complains of chronic fatigue.  She also has episodes of presyncope.   Current Meds  Medication Sig  . acetaminophen (TYLENOL) 500 MG tablet Take 500 mg by mouth every 6 (six) hours as needed (for pain.).   Marland Kitchen atorvastatin (LIPITOR) 40 MG tablet TAKE ONE TABLET BY MOUTH ONE TIME DAILY  (Patient taking differently: Take 40 mg by mouth daily. )  . budesonide-formoterol (SYMBICORT) 160-4.5 MCG/ACT inhaler Inhale 2 puffs into the lungs 2 (two) times daily as needed (respiratory issues.).   Marland Kitchen BYSTOLIC 5 MG tablet TAKE 1 TABLET EVERY DAY (Patient taking differently: Take 5 mg by mouth daily. )  . chlorthalidone (HYGROTON) 25 MG tablet Take 25 mg by mouth daily.  . ergocalciferol (VITAMIN D2) 50000 UNITS capsule Take 50,000 Units by mouth every Saturday.   . fluticasone (FLONASE) 50  MCG/ACT nasal spray Place 1-2 sprays into both nostrils daily as needed for allergies or rhinitis.  Marland Kitchen irbesartan (AVAPRO) 300 MG tablet Take 300 mg by mouth daily.  Marland Kitchen levothyroxine (SYNTHROID, LEVOTHROID) 100 MCG tablet TAKE 1 TABLET BY MOUTH ONCE DAILY (Patient taking differently: Take 100 mcg by mouth daily before breakfast. )  . LORazepam (ATIVAN) 0.5 MG tablet Take 0.5 mg by mouth 2 (two) times daily as needed (anxiety).   . meclizine (ANTIVERT) 25 MG tablet Take 1 tablet (25 mg total) by mouth 3 (three) times daily as needed for dizziness.  . nitroGLYCERIN (NITROSTAT) 0.4 MG SL tablet Place 1 tablet (0.4 mg total) under the tongue every 5 (five) minutes as needed for chest pain.  Marland Kitchen omeprazole (PRILOSEC) 20 MG capsule Take 20 mg by mouth daily before breakfast.  . ondansetron (ZOFRAN ODT) 4 MG disintegrating tablet Take 1 tablet (4 mg total) by mouth every 8 (eight) hours as needed.  Marland Kitchen Respiratory Therapy Supplies (FLUTTER) DEVI Use as directed     Allergies  Allergen Reactions  . Codeine Nausea And Vomiting  . Pollen Extract Other (See Comments)    Stuffy nose    Social History   Socioeconomic History  . Marital status: Married    Spouse name: Not on file  . Number of children: Not on file  . Years of education: Not on file  . Highest education level: Not on file  Occupational History  . Not on file  Tobacco Use  . Smoking status: Never Smoker  . Smokeless tobacco: Never Used  Substance and Sexual Activity  . Alcohol use: Yes    Comment: rare wine  . Drug use: No  . Sexual activity: Not on file  Other Topics Concern  . Not on file  Social History Narrative   Pt lives in [place] with spouse.  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   Social Determinants of Health   Financial Resource Strain:   . Difficulty of Paying Living Expenses: Not on file  Food Insecurity:   . Worried About Charity fundraiser in the Last Year: Not on file  . Ran Out of Food in the Last  Year: Not on file  Transportation Needs:   . Lack of Transportation (Medical): Not on file  . Lack of Transportation (Non-Medical): Not on file  Physical Activity:   . Days of Exercise per Week: Not on file  . Minutes of Exercise per Session: Not on file  Stress:   . Feeling of Stress : Not on file  Social Connections:   . Frequency of Communication with Friends and Family: Not on file  . Frequency of Social Gatherings with Friends and Family: Not on file  . Attends Religious Services: Not on file  . Active Member of Clubs or Organizations: Not on file  . Attends Archivist Meetings: Not on file  . Marital Status: Not on file  Intimate Partner Violence:   . Fear of Current or Ex-Partner: Not on file  . Emotionally Abused: Not on file  . Physically Abused: Not on file  . Sexually Abused: Not on file     Review of Systems: General: negative for chills, fever, night sweats or weight changes.  Cardiovascular: negative for chest pain, dyspnea on exertion, edema, orthopnea, palpitations, paroxysmal nocturnal dyspnea or shortness of breath Dermatological: negative for rash Respiratory: negative for cough or wheezing Urologic: negative for hematuria Abdominal: negative for nausea, vomiting, diarrhea, bright [name] blood per rectum, melena, or hematemesis Neurologic: negative for visual changes, syncope, or dizziness All other systems reviewed and are otherwise negative except as noted above.    Blood pressure 112/72, pulse (!) 48, height 5' 0.5" (1.537 m), weight 113 lb (51.3 kg).  General appearance: alert and no distress Neck: no adenopathy, no carotid bruit, no JVD, supple, symmetrical, trachea midline and thyroid not enlarged, symmetric, no tenderness/mass/nodules Lungs: clear to auscultation bilaterally Heart: regular rate and rhythm, S1, S2 normal, no murmur, click, rub or gallop Extremities: extremities normal, atraumatic, no cyanosis or edema Pulses: 2+ and symmetric  Skin: Skin color, texture, turgor normal. No rashes or lesions Neurologic: Alert and oriented X 3, normal strength and tone. Normal symmetric reflexes. Normal coordination and gait  EKG sinus bradycardia at 48 without ST or T wave changes.  There were septal Q waves.  I personally reviewed this EKG.  ASSESSMENT AND PLAN:   HYPERLIPIDEMIA History of hyperlipidemia on statin therapy with lipid profile performed 04/05/2019 revealing total cholesterol 182, LDL of 96 and HDL 40.  Essential hypertension History of essential hypertension with labile hypertension intolerant to multiple medications currently on Bystolic, Avapro and chlorthalidone.  She has been on amlodipine in the past which has caused swelling as well as hydralazine which was discontinued for unclear reasons.  She says she gets episodic spikes in blood pressure during which time she becomes incapacitated.  Atypical chest pain History of atypical chest pain with negative stress test in the past back  in 2015 and recent coronary calcium score of 0.  CPAP ventilation treatment not tolerated History of obstructive sleep apnea recently surgically treated and improved  Slow heart rate Ms. Sereno was referred to me by Elizabeth [name] for evaluation of bradycardia.  She has been evaluated by Elizabeth [name] in the past for presyncope.  She had negative Myoview at that time and a recent coronary calcium score of 0.  She is on Bystolic.  She has hypertension difficult to control intolerant to multiple medication classes.  She is on Bystolic.  Her heart rate runs in the 40s.  She is hypothyroid on Synthroid replacement followed by her primary care physician.  Her major complaint is of fatigue.  She gets occasional episodes of dizziness as well.  I am pending a 2D echocardiogram and 30-day event monitor to further evaluate.  She may ultimately require insertion of a permanent transvenous pacemaker.      Elizabeth Harp MD FACP,FACC,FAHA, [facility]  05/24/2019 1:58 PM

## 2019-05-24 NOTE — Assessment & Plan Note (Signed)
History of obstructive sleep apnea recently surgically treated and improved

## 2019-05-24 NOTE — Progress Notes (Signed)
Patient ID: Elizabeth [name], female   DOB: August 14, 1945, 73 y.o.   MRN: [id number] [employer] to ship a 30 day cardiac event monitor to the patients home.

## 2019-05-29 ENCOUNTER — Ambulatory Visit (HOSPITAL_COMMUNITY)
Admission: RE | Admit: 2019-05-29 | Discharge: 2019-05-29 | Disposition: A | Discharge status: Home / Self Care | Payer: Medicare Other | Source: Ambulatory Visit | Attending: Cardiovascular Disease | Admitting: Cardiovascular Disease

## 2019-05-29 ENCOUNTER — Encounter (INDEPENDENT_AMBULATORY_CARE_PROVIDER_SITE_OTHER): Payer: Medicare Other

## 2019-05-29 ENCOUNTER — Other Ambulatory Visit: Payer: Self-pay

## 2019-05-29 DIAGNOSIS — R55 Syncope and collapse: Secondary | ICD-10-CM

## 2019-05-29 DIAGNOSIS — I701 Atherosclerosis of renal artery: Secondary | ICD-10-CM | POA: Diagnosis present

## 2019-05-29 DIAGNOSIS — I10 Essential (primary) hypertension: Secondary | ICD-10-CM

## 2019-06-04 ENCOUNTER — Ambulatory Visit (HOSPITAL_COMMUNITY): Payer: Medicare Other | Attending: Cardiovascular Disease

## 2019-06-04 ENCOUNTER — Other Ambulatory Visit: Payer: Self-pay

## 2019-06-04 DIAGNOSIS — I701 Atherosclerosis of renal artery: Secondary | ICD-10-CM

## 2019-06-04 DIAGNOSIS — I10 Essential (primary) hypertension: Secondary | ICD-10-CM | POA: Insufficient documentation

## 2019-06-04 DIAGNOSIS — R55 Syncope and collapse: Secondary | ICD-10-CM | POA: Diagnosis present

## 2019-06-29 NOTE — Progress Notes (Signed)
Mrs. Renezmee Climer arrived for an inspire stimulation procedure.  Her device has the model #1328, air [id number] therapy report shows stimulation settings from initially 0 V were increased to 1 V.  Patient controlled was initiated with a range of power between 1 and 2 V.  Pulse width and microseconds remained at 19 s.  Rate and hertz remained at 33/min.  We adjusted the start delay from 30 to 60 minutes as the patient declared a long sleep latency.  The pulse time was increased from 15 minutes to 30 minutes the therapy duration remained at 8 hours nocturnally.  Sensor settings remained unchanged exhalation between -4 and -1 inhalation between 0 and +1 off.  Between 38 and 13 maximum stimulation time remained at 4 seconds duration.    We measured the patient's threshold by visible tongue protrusion in response to the inspire stimulation.  Sensation was reported at 0.9 V and  functional tongue protrusion was witnessed at 1.2 V.  [name], MD

## 2019-07-10 ENCOUNTER — Ambulatory Visit: Payer: Medicare Other | Admitting: Cardiovascular Disease

## 2019-07-10 ENCOUNTER — Encounter: Payer: Self-pay | Admitting: Cardiovascular Disease

## 2019-07-10 ENCOUNTER — Other Ambulatory Visit: Payer: Self-pay

## 2019-07-10 VITALS — BP 108/60 | HR 55 | Temp 96.1°F | Ht 60.5 in | Wt 115.2 lb

## 2019-07-10 DIAGNOSIS — R001 Bradycardia, unspecified: Secondary | ICD-10-CM | POA: Diagnosis not present

## 2019-07-10 MED ORDER — NEBIVOLOL HCL 2.5 MG PO TABS: 2.5000 mg | ORAL_TABLET | Freq: Every day | ORAL | 3 refills | Status: DC

## 2019-07-10 NOTE — Patient Instructions (Signed)
Medication Instructions:  Decrease Bystolic to 2.5mg  Daily  If you need a refill on your cardiac medications before your next appointment, please call your pharmacy.   Lab work: NONE  Testing/Procedures: NONE  Follow-Up: At [facility], you and your health needs are our priority.  As part of our continuing mission to provide you with exceptional heart care, we have created designated Provider Care Teams.  These Care Teams include your primary Cardiologist (physician) and Advanced Practice Providers (APPs -  Physician Assistants and Nurse Practitioners) who all work together to provide you with the care you need, when you need it. You may see Dr. Gwenlyn Found or one of the following Advanced Practice Providers on your designated Care Team:    Kerin Ransom, PA-C  [place], [place]  Coletta Memos, [place]  Your physician wants you to follow-up in: 1 month with Erasmo Downer in [facility] to review Blood Pressures Your physician wants you to follow-up in: 3 months with Dr. Gwenlyn Found    Any Other Special Instructions Will Be Listed Below (If Applicable). Keep a daily blood pressure log and bring it to your appointment with Erasmo Downer in 1 month.

## 2019-07-10 NOTE — Progress Notes (Signed)
[name] returns today for follow-up.  Her 2D echo was essentially normal.  Her renal Doppler showed no evidence of renal artery stenosis.  Event monitor showed sinus rhythm/sinus bradycardia with rates in the 40s.  Her complaints are of lack of energy and fatigue.  I have asked her to cut her Bystolic down from 5 mg a day to 2.5 mg a day.  She will keep a blood a blood pressure log for next 30 days.  Have her come back to see Cyril Mourning in 4 weeks to review make appropriate changes.  I will see her back in 3 months  Lorretta Harp, M.D., [employer], [facility], [place], [place] [address]. [place], [place]  [address]  [phone] 07/10/2019 2:40 PM

## 2019-07-11 ENCOUNTER — Other Ambulatory Visit: Payer: Self-pay | Admitting: Neurology

## 2019-07-11 ENCOUNTER — Telehealth: Payer: Self-pay

## 2019-07-11 DIAGNOSIS — G4733 Obstructive sleep apnea (adult) (pediatric): Secondary | ICD-10-CM

## 2019-07-11 NOTE — Telephone Encounter (Signed)
Sleep study order is placed.

## 2019-07-11 NOTE — Telephone Encounter (Signed)
I need an order for a NPSG for this patient is having an Inspire titration study for 07/23/2019

## 2019-07-13 NOTE — Progress Notes (Signed)
[facility] 1/29

## 2019-07-23 ENCOUNTER — Ambulatory Visit (INDEPENDENT_AMBULATORY_CARE_PROVIDER_SITE_OTHER): Payer: Medicare Other | Admitting: Neurology

## 2019-07-23 DIAGNOSIS — Z789 Other specified health status: Secondary | ICD-10-CM

## 2019-07-23 DIAGNOSIS — Z0289 Encounter for other administrative examinations: Secondary | ICD-10-CM

## 2019-07-23 DIAGNOSIS — G4733 Obstructive sleep apnea (adult) (pediatric): Secondary | ICD-10-CM | POA: Diagnosis not present

## 2019-07-23 DIAGNOSIS — G4730 Sleep apnea, unspecified: Secondary | ICD-10-CM

## 2019-07-24 ENCOUNTER — Telehealth: Payer: Self-pay | Admitting: Neurology

## 2019-07-24 NOTE — Telephone Encounter (Signed)
Called the patient to advise we need to schedule a follow up apt around a month.  **If patient calls back, please offer a follow up visit for the week or march 8th and offer 11:30 am slot that I will have to create for the patient.

## 2019-07-27 ENCOUNTER — Ambulatory Visit: Payer: Medicare Other | Attending: Internal Medicine

## 2019-07-27 DIAGNOSIS — Z23 Encounter for immunization: Secondary | ICD-10-CM | POA: Insufficient documentation

## 2019-07-27 NOTE — Progress Notes (Signed)
   Covid-19 Vaccination Clinic  Name:  Elizabeth Walker    MRN: [id number] DOB: 21-Mar-1946  07/27/2019  Elizabeth Walker was observed post Covid-19 immunization for 15 minutes without incidence. She was provided with Vaccine Information Sheet and instruction to access the V-Safe system.   Elizabeth Walker was instructed to call 911 with any severe reactions post vaccine: Marland Kitchen Difficulty breathing  . Swelling of your face and throat  . A fast heartbeat  . A bad rash all over your body  . Dizziness and weakness    Immunizations Administered    Name Date Dose VIS Date Route   Pfizer COVID-19 Vaccine 07/27/2019  5:40 PM 0.3 mL 05/25/2019 Intramuscular   Manufacturer: [place]   Lot: [id number]   [place]: [id number]

## 2019-08-05 NOTE — Progress Notes (Signed)
Patient ID: Elizabeth Walker                 DOB: 23-Apr-1946                      MRN: [id number]     HPI: Elizabeth Walker is a 74 y.o. female referred by Dr. Gwenlyn Found to HTN clinic. At prior appt with Dr. Gwenlyn Found on 07/10/19, Dr. Gwenlyn Found noted that her 2D echo was essentially normal and her renal Doppler showed no evidence of renal artery stenosis.  Event monitor showed sinus rhythm/sinus bradycardia with rates in the 40s.  She complained of of lack of energy and fatigue.  Her BP at office visit was 108/60 mmHg with a pulse of 55 bpm. Dr. Gwenlyn Found instructed her to decrease her Bystolic from 5 mg a day to 2.5 mg a day.  PMH Medication(s)  HTN Irbesartan, Chlorthalidone, Bystolic  HLD Atorvastatin  OSA CPAP  Angina Nitroglycerin  Allergic rhinitis Flonase  Asthma Symbicort  Hypothyroidism Levothyroxine  Chronic headaches APAP  Anxiety Lorazepam  GERD Omeprazole  Osteoporosis Vitamin D, teriparatide   Patient presents today for initial appt with HTN clinic. Denies NSAIDs/pseudeophedrine use. She takes her BP meds by 9-10AM. She experiences vertigo that has lasted 2-3x week. Her PCP, Dr. Joylene Draft, has prescribed her meclizine, which patient reports has been helpful. Her EET physician, Dr. Wilburn Cornelia, recommended exercises to relieve vertigo, which patient reports have been successful. She reports she had a surgery (Inspire implant) so she does not need to wear CPAP for OSA any longer (performed by Dr. Wilburn Cornelia). Dr. Wilburn Cornelia works for [employer], but is located in [place], [place]. Denies chest pain/SOB/headaches. Patient reports she has noticed her HR has improved (used to be in the 40s consistently) from decreasing Bystolic from 5 mg daily to 2.5 mg daily. Patient monitors BP daily. Patient does not monitor her BP at the same time consistently. She takes BP randomly throughout the day when she remembers. She states there have been times when she monitors BP after performing chores around the house when she exerts  herself.  Current HTN meds: irbesartan 300 mg daily, chlorthalidone 25 mg daily, nebivolol 2.5 mg daily Previously tried: losartan (switch to Bystolic by Dr. Melvyn Novas due to upper airway cough syndrome), HCTZ (tachypalpitations) BP goal: <130/80 mmHg  Family History: father (CAD); mother (Sjgren's syndrome; HTN); brother (died from CAD/CKD (age of onset 33)); sister (CAD)  Social History: no alcohol or tobacco use  Diet: 2 meals per day Brunch: smoothie, english muffin and egg Dinner: meat with 2 vegetables Snacks: rarely (likes Chee[employer] its / cheetos when she snacks) Drinks: water (denies coffee/soda intake) Fried food/fast food: 2-3x per month Canned vegetables: not often   Exercise: none   Home BP readings:  Patient record BP readings and provided BP log to clinic. BP ranges between 110-159 / 60 - 84 mmHg. Her pulse trends between 53 - 74 bpm.    Wt Readings from Last 3 Encounters:  07/10/19 115 lb 3.2 oz (52.3 kg)  05/24/19 113 lb (51.3 kg)  03/09/19 110 lb 7.2 oz (50.1 kg)   BP Readings from Last 3 Encounters:  07/10/19 108/60  05/24/19 112/72  03/10/19 (!) 118/51   Pulse Readings from Last 3 Encounters:  07/10/19 (!) 55  05/24/19 (!) 48  03/10/19 64    Renal function: CrCl cannot be calculated (Patient's most recent lab result is older than the maximum 21 days allowed.).  Past Medical History:  Diagnosis Date   Anxiety    Arthritis    Depression    Esophageal spasm    a. pt reports prior GI study demonstrating this.   GERD (gastroesophageal reflux disease)    Hypercholesteremia    Hypertension    Hypothyroid    Migraine    Osteoporosis    Pneumonia    PONV (postoperative nausea and vomiting)    Sinusitis    Sleep apnea    a. intolerant to CPAP.   Tachycardia    a. suspected due to sinus tach. Event monitor/echo unremarkable.    Current Outpatient Medications on File Prior to Visit  Medication Sig Dispense Refill   acetaminophen  (TYLENOL) 500 MG tablet Take 500 mg by mouth every 6 (six) hours as needed (for pain.).      atorvastatin (LIPITOR) 40 MG tablet TAKE ONE TABLET BY MOUTH ONE TIME DAILY  (Patient taking differently: Take 40 mg by mouth daily. ) 90 tablet 0   budesonide-formoterol (SYMBICORT) 160-4.5 MCG/ACT inhaler Inhale 2 puffs into the lungs 2 (two) times daily as needed (respiratory issues.).      chlorthalidone (HYGROTON) 25 MG tablet Take 25 mg by mouth daily.     ergocalciferol (VITAMIN D2) 50000 UNITS capsule Take 50,000 Units by mouth every Saturday.      fluticasone (FLONASE) 50 MCG/ACT nasal spray Place 1-2 sprays into both nostrils daily as needed for allergies or rhinitis.     irbesartan (AVAPRO) 300 MG tablet Take 300 mg by mouth daily.     levothyroxine (SYNTHROID, LEVOTHROID) 100 MCG tablet TAKE 1 TABLET BY MOUTH ONCE DAILY (Patient taking differently: Take 100 mcg by mouth daily before breakfast. ) 30 tablet 0   LORazepam (ATIVAN) 0.5 MG tablet Take 0.5 mg by mouth 2 (two) times daily as needed (anxiety).      meclizine (ANTIVERT) 25 MG tablet Take 1 tablet (25 mg total) by mouth 3 (three) times daily as needed for dizziness. 30 tablet 0   nebivolol (BYSTOLIC) 2.5 MG tablet Take 1 tablet (2.5 mg total) by mouth daily. 90 tablet 3   nitroGLYCERIN (NITROSTAT) 0.4 MG SL tablet Place 1 tablet (0.4 mg total) under the tongue every 5 (five) minutes as needed for chest pain. 20 tablet 0   omeprazole (PRILOSEC) 20 MG capsule Take 20 mg by mouth daily before breakfast.     ondansetron (ZOFRAN ODT) 4 MG disintegrating tablet Take 1 tablet (4 mg total) by mouth every 8 (eight) hours as needed. 10 tablet 0   Probiotic Product (ACIDOPHILUS/GOAT MILK) CAPS Take by mouth.     Respiratory Therapy Supplies (FLUTTER) DEVI Use as directed 1 each 0   Teriparatide, Recombinant, 620 MCG/2.48ML SOPN Samples of this drug were given to the patient, quantity 1, Lot Number [id number]     No current  facility-administered medications on file prior to visit.    Allergies  Allergen Reactions   Codeine Nausea And Vomiting   Pollen Extract Other (See Comments)    Stuffy nose     Assessment/Plan:  1. Hypertension - BP goal < 130/80 mmHg; therefore, it appears patient may be at goal. Patient's office BP reading was at goal and most home BP readings were at goal. However, patient did have a few home BP readings that were elevated. Patient does not monitor her BP at the same time consistently. It is challenging to determine if her BP is uncontrolled at specific times of the day or if elevated BP can be attributed to  increases in exertion. While patient experiences lightheadedness, it is likely her symptoms can be attributed to vertigo rather than hypotension. Plan for patient to take irbesartan 300 mg daily and chlorthalidone 25 mg daily in the morning. Plan for patient to take nebivolol 2.5 mg daily in the evening. Instructed patient to monitor BP readings twice daily 2-3 hours after she administers BP medications. Advised patient to record BP reading, pulse, time/date. Patient verbalized understanding. Follow up appt scheduled for 09/04/19 at 2:00 PM.   Thank you for involving pharmacy to assist in providing this patient's care.   Drexel Iha, PharmD PGY2 Ambulatory Care Pharmacy Resident

## 2019-08-07 ENCOUNTER — Ambulatory Visit (INDEPENDENT_AMBULATORY_CARE_PROVIDER_SITE_OTHER): Payer: Medicare Other | Admitting: Pharmacist

## 2019-08-07 ENCOUNTER — Other Ambulatory Visit: Payer: Self-pay

## 2019-08-07 ENCOUNTER — Encounter: Payer: Self-pay | Admitting: Pharmacist

## 2019-08-07 VITALS — BP 112/68 | HR 60

## 2019-08-07 DIAGNOSIS — I10 Essential (primary) hypertension: Secondary | ICD-10-CM

## 2019-08-07 NOTE — Patient Instructions (Addendum)
It was a pleasure seeing you in clinic today Elizabeth Walker!  Today the plan is... 1. START taking irbesartan 300 mg daily and chlorthalidone 25 mg daily in the morning 2. START taking nebivolol 2.5 mg daily in the evening 3. Take blood pressure reading 2-3 hours after taking blood pressure medications. Please record blood pressure reading, pulse reading, date/time.   Please call the PharmD clinic at [phone] if you have any questions that you would like to speak with a pharmacist about Elizabeth Walker, [place])

## 2019-08-14 ENCOUNTER — Ambulatory Visit: Payer: Self-pay

## 2019-08-14 NOTE — Telephone Encounter (Signed)
Date of "indirect" covid contact: 08/07/19. Pt has no sx of covid. Advised pt to proceed with upcoming vaccination. Asked pt to call and cancel  second covid vaccine if develops any covid sx and not to reschedule until 10- 14 days without  Covid sx. Pt verbalized understanding.   Reason for Disposition . General information question, no triage required and triager able to answer question  Answer Assessment - Initial Assessment Questions 1. REASON FOR CALL or QUESTION: "What is your reason for calling today?" or "How can I best help you?" or "What question do you have that I can help answer?"     Pt had indirect exposure 7 days ago. Pt has no sx and is scheduled for 2nd covid vaccine 08/19/19. Pt want to know if she can get vaccine.  Protocols used: INFORMATION ONLY CALL-A-AH

## 2019-08-19 ENCOUNTER — Ambulatory Visit: Payer: Medicare Other | Attending: Internal Medicine

## 2019-08-19 DIAGNOSIS — Z23 Encounter for immunization: Secondary | ICD-10-CM | POA: Insufficient documentation

## 2019-08-19 NOTE — Progress Notes (Signed)
   Covid-19 Vaccination Clinic  Name:  Elizabeth Walker    MRN: [id number] DOB: Jun 03, 1946  08/19/2019  [name] was observed post Covid-19 immunization for 15 minutes without incident. She was provided with Vaccine Information Sheet and instruction to access the V-Safe system.   [name] was instructed to call 911 with any severe reactions post vaccine: Marland Kitchen Difficulty breathing  . Swelling of face and throat  . A fast heartbeat  . A bad rash all over body  . Dizziness and weakness   Immunizations Administered    Name Date Dose VIS Date Route   Pfizer COVID-19 Vaccine 08/19/2019  2:44 PM 0.3 mL 05/25/2019 Intramuscular   Manufacturer: [place]   Lot: [id number]   [place]: [id number]

## 2019-08-20 ENCOUNTER — Encounter: Payer: Self-pay | Admitting: Neurology

## 2019-08-20 ENCOUNTER — Ambulatory Visit: Payer: Medicare Other | Admitting: Neurology

## 2019-08-20 ENCOUNTER — Other Ambulatory Visit: Payer: Self-pay

## 2019-08-20 ENCOUNTER — Other Ambulatory Visit: Payer: Self-pay | Admitting: Neurology

## 2019-08-20 VITALS — BP 133/62 | HR 50 | Temp 97.6°F | Ht 60.5 in | Wt 116.0 lb

## 2019-08-20 DIAGNOSIS — G4733 Obstructive sleep apnea (adult) (pediatric): Secondary | ICD-10-CM | POA: Diagnosis not present

## 2019-08-20 DIAGNOSIS — J329 Chronic sinusitis, unspecified: Secondary | ICD-10-CM

## 2019-08-20 DIAGNOSIS — F5104 Psychophysiologic insomnia: Secondary | ICD-10-CM

## 2019-08-20 DIAGNOSIS — R001 Bradycardia, unspecified: Secondary | ICD-10-CM | POA: Diagnosis not present

## 2019-08-20 DIAGNOSIS — G4710 Hypersomnia, unspecified: Secondary | ICD-10-CM

## 2019-08-20 DIAGNOSIS — Z4549 Encounter for adjustment and management of other implanted nervous system device: Secondary | ICD-10-CM

## 2019-08-20 DIAGNOSIS — G4719 Other hypersomnia: Secondary | ICD-10-CM

## 2019-08-20 DIAGNOSIS — G4721 Circadian rhythm sleep disorder, delayed sleep phase type: Secondary | ICD-10-CM

## 2019-08-20 NOTE — Patient Instructions (Signed)

## 2019-08-20 NOTE — Procedures (Signed)
PATIENT'S NAME:  Elizabeth Walker, Elizabeth Walker DOB:      [birth year]      MR#:    [id number]     DATE OF RECORDING: 07/23/2019 REFERRING M.D.:  Crist Infante ,MD  Study Performed:   Inspire DEVICE Titration HISTORY:  Pt here for titration of implanted and activated Inspire device. Elizabeth Walker had been placed on BiPAP by an outside sleep center and repeated a Consult and sleep study with [place]. This was meant to be followed by an MSLT due to the endorsed high degree of daytime sleepiness, but Her CPAP alleviated OSA at a pressure of 10 cm water to a residual AHI of 0.3/h. MSLT did only show one naps with sleep onset, no REM sleep. No early REM sleep during PSG either.  She became none the less tired of PAP therapy and was looking for alternatives.   The patient endorsed the Epworth Sleepiness Scale at 17/24 points.    The patient's weight 117 pounds with a height of 60 (inches), resulting in a BMI of 22.9 kg/m2.  The patient's neck circumference measured 15 inches.  CURRENT MEDICATIONS: Tylenol, Lipitor, Bystolic, Pepcid, Avapro, Ativan, Dulera, Nitrostat, Zantac. Patient took Lorazepam prior to this sleep study to advance her bedtime from the usual 3-4 AM.     PROCEDURE:  This is a multichannel digital polysomnogram utilizing the SomnoStar 11.2 system.  Electrodes and sensors were applied and monitored per AASM Specifications.   EEG, EOG, Chin and Limb EMG, were sampled at 200 Hz.  ECG, Snore and Nasal Pressure, Thermal Airflow, Respiratory Effort, CPAP Flow and Pressure, Oximetry was sampled at 50 Hz. Digital video and audio were recorded.       Inspires device was titrated beginning at 1.5 Volt and titrated to 2.0 V.  At 2.O Volt, there was a reduction of the AHI to 0.0 with improvement of sleep apnea. At 1.9V there was still an AHI of 2.7/h noted.   Lights Out was at 22:57 and Lights On at 05:15. Total recording time (TRT) was 349.5 minutes, with a total sleep time (TST) of 334.5 minutes. The patient's  sleep latency was 6 minutes. REM latency was 84.5 minutes.  The sleep efficiency was 95.7 %.    SLEEP ARCHITECTURE: WASO (Wake after sleep onset) was 24 minutes.  There were 41 minutes in Stage N1, 94 minutes Stage N2, 96 minutes Stage N3 and 118.5 minutes in Stage REM.  The percentage of Stage N1 was 11.7%, Stage N2 was 26.9%, Stage N3 was 27.5% and Stage R (REM sleep) was 33.9%.    RESPIRATORY ANALYSIS:  There was a total of 29 respiratory events: 1 obstructive apnea, 0 central apneas and 1 mixed apnea with a total of 2 apneas and an apnea index (AI) of 0.4 /hour. There were 27 hypopneas with a hypopnea index of 4.8/hour. The patient also had additional respiratory event related arousals (RERAs).      The total APNEA/HYPOPNEA INDEX  (AHI) was 5.2 /hour and the total RESPIRATORY DISTURBANCE INDEX was 6.2 /hour  12 events occurred in REM sleep and 17 events in NREM. The REM AHI was 6.1 /hour versus a non-REM AHI of 4.7 /hour.  The patient spent 349.5 minutes of total sleep time in the supine position and 0 minutes in non-supine. The supine AHI was 5.2, versus a non-supine AHI of 0.0.  OXYGEN SATURATION & C02:  The baseline 02 saturation was 89%, with the lowest being 72%. Time spent below 89% saturation equaled 32 minutes.  PERIODIC LIMB MOVEMENTS:  The patient had a total of 6 Periodic Limb Movements. The Periodic Limb Movement (PLM) index was 1.1 and the PLM Arousal index was 0.4 /hour.   Audio and video analysis did not show any abnormal or unusual movements, behaviors, phonations or vocalizations.   Snoring was noted at low inspire voltage  EKG was in keeping with sinus rhythm (NSR) and was at times sinus rhythm bradycardic. Post-study, the patient indicated that sleep was better than usual.    DIAGNOSIS 1. Obstructive Sleep Apnea responding to INSPIRE 2.0 Volt output Power. 2. Hypoxemia was not influenced by Inspire.   3. Slow EKG. 4. No nocturia.  5. High sleep  efficiency.   PLANS/RECOMMENDATIONS: the goes is to reach 2.0 Volt as final power/ pressure in increments.    DISCUSSION: RV with me in sleep clinic. Please attach full technologist report about Inspire titration to the technical data document.   A follow up appointment will be scheduled in the Sleep Clinic at [facility].   Please call [phone] with any questions.      I certify that I have reviewed the entire raw data recording prior to the issuance of this report in accordance with the Standards of Accreditation of the American Academy of Sleep Medicine (AASM)  [name], M.D. Diplomat, Tax adviser of Psychiatry and Neurology  Diplomat, Tax adviser of Sleep Medicine Market researcher, [employer] [facility] at [employer]

## 2019-08-20 NOTE — Progress Notes (Signed)
DIAGNOSIS  1. Obstructive Sleep Apnea responding to INSPIRE 2.0 Volt output  Power.  2. Hypoxemia was not influenced by Inspire.   3. Slow EKG.  4. No nocturia.  5. High sleep efficiency.    PLANS/RECOMMENDATIONS: the goes is to reach 2.0 Volt as final  power/ pressure in increments.    DISCUSSION: RV with me in sleep clinic. Please attach full  technologist report about Inspire titration to the technical data  document.
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
## 2019-09-04 ENCOUNTER — Other Ambulatory Visit: Payer: Self-pay

## 2019-09-04 ENCOUNTER — Ambulatory Visit (INDEPENDENT_AMBULATORY_CARE_PROVIDER_SITE_OTHER): Payer: Medicare Other | Admitting: Pharmacist

## 2019-09-04 VITALS — BP 116/60 | HR 56 | Ht 60.5 in | Wt 114.0 lb

## 2019-09-04 DIAGNOSIS — I10 Essential (primary) hypertension: Secondary | ICD-10-CM

## 2019-09-04 NOTE — Progress Notes (Deleted)
Patient ID: Elizabeth Walker                 DOB: 06-13-1946                      MRN: [id number]     HPI: Elizabeth Walker is a 74 y.o. female referred by Dr. Gwenlyn Found to HTN clinic. At prior appt with Dr. Gwenlyn Found on 07/10/19, Dr. Gwenlyn Found noted that her 2D echo was essentially normal and her renal Doppler showed no evidence of renal artery stenosis.  Event monitor showed sinus rhythm/sinus bradycardia with rates in the 40s.  She complained of of lack of energy and fatigue.  Her BP at office visit was 108/60 mmHg with a pulse of 55 bpm. Dr. Gwenlyn Found instructed her to decrease her Bystolic from 5 mg a day to 2.5 mg a day.  PMH Medication(s)  HTN Irbesartan, Chlorthalidone, Bystolic  HLD Atorvastatin  OSA CPAP  Angina Nitroglycerin  Allergic rhinitis Flonase  Asthma Symbicort  Hypothyroidism Levothyroxine  Chronic headaches APAP  Anxiety Lorazepam  GERD Omeprazole  Osteoporosis Vitamin D, teriparatide   Patient presents today for initial appt with HTN clinic. Denies NSAIDs/pseudeophedrine use. She takes her BP meds by 9-10AM. She experiences vertigo that has lasted 2-3x week. Her PCP, Dr. Joylene Draft, has prescribed her meclizine, which patient reports has been helpful. Her EET physician, Dr. Wilburn Cornelia, recommended exercises to relieve vertigo, which patient reports have been successful. She reports she had a surgery (Inspire implant) so she does not need to wear CPAP for OSA any longer (performed by Dr. Wilburn Cornelia). Dr. Wilburn Cornelia works for [employer], but is located in [place], [place]. Denies chest pain/SOB/headaches. Patient reports she has noticed her HR has improved (used to be in the 40s consistently) from decreasing Bystolic from 5 mg daily to 2.5 mg daily. Patient monitors BP daily. Patient does not monitor her BP at the same time consistently. She takes BP randomly throughout the day when she remembers. She states there have been times when she monitors BP after performing chores around the house when she exerts  herself.  Current HTN meds: irbesartan 300 mg daily chlorthalidone 25 mg daily nebivolol 2.5 mg daily - evening  Previously tried:  losartan (switch to Bystolic by Dr. Melvyn Novas due to upper airway cough syndrome)  HCTZ (tachypalpitations)  BP goal: <130/80 mmHg  Family History: father (CAD); mother (Sjgren's syndrome; HTN); brother (died from CAD/CKD (age of onset 32)); sister (CAD)  Social History: no alcohol or tobacco use  Diet: 2 meals per day Brunch: smoothie, english muffin and egg Dinner: meat with 2 vegetables Snacks: rarely (likes Cheeze its / cheetos when she snacks) Drinks: water (denies coffee/soda intake) Fried food/fast food: 2-3x per month Canned vegetables: not often   Exercise: none   Home BP readings:  Patient record BP readings and provided BP log to clinic. BP ranges between 110-159 / 60 - 84 mmHg. Her pulse trends between 53 - 74 bpm.   Wt Readings from Last 3 Encounters:  09/04/19 114 lb (51.7 kg)  08/20/19 116 lb (52.6 kg)  07/10/19 115 lb 3.2 oz (52.3 kg)   BP Readings from Last 3 Encounters:  09/04/19 116/60  08/20/19 133/62  08/07/19 112/68   Pulse Readings from Last 3 Encounters:  09/04/19 (!) 56  08/20/19 (!) 50  08/07/19 60    Renal function: CrCl cannot be calculated (Patient's most recent lab result is older than the maximum 21 days allowed.).  Past Medical  History:  Diagnosis Date  . Anxiety   . Arthritis   . Depression   . Esophageal spasm    a. pt reports prior GI study demonstrating this.  Marland Kitchen GERD (gastroesophageal reflux disease)   . Hypercholesteremia   . Hypertension   . Hypothyroid   . Migraine   . Osteoporosis   . Pneumonia   . PONV (postoperative nausea and vomiting)   . Sinusitis   . Sleep apnea    a. intolerant to CPAP.  Marland Kitchen Tachycardia    a. suspected due to sinus tach. Event monitor/echo unremarkable.    Current Outpatient Medications on File Prior to Visit  Medication Sig Dispense Refill  . acetaminophen  (TYLENOL) 500 MG tablet Take 500 mg by mouth every 6 (six) hours as needed (for pain.).     Marland Kitchen albuterol (VENTOLIN HFA) 108 (90 Base) MCG/ACT inhaler Inhale 1 puff into the lungs as needed.    Marland Kitchen atorvastatin (LIPITOR) 40 MG tablet TAKE ONE TABLET BY MOUTH ONE TIME DAILY  (Patient taking differently: Take 40 mg by mouth daily. ) 90 tablet 0  . budesonide-formoterol (SYMBICORT) 160-4.5 MCG/ACT inhaler Inhale 2 puffs into the lungs 2 (two) times daily as needed (respiratory issues.).     Marland Kitchen cefdinir (OMNICEF) 300 MG capsule Take 300 mg by mouth 2 (two) times daily.    . chlorthalidone (HYGROTON) 25 MG tablet Take 25 mg by mouth every morning.     . denosumab (PROLIA) 60 MG/ML SOSY injection Inject 60 mg into the skin every 6 (six) months.    . ergocalciferol (VITAMIN D2) 50000 UNITS capsule Take 50,000 Units by mouth every Saturday.     . fluticasone (FLONASE) 50 MCG/ACT nasal spray Place 1-2 sprays into both nostrils daily as needed for allergies or rhinitis.    Marland Kitchen irbesartan (AVAPRO) 300 MG tablet Take 300 mg by mouth every morning.     Marland Kitchen levothyroxine (SYNTHROID, LEVOTHROID) 100 MCG tablet TAKE 1 TABLET BY MOUTH ONCE DAILY (Patient taking differently: Take 100 mcg by mouth daily before breakfast. ) 30 tablet 0  . LORazepam (ATIVAN) 0.5 MG tablet Take 0.5 mg by mouth 2 (two) times daily as needed (anxiety).     . meclizine (ANTIVERT) 25 MG tablet Take 1 tablet (25 mg total) by mouth 3 (three) times daily as needed for dizziness. 30 tablet 0  . nebivolol (BYSTOLIC) 2.5 MG tablet Take 1 tablet (2.5 mg total) by mouth daily. (Patient taking differently: Take 2.5 mg by mouth every evening. ) 90 tablet 3  . omeprazole (PRILOSEC) 20 MG capsule Take 20 mg by mouth daily before breakfast.    . ondansetron (ZOFRAN ODT) 4 MG disintegrating tablet Take 1 tablet (4 mg total) by mouth every 8 (eight) hours as needed. 10 tablet 0  . Probiotic Product (ACIDOPHILUS/GOAT MILK) CAPS Take by mouth.    . Respiratory  Therapy Supplies (FLUTTER) DEVI Use as directed 1 each 0   No current facility-administered medications on file prior to visit.    Allergies  Allergen Reactions  . Pollen Extract Other (See Comments)    Stuffy nose  . Codeine Nausea And Vomiting     Assessment/Plan:  1. Hypertension - BP goal < 130/80 mmHg; therefore, it appears patient may be at goal. Patient's office BP reading was at goal and most home BP readings were at goal. However, patient did have a few home BP readings that were elevated. Patient does not monitor her BP at the same time consistently. It  is challenging to determine if her BP is uncontrolled at specific times of the day or if elevated BP can be attributed to increases in exertion. While patient experiences lightheadedness, it is likely her symptoms can be attributed to vertigo rather than hypotension. Plan for patient to take irbesartan 300 mg daily and chlorthalidone 25 mg daily in the morning. Plan for patient to take nebivolol 2.5 mg daily in the evening. Instructed patient to monitor BP readings twice daily 2-3 hours after she administers BP medications. Advised patient to record BP reading, pulse, time/date. Patient verbalized understanding. Follow up appt scheduled for 09/04/19 at 2:00 PM.    Indiyah Paone Rodriguez-Guzman PharmD, BCPS, [place] [id number] 09/04/2019 2:24 PM

## 2019-09-04 NOTE — Patient Instructions (Signed)
Return for a  follow up appointment AS NEEDED  Check your blood pressure at home daily (if able) and keep record of the readings.  Take your BP meds as follows:  * NO MEDICATION CHANGE*  Bring all of your meds, your BP cuff and your record of home blood pressures to your next appointment.  Exercise as you're able, try to walk approximately 30 minutes per day.  Keep salt intake to a minimum, especially watch canned and prepared boxed foods.  Eat more fresh fruits and vegetables and fewer canned items.  Avoid eating in fast food restaurants.    HOW TO TAKE YOUR BLOOD PRESSURE: . Rest 5 minutes before taking your blood pressure. .  Don't smoke or drink caffeinated beverages for at least 30 minutes before. . Take your blood pressure before (not after) you eat. . Sit comfortably with your back supported and both feet on the floor (don't cross your legs). . Elevate your arm to heart level on a table or a desk. . Use the proper sized cuff. It should fit smoothly and snugly around your bare upper arm. There should be enough room to slip a fingertip under the cuff. The bottom edge of the cuff should be 1 inch above the crease of the elbow. . Ideally, take 3 measurements at one sitting and record the average.

## 2019-09-05 ENCOUNTER — Encounter: Payer: Self-pay | Admitting: Pharmacist

## 2019-09-05 NOTE — Assessment & Plan Note (Signed)
Blood pressure remains well control during OV and HR is above 50bpm. Patient reports improvement in dizznes and home heart rate readings as well. Will continue current therapy without changes and follow up as needed. Elizabeth Walker was encouraged to call clinic if BP changes or HR drops under 50 aganin

## 2019-09-05 NOTE — Progress Notes (Signed)
Patient ID: Elizabeth Walker                 DOB: 10-Nov-1945                      MRN: [id number]     HPI: Elizabeth Walker is a 74 y.o. female referred by Dr. Gwenlyn Found to HTN clinic. PMH includes hypertension, OSA, hypothyroidism, asthma, tachycardia, insomnia, and hypercholesterolemia. During last OV with Dr Gwenlyn Found on 07/10/2019 her dose of Bystolic was decreased from 5mg  daily to 2.5mg  daily d/t low HR in the 40's and increased dizziness. We follow up with patient 4 weeks after decreasing Bystolic dose and her blood pressure was well controlled with HR in the 50s during OV. Due to some variability on home BP and persistent dizziness, her Bystolic administration time was changed to evenings to increase tolerability.  Patient presents today for follow up. Reports improvement of her dizziness but reports hx of vertigo as well. Continues feel some fatigue d/t insomnia now managed by neurologist and denies highhandedness, swelling, blurry vision or chest pain.  Current HTN meds:   Intolerance:  Amlodipine - swelling hydralazine  BP goal: <130/80  Family History: father (CAD); mother (Sjgren's syndrome; HTN); brother (died from CAD/CKD (age of onset 46)); sister (CAD)  Social History: denies alcohol or tobacco use  Diet: 2 meals per day Brunch: smoothie, english muffin and egg Dinner: meat with 2 vegetables Snacks: rarely (likes Cheeze its / cheetos when she snacks) Drinks: water (denies coffee/soda intake) Fried food/fast food: 2-3x per month Canned vegetables: not often   Exercise: activities of daily living  Home BP readings: (Home BP cuff NOT calibrated) 22 readings: average 132/74 (range 105/56 to 165/73); HR range 52-65 bpm  Wt Readings from Last 3 Encounters:  09/04/19 114 lb (51.7 kg)  08/20/19 116 lb (52.6 kg)  07/10/19 115 lb 3.2 oz (52.3 kg)   BP Readings from Last 3 Encounters:  09/04/19 116/60  08/20/19 133/62  08/07/19 112/68   Pulse Readings from Last 3 Encounters:    09/04/19 (!) 56  08/20/19 (!) 50  08/07/19 60    Renal function: CrCl cannot be calculated (Patient's most recent lab result is older than the maximum 21 days allowed.).  Past Medical History:  Diagnosis Date  . Anxiety   . Arthritis   . Depression   . Esophageal spasm    a. pt reports prior GI study demonstrating this.  Marland Kitchen GERD (gastroesophageal reflux disease)   . Hypercholesteremia   . Hypertension   . Hypothyroid   . Migraine   . Osteoporosis   . Pneumonia   . PONV (postoperative nausea and vomiting)   . Sinusitis   . Sleep apnea    a. intolerant to CPAP.  Marland Kitchen Tachycardia    a. suspected due to sinus tach. Event monitor/echo unremarkable.    Current Outpatient Medications on File Prior to Visit  Medication Sig Dispense Refill  . acetaminophen (TYLENOL) 500 MG tablet Take 500 mg by mouth every 6 (six) hours as needed (for pain.).     Marland Kitchen albuterol (VENTOLIN HFA) 108 (90 Base) MCG/ACT inhaler Inhale 1 puff into the lungs as needed.    Marland Kitchen atorvastatin (LIPITOR) 40 MG tablet TAKE ONE TABLET BY MOUTH ONE TIME DAILY  (Patient taking differently: Take 40 mg by mouth daily. ) 90 tablet 0  . budesonide-formoterol (SYMBICORT) 160-4.5 MCG/ACT inhaler Inhale 2 puffs into the lungs 2 (two) times daily as needed (respiratory  issues.).     Marland Kitchen cefdinir (OMNICEF) 300 MG capsule Take 300 mg by mouth 2 (two) times daily.    . chlorthalidone (HYGROTON) 25 MG tablet Take 25 mg by mouth every morning.     . denosumab (PROLIA) 60 MG/ML SOSY injection Inject 60 mg into the skin every 6 (six) months.    . ergocalciferol (VITAMIN D2) 50000 UNITS capsule Take 50,000 Units by mouth every Saturday.     . fluticasone (FLONASE) 50 MCG/ACT nasal spray Place 1-2 sprays into both nostrils daily as needed for allergies or rhinitis.    Marland Kitchen irbesartan (AVAPRO) 300 MG tablet Take 300 mg by mouth every morning.     Marland Kitchen levothyroxine (SYNTHROID, LEVOTHROID) 100 MCG tablet TAKE 1 TABLET BY MOUTH ONCE DAILY (Patient  taking differently: Take 100 mcg by mouth daily before breakfast. ) 30 tablet 0  . LORazepam (ATIVAN) 0.5 MG tablet Take 0.5 mg by mouth 2 (two) times daily as needed (anxiety).     . meclizine (ANTIVERT) 25 MG tablet Take 1 tablet (25 mg total) by mouth 3 (three) times daily as needed for dizziness. 30 tablet 0  . nebivolol (BYSTOLIC) 2.5 MG tablet Take 1 tablet (2.5 mg total) by mouth daily. (Patient taking differently: Take 2.5 mg by mouth every evening. ) 90 tablet 3  . omeprazole (PRILOSEC) 20 MG capsule Take 20 mg by mouth daily before breakfast.    . ondansetron (ZOFRAN ODT) 4 MG disintegrating tablet Take 1 tablet (4 mg total) by mouth every 8 (eight) hours as needed. 10 tablet 0  . Probiotic Product (ACIDOPHILUS/GOAT MILK) CAPS Take by mouth.    . Respiratory Therapy Supplies (FLUTTER) DEVI Use as directed 1 each 0   No current facility-administered medications on file prior to visit.    Allergies  Allergen Reactions  . Pollen Extract Other (See Comments)    Stuffy nose  . Codeine Nausea And Vomiting    Blood pressure 116/60, pulse (!) 56, height 5' 0.5" (1.537 m), weight 114 lb (51.7 kg), SpO2 95 %.  Essential hypertension Blood pressure remains well control during OV and HR is above 50bpm. Patient reports improvement in dizznes and home heart rate readings as well. Will continue current therapy without changes and follow up as needed. Ms Mutchler was encouraged to call clinic if BP changes or HR drops under 50 aganin   Lequan Dobratz Rodriguez-Guzman PharmD, BCPS, [place] [address] [place] [address] 09/05/2019 11:52 AM

## 2019-09-21 NOTE — Progress Notes (Deleted)
Office Visit Note  Patient: Elizabeth Walker             Date of Birth: 04-12-1946           MRN: [id number]             PCP: Crist Infante, MD Referring: Crist Infante, MD Visit Date: 09/26/2019 Occupation: @GUAROCC @  Subjective:  No chief complaint on file.   History of Present Illness: Elizabeth Walker is a 74 y.o. female ***   Activities of Daily Living:  Patient reports morning stiffness for *** {minute/hour:19697}.   Patient {ACTIONS;DENIES/REPORTS:21021675::"Denies"} nocturnal pain.  Difficulty dressing/grooming: {ACTIONS;DENIES/REPORTS:21021675::"Denies"} Difficulty climbing stairs: {ACTIONS;DENIES/REPORTS:21021675::"Denies"} Difficulty getting out of chair: {ACTIONS;DENIES/REPORTS:21021675::"Denies"} Difficulty using hands for taps, buttons, cutlery, and/or writing: {ACTIONS;DENIES/REPORTS:21021675::"Denies"}  No Rheumatology ROS completed.   PMFS History:  Patient Active Problem List   Diagnosis Date Noted  . OSA (obstructive sleep apnea) 08/20/2019  . Slow heart rate 05/24/2019  . Obstructive sleep apnea 03/09/2019  . Chronic insomnia 08/29/2017  . 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  . Sleep apnea treated with nocturnal BiPAP 08/29/2017  . Excessive daytime sleepiness 08/29/2017  . Intolerance of continuous positive airway pressure (CPAP) ventilation 08/29/2017  . Sinusitis, chronic 01/02/2015  . Cough variant asthma 06/18/2014  . Dyspnea on exertion 05/13/2014  . Hypercholesteremia   . Atypical chest pain 04/08/2014  . Asthma with acute exacerbation 04/03/2014  . PNA (pneumonia) 04/02/2014  . Hypokalemia 04/02/2014  . CAP (community acquired pneumonia) 04/02/2014  . Hypothyroidism 04/10/2013  . Tachycardia 04/22/2011  . HYPERLIPIDEMIA 12/04/2007  . Essential hypertension 12/04/2007  . ALLERGIC RHINITIS 12/04/2007  . ASTHMA 12/04/2007  . HEADACHE, CHRONIC 12/04/2007  . Cough 12/04/2007    Past Medical History:  Diagnosis Date  . Anxiety   .  Arthritis   . Depression   . Esophageal spasm    a. pt reports prior GI study demonstrating this.  Marland Kitchen GERD (gastroesophageal reflux disease)   . Hypercholesteremia   . Hypertension   . Hypothyroid   . Migraine   . Osteoporosis   . Pneumonia   . PONV (postoperative nausea and vomiting)   . Sinusitis   . Sleep apnea    a. intolerant to CPAP.  Marland Kitchen Tachycardia    a. suspected due to sinus tach. Event monitor/echo unremarkable.    Family History  Problem Relation Age of Onset  . Coronary artery disease Other   . CAD Father        died at 29 - massive heart attack  . Emphysema Father        smoked  . Asthma Father   . Heart disease Father   . Heart disease Sister        had open heart surgery age 41  . Diabetes Sister   . Asthma Brother   . CAD Brother        had open heart surgery at 32  . Kidney failure Brother   . Stroke Sister   . Sjogren's syndrome Mother    Past Surgical History:  Procedure Laterality Date  . BREAST ENHANCEMENT SURGERY    . cataracts  2016  . CERVICAL SPINE SURGERY     C5-7 ACDF 12/23/2015  . [place]  . COMBINED AUGMENTATION MAMMAPLASTY AND ABDOMINOPLASTY    . DRUG INDUCED ENDOSCOPY N/A 07/21/2018   Procedure: DRUG INDUCED ENDOSCOPY;  Surgeon: Jerrell Belfast, MD;  Location: [facility];  Service: ENT;  Laterality: N/A;  Drug induced sleep endoscopy  .  HEMORROIDECTOMY  2009  . IMPLANTATION OF HYPOGLOSSAL NERVE STIMULATOR  03/09/2019   [id number]  . IMPLANTATION OF HYPOGLOSSAL NERVE STIMULATOR Right 03/09/2019   Procedure: IMPLANTATION OF HYPOGLOSSAL NERVE STIMULATOR;  Surgeon: Jerrell Belfast, MD;  Location: [facility];  Service: ENT;  Laterality: Right;   Social History   Social History Narrative   Pt lives in [place] with spouse.  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   Immunization History  Administered Date(s) Administered  . Influenza Split 02/12/2014  . PFIZER SARS-COV-2 Vaccination 07/27/2019, 08/19/2019  .  Pneumococcal-Unspecified 06/14/2012  . Tdap 12/26/2010     Objective: Vital Signs: There were no vitals taken for this visit.   Physical Exam   Musculoskeletal Exam: ***  CDAI Exam: CDAI Score: -- Patient Global: --; Provider Global: -- Swollen: --; Tender: -- Joint Exam 09/26/2019   No joint exam has been documented for this visit   There is currently no information documented on the homunculus. Go to the Rheumatology activity and complete the homunculus joint exam.  Investigation: No additional findings.  Imaging: No results found.  Recent Labs: Lab Results  Component Value Date   WBC 6.7 02/28/2019   HGB 13.6 02/28/2019   PLT 259 02/28/2019   NA 139 02/28/2019   K 3.4 (L) 02/28/2019   CL 102 02/28/2019   CO2 26 02/28/2019   GLUCOSE 99 02/28/2019   BUN 26 (H) 02/28/2019   CREATININE 0.99 02/28/2019   BILITOT 1.9 (H) 06/03/2018   ALKPHOS 71 06/03/2018   AST 22 06/03/2018   ALT 25 06/03/2018   PROT 7.2 06/03/2018   ALBUMIN 4.1 06/03/2018   CALCIUM 9.4 02/28/2019   GFRAA >60 02/28/2019    Speciality Comments: No specialty comments available.  Procedures:  No procedures performed Allergies: Pollen extract and Codeine   Assessment / Plan:     Visit Diagnoses: Pain in both hands  History of hypothyroidism  History of hyperlipidemia  Essential hypertension  History of asthma  Hx of migraines  Sleep apnea treated with nocturnal BiPAP  History of gastroesophageal reflux (GERD)  History of chronic kidney disease  Age-related osteoporosis without current pathological fracture - Completed forteo 9/17-9/19), on Prolia-has had 2 injections-03/2019  DDD (degenerative disc disease), cervical  Hx of colonic polyps  Orders: No orders of the defined types were placed in this encounter.  No orders of the defined types were placed in this encounter.   Face-to-face time spent with patient was *** minutes. Greater than 50% of time was spent in  counseling and coordination of care.  Follow-Up Instructions: No follow-ups on file.   Ofilia Neas, PA-C  Note - This record has been created using Dragon software.  Chart creation errors have been sought, but may not always  have been located. Such creation errors do not reflect on  the standard of medical care.

## 2019-09-26 ENCOUNTER — Ambulatory Visit: Payer: Medicare Other | Admitting: Rheumatology

## 2019-10-03 ENCOUNTER — Other Ambulatory Visit: Payer: Self-pay

## 2019-10-03 ENCOUNTER — Encounter: Payer: Self-pay | Admitting: Cardiovascular Disease

## 2019-10-03 ENCOUNTER — Ambulatory Visit: Payer: Medicare Other | Admitting: Cardiovascular Disease

## 2019-10-03 DIAGNOSIS — R0789 Other chest pain: Secondary | ICD-10-CM | POA: Diagnosis not present

## 2019-10-03 DIAGNOSIS — I10 Essential (primary) hypertension: Secondary | ICD-10-CM

## 2019-10-03 DIAGNOSIS — G4733 Obstructive sleep apnea (adult) (pediatric): Secondary | ICD-10-CM | POA: Diagnosis not present

## 2019-10-03 DIAGNOSIS — R42 Dizziness and giddiness: Secondary | ICD-10-CM

## 2019-10-03 NOTE — Assessment & Plan Note (Signed)
On CPAP with an Inspiris implant.

## 2019-10-03 NOTE — Assessment & Plan Note (Signed)
Elizabeth Walker complains of presyncope.  She did wear a 30-day event monitor that did show episodes of sinus bradycardia down into the 30s.  I decrease her Bystolic from 5 mg to 2.5 mg.  She gets episodic presyncope and she did not have an episode while wearing her event monitor.  I am going to refer her to Dr. Sallyanne Kuster for implantation of a loop recorder.

## 2019-10-03 NOTE — Assessment & Plan Note (Signed)
History of atypical chest pain with a coronary calcium score of 0.3 performed 05/01/2019.

## 2019-10-03 NOTE — Assessment & Plan Note (Signed)
History of essential hypertension blood pressure measured today 118/74.  She is on low-dose Bystolic, Avapro and chlorthalidone.

## 2019-10-03 NOTE — Progress Notes (Signed)
10/03/2019 Elizabeth Walker   1946-04-29  [id number]  Primary Physician Crist Infante, MD Primary Cardiologist: Lorretta Harp MD Lupe Carney, [place]  HPI:  Elizabeth Walker is a 74 y.o.   thin and fit appearing married Caucasian female mother of 2, grandmother of 7 grandchildren who worked last at [employer].  She was referred by her PCP, Dr. Joylene Draft for cardiovascular valuation because of bradycardia.  She has seen Dr. Rayann Heman in the past 08/20/2011 for palpitations.    I last saw her in the office 05/24/2019.  Her cardiac risk factor profile is notable for treated hypertension hyperlipidemia.  Her father did die of a myocardial infarction at age 58 and her mother had bypass surgery and valve replacement.  History of bypass surgery as well.  She had a negative Myoview stress test back in 2015 and a recent coronary calcium score of 0.  She does complain of some shortness of breath but denies chest pain.  She has been on multiple antihypertensive medications but is intolerant to calcium channel blockers causing peripheral edema.  She has been on hydralazine as well.  Currently she is on Bystolic, Avapro and chlorthalidone.  Her heart rate runs in the 40s.  She complains of chronic fatigue.  She also has episodes of presyncope.  A 2D echo was performed that was essentially normal.  Event monitor was also placed that revealed episodes of sinus bradycardia.  Based on this I did decrease her Bystolic from 5 to 2.5 mg a day which has resulted in an increase in her heart rate up in the 60 range.  She continues to have episodic presyncope although this did not occur while she was wearing her 30-day event monitor.  She also complains of some dyspnea on exertion.  She has had atypical chest pain in the past and had a coronary calcium score performed 05/01/2019 which was 0.3.   Current Meds  Medication Sig  . acetaminophen (TYLENOL) 500 MG tablet Take 500 mg by mouth every 6 (six) hours as needed (for  pain.).   Marland Kitchen albuterol (VENTOLIN HFA) 108 (90 Base) MCG/ACT inhaler Inhale 1 puff into the lungs as needed.  Marland Kitchen atorvastatin (LIPITOR) 40 MG tablet TAKE ONE TABLET BY MOUTH ONE TIME DAILY  (Patient taking differently: Take 40 mg by mouth daily. )  . budesonide-formoterol (SYMBICORT) 160-4.5 MCG/ACT inhaler Inhale 2 puffs into the lungs 2 (two) times daily as needed (respiratory issues.).   Marland Kitchen cefdinir (OMNICEF) 300 MG capsule Take 300 mg by mouth 2 (two) times daily.  . chlorthalidone (HYGROTON) 25 MG tablet Take 25 mg by mouth every morning.   . denosumab (PROLIA) 60 MG/ML SOSY injection Inject 60 mg into the skin every 6 (six) months.  . ergocalciferol (VITAMIN D2) 50000 UNITS capsule Take 50,000 Units by mouth every Saturday.   . fluticasone (FLONASE) 50 MCG/ACT nasal spray Place 1-2 sprays into both nostrils daily as needed for allergies or rhinitis.  Marland Kitchen irbesartan (AVAPRO) 300 MG tablet Take 300 mg by mouth every morning.   Marland Kitchen levothyroxine (SYNTHROID, LEVOTHROID) 100 MCG tablet TAKE 1 TABLET BY MOUTH ONCE DAILY (Patient taking differently: Take 100 mcg by mouth daily before breakfast. )  . LORazepam (ATIVAN) 0.5 MG tablet Take 0.5 mg by mouth 2 (two) times daily as needed (anxiety).   . meclizine (ANTIVERT) 25 MG tablet Take 1 tablet (25 mg total) by mouth 3 (three) times daily as needed for dizziness.  . nebivolol (  BYSTOLIC) 2.5 MG tablet Take 1 tablet (2.5 mg total) by mouth daily. (Patient taking differently: Take 2.5 mg by mouth every evening. )  . omeprazole (PRILOSEC) 20 MG capsule Take 20 mg by mouth daily before breakfast.  . ondansetron (ZOFRAN ODT) 4 MG disintegrating tablet Take 1 tablet (4 mg total) by mouth every 8 (eight) hours as needed.  . Probiotic Product (ACIDOPHILUS/GOAT MILK) CAPS Take by mouth.  . Respiratory Therapy Supplies (FLUTTER) DEVI Use as directed     Allergies  Allergen Reactions  . Pollen Extract Other (See Comments)    Stuffy nose  . Codeine Nausea And  Vomiting    Social History   Socioeconomic History  . Marital status: Married    Spouse name: Not on file  . Number of children: Not on file  . Years of education: Not on file  . Highest education level: Not on file  Occupational History  . Not on file  Tobacco Use  . Smoking status: Never Smoker  . Smokeless tobacco: Never Used  Substance and Sexual Activity  . Alcohol use: Yes    Comment: rare wine  . Drug use: No  . Sexual activity: Not on file  Other Topics Concern  . Not on file  Social History Narrative   Pt lives in [place] with spouse.  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   Social Determinants of Health   Financial Resource Strain:   . Difficulty of Paying Living Expenses:   Food Insecurity:   . Worried About Charity fundraiser in the Last Year:   . Arboriculturist in the Last Year:   Transportation Needs:   . Film/video editor (Medical):   Marland Kitchen Lack of Transportation (Non-Medical):   Physical Activity:   . Days of Exercise per Week:   . Minutes of Exercise per Session:   Stress:   . Feeling of Stress :   Social Connections:   . Frequency of Communication with Friends and Family:   . Frequency of Social Gatherings with Friends and Family:   . Attends Religious Services:   . Active Member of Clubs or Organizations:   . Attends Archivist Meetings:   Marland Kitchen Marital Status:   Intimate Partner Violence:   . Fear of Current or Ex-Partner:   . Emotionally Abused:   Marland Kitchen Physically Abused:   . Sexually Abused:      Review of Systems: General: negative for chills, fever, night sweats or weight changes.  Cardiovascular: negative for chest pain, dyspnea on exertion, edema, orthopnea, palpitations, paroxysmal nocturnal dyspnea or shortness of breath Dermatological: negative for rash Respiratory: negative for cough or wheezing Urologic: negative for hematuria Abdominal: negative for nausea, vomiting, diarrhea, bright Walker blood per rectum, melena,  or hematemesis Neurologic: negative for visual changes, syncope, or dizziness All other systems reviewed and are otherwise negative except as noted above.    Blood pressure 118/74, pulse 67, height 5' 0.5" (1.537 m), weight 115 lb 8 oz (52.4 kg), SpO2 96 %.  General appearance: alert and no distress Neck: no adenopathy, no carotid bruit, no JVD, supple, symmetrical, trachea midline and thyroid not enlarged, symmetric, no tenderness/mass/nodules Lungs: clear to auscultation bilaterally Heart: regular rate and rhythm, S1, S2 normal, no murmur, click, rub or gallop Extremities: extremities normal, atraumatic, no cyanosis or edema Pulses: 2+ and symmetric Skin: Skin color, texture, turgor normal. No rashes or lesions Neurologic: Alert and oriented X 3, normal strength and tone. Normal symmetric reflexes. Normal  coordination and gait  EKG not performed today  ASSESSMENT AND PLAN:   HYPERLIPIDEMIA History of hyperlipidemia on statin therapy with lipid profile performed 04/05/2019 revealing total cholesterol 182, LDL of 96 and HDL 40.  Essential hypertension History of essential hypertension blood pressure measured today 118/74.  She is on low-dose Bystolic, Avapro and chlorthalidone.  OSA (obstructive sleep apnea) On CPAP with an Inspiris implant.  Atypical chest pain History of atypical chest pain with a coronary calcium score of 0.3 performed 05/01/2019.  Dizziness Ms. Bolles complains of presyncope.  She did wear a 30-day event monitor that did show episodes of sinus bradycardia down into the 30s.  I decrease her Bystolic from 5 mg to 2.5 mg.  She gets episodic presyncope and she did not have an episode while wearing her event monitor.  I am going to refer her to Dr. Sallyanne Kuster for implantation of a loop recorder.      Lorretta Harp MD FACP,FACC,FAHA, [facility] 10/03/2019 4:21 PM

## 2019-10-03 NOTE — Assessment & Plan Note (Signed)
History of hyperlipidemia on statin therapy with lipid profile performed 04/05/2019 revealing total cholesterol 182, LDL of 96 and HDL 40.

## 2019-10-03 NOTE — Patient Instructions (Signed)
Medication Instructions:  Your physician recommends that you continue on your current medications as directed. Please refer to the Current Medication list given to you today.  *If you need a refill on your cardiac medications before your next appointment, please call your pharmacy*  Follow-Up: At [facility], you and your health needs are our priority.  As part of our continuing mission to provide you with exceptional heart care, we have created designated Provider Care Teams.  These Care Teams include your primary Cardiologist (physician) and Advanced Practice Providers (APPs -  Physician Assistants and Nurse Practitioners) who all work together to provide you with the care you need, when you need it.  We recommend signing up for the patient portal called "MyChart".  Sign up information is provided on this After Visit Summary.  MyChart is used to connect with patients for Virtual Visits (Telemedicine).  Patients are able to view lab/test results, encounter notes, upcoming appointments, etc.  Non-urgent messages can be sent to your provider as well.   To learn more about what you can do with MyChart, go to [email or web address].    Your next appointment:   4 month(s)  The format for your next appointment:   In Person  Provider:   Quay Burow, MD   Other Instructions You have been referred to Dr. Dani Gobble Croitoru to discuss a loop recorder

## 2019-10-05 ENCOUNTER — Telehealth: Payer: Self-pay | Admitting: *Deleted

## 2019-10-05 NOTE — Telephone Encounter (Signed)
The patient has been called about implanting a loop recorder per Dr. Gwenlyn Found.  The procedure and how the loop recorder works has been gone over with the patient. She has verbalized her understanding and agreement to the procedure.  It has been scheduled for 10/24/19 with Dr. Sallyanne Kuster. She has been advised to call back with any questions.  Instructions will be mailed.   [facility] [address], [address]  [place], [place] [address]  Phone: [phone] Fax: [phone]   You are scheduled for a Loop Recorder Implant on 10/24/19  at 11:30 am with Dr. Sallyanne Kuster. This will take place at [address], [address].    Please arrive at your appointment 15-20 minutes early.    You do not need to be fasting.    The procedure is performed with local anesthesia. You will not receive sedatives nor will an IV be placed.    Wash your chest and neck with the surgical soap the evening before and the morning of your procedure. Please following the washing instructions provided.    As with all surgical implants, there is a small risk of infection. If an infection occurs, the device will be removed. To help reduce the risk, please use the surgical scrub provided. Additional antiseptic precautions will be taken at the time of the procedure.    Please bring your insurance cards.   *Please note that scheduled loop recorder implants may need to be rescheduled if Dr. Sallyanne Kuster has a procedure urgently added on at the hospital   Preparing for the Procedure  Before the procedure, you can play an important role. Because skin is not sterile, your skin needs to be as free of germs as possible. You can reduce the number of germs on your skin by washing with CHG (chlorhexidine gluconate) Soap before the procedure. CHG is an antiseptic cleaner which kills germs and bonds with the skin to continue killing germs even after washing.  Please do not use if you have an allergy  to CHG or antibacterial soaps. If your skin becomes reddened/irritated, STOP using the CHG.  DO NOT SHAVE (including legs and underarms) for at least 48 hours prior to first CHG shower. It is OK to shave your face.  Please follow these instructions carefully: 1. Shower the night before the procedure and the morning of with CHG Soap. 2. If you chose to wash your hair, wash your hair first as usual with your normal shampoo/conditioner. 3. After you shampoo/condition, rinse you hair and body thoroughly to remove shampoo/conditioner. 4. Use CHG as you would any other liquid soap. You can apply CHG directly to the skin and wash gently with a loofah or a clean washcloth. 5. Apply the CHG Soap to your body ONLY FROM THE NECK DOWN. Do not use on open wounds or open sores. Avoid contact with your eyes, ears, mouth, and genitals (private parts).  6. Wash thoroughly, paying special attention to the area where your surgery will be performed. 7. Thoroughly rinse your body with warm water from the neck down. 8. DO NOT shower/wash with your normal soap after using and rinsing off the CHG Soap. 9. Pat yourself dry with a clean towel. 10. Wear clean pajamas to bed. 11. Place clean sheets on your bed the night of your first shower and do not sleep with pets..  Day of Surgery: Shower with the CHG Soap following the instructions listed above. DO NOT apply deodorants or lotions.

## 2019-10-16 ENCOUNTER — Encounter: Payer: Self-pay | Admitting: *Deleted

## 2019-10-18 ENCOUNTER — Ambulatory Visit: Payer: Medicare Other | Admitting: Rheumatology

## 2019-10-24 ENCOUNTER — Ambulatory Visit (INDEPENDENT_AMBULATORY_CARE_PROVIDER_SITE_OTHER): Payer: Medicare Other | Admitting: Cardiovascular Disease

## 2019-10-24 ENCOUNTER — Telehealth: Payer: Self-pay | Admitting: *Deleted

## 2019-10-24 ENCOUNTER — Other Ambulatory Visit: Payer: Self-pay

## 2019-10-24 DIAGNOSIS — R55 Syncope and collapse: Secondary | ICD-10-CM | POA: Diagnosis not present

## 2019-10-24 MED ORDER — LIDOCAINE-EPINEPHRINE 1 %-1:100000 IJ SOLN: 10.0000 mL | Freq: Once | INTRAMUSCULAR | Status: AC

## 2019-10-24 NOTE — Telephone Encounter (Signed)
The patient came in today for a loop recorder implant. She was concerned that insurance had not approved this.   A call was placed to her insurance company and they have advised that no prior authorization was not needed. CPT code was 33285. Out of pocket expense would be no more than $585.81  The patient has agreed to move forward with the implant.

## 2019-10-24 NOTE — Patient Instructions (Addendum)
Discharge Instructions for  Loop Recorder Implant    Follow up: Keep your wound check appointment on 11/01/19 at 2 pm. Keep your follow up with Dr. Gwenlyn Found on 02/05/20 at 1:30 pm  If you have any questions or concerns, please call the office at [phone].  ACTIVITY No restrictions. DO wear your seatbelt, even if it crosses over the site.   WOUND CARE  Keep the wound area clean and dry.  Remove the dressing the day after (usually 24 hours after the procedure).  DO NOT SUBMERGE UNDER WATER UNTIL FULLY HEALED (no tub baths, hot tubs, swimming pools, etc.).   You  may shower or take a sponge bath after the dressing is removed. DO NOT SOAK the area and do not allow the shower to directly spray on the site.  If you have tape/steri-strips on your wound, these will fall off; do not pull them off prematurely.    No bandage is needed on the site.  DO  NOT apply any creams, oils, or ointments to the wound area.  If you notice any drainage or discharge from the wound, any swelling, excessive redness or bruising at the site, or if you develop a fever > 101? F, call the office at once at [phone].

## 2019-10-24 NOTE — Progress Notes (Signed)
Please refer to H/P 10/03/2019, Dr. Gwenlyn Found. Here for loop recorder implantation for recurrent presyncope.  This procedure has been fully reviewed with the patient and written informed consent has been obtained.   LOOP RECORDER IMPLANT   Procedure report  Procedure performed:  Loop recorder implantation   Reason for procedure:  1. 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 The patient was prepped and draped in usual sterile fashion. Local anesthesia was administered to an area 2 cm to the left of the sternum in the 4th intercostal space. A cutaneous incision was made using the incision tool. The introducer was then used to create a subcutaneous tunnel and carefully deploy the device. Local pressure was held to ensure hemostasis.  The incision was closed with SteriStrips and a sterile dressing was applied.   Sanda Klein, MD, [facility] [phone] office [phone] pager 10/24/2019 11:19 AM

## 2019-10-29 ENCOUNTER — Ambulatory Visit (INDEPENDENT_AMBULATORY_CARE_PROVIDER_SITE_OTHER): Payer: Medicare Other | Admitting: Psychology

## 2019-10-29 DIAGNOSIS — F5101 Primary insomnia: Secondary | ICD-10-CM | POA: Diagnosis not present

## 2019-10-29 DIAGNOSIS — F4321 Adjustment disorder with depressed mood: Secondary | ICD-10-CM | POA: Diagnosis not present

## 2019-11-01 ENCOUNTER — Other Ambulatory Visit: Payer: Self-pay

## 2019-11-01 ENCOUNTER — Ambulatory Visit (INDEPENDENT_AMBULATORY_CARE_PROVIDER_SITE_OTHER): Payer: Medicare Other | Admitting: Emergency Medicine

## 2019-11-01 DIAGNOSIS — R55 Syncope and collapse: Secondary | ICD-10-CM

## 2019-11-01 LAB — CUP PACEART INCLINIC DEVICE CHECK
Date Time Interrogation Session: 20210520170908
Implantable Pulse Generator Implant Date: 20210512

## 2019-11-01 NOTE — Progress Notes (Signed)
ILR wound check in clinic. Steri strips removed. Wound well healed.  R-waves 0.41 mV. Home monitor transmitting nightly. 2 pause episodes that are false and related to undersensing. Patient was awake on 10/29/19 at 0126 and was asymptomatic, reports dizziness on 11/01/19  that is no new sx at 0641, hx of vertigo.  Questions answered.

## 2019-11-20 ENCOUNTER — Ambulatory Visit (INDEPENDENT_AMBULATORY_CARE_PROVIDER_SITE_OTHER): Payer: Medicare Other | Admitting: Psychology

## 2019-11-20 DIAGNOSIS — F4321 Adjustment disorder with depressed mood: Secondary | ICD-10-CM | POA: Diagnosis not present

## 2019-11-20 DIAGNOSIS — F5101 Primary insomnia: Secondary | ICD-10-CM

## 2019-11-27 ENCOUNTER — Ambulatory Visit (INDEPENDENT_AMBULATORY_CARE_PROVIDER_SITE_OTHER): Payer: Medicare Other | Admitting: *Deleted

## 2019-11-27 DIAGNOSIS — R55 Syncope and collapse: Secondary | ICD-10-CM

## 2019-11-28 LAB — CUP PACEART REMOTE DEVICE CHECK
Date Time Interrogation Session: 20210614202618
Implantable Pulse Generator Implant Date: 20210512

## 2019-11-28 NOTE — Progress Notes (Signed)
Carelink Summary Report / Loop Recorder 

## 2019-12-04 ENCOUNTER — Ambulatory Visit (INDEPENDENT_AMBULATORY_CARE_PROVIDER_SITE_OTHER): Payer: Medicare Other | Admitting: Psychology

## 2019-12-04 DIAGNOSIS — F5101 Primary insomnia: Secondary | ICD-10-CM | POA: Diagnosis not present

## 2019-12-04 DIAGNOSIS — F4321 Adjustment disorder with depressed mood: Secondary | ICD-10-CM | POA: Diagnosis not present

## 2019-12-19 ENCOUNTER — Ambulatory Visit (INDEPENDENT_AMBULATORY_CARE_PROVIDER_SITE_OTHER): Payer: Medicare Other | Admitting: Psychology

## 2019-12-19 DIAGNOSIS — F4321 Adjustment disorder with depressed mood: Secondary | ICD-10-CM

## 2019-12-19 DIAGNOSIS — F5101 Primary insomnia: Secondary | ICD-10-CM

## 2019-12-31 ENCOUNTER — Ambulatory Visit (INDEPENDENT_AMBULATORY_CARE_PROVIDER_SITE_OTHER): Payer: Medicare Other | Admitting: *Deleted

## 2019-12-31 DIAGNOSIS — R55 Syncope and collapse: Secondary | ICD-10-CM

## 2019-12-31 LAB — CUP PACEART REMOTE DEVICE CHECK
Date Time Interrogation Session: 20210718231028
Implantable Pulse Generator Implant Date: 20210512

## 2020-01-02 ENCOUNTER — Ambulatory Visit (INDEPENDENT_AMBULATORY_CARE_PROVIDER_SITE_OTHER): Payer: Medicare Other | Admitting: Psychology

## 2020-01-02 DIAGNOSIS — F4321 Adjustment disorder with depressed mood: Secondary | ICD-10-CM

## 2020-01-02 DIAGNOSIS — F5101 Primary insomnia: Secondary | ICD-10-CM | POA: Diagnosis not present

## 2020-01-02 NOTE — Progress Notes (Signed)
Carelink Summary Report / Loop Recorder 

## 2020-01-16 ENCOUNTER — Ambulatory Visit: Payer: Medicare Other | Admitting: Rheumatology

## 2020-01-17 ENCOUNTER — Ambulatory Visit (INDEPENDENT_AMBULATORY_CARE_PROVIDER_SITE_OTHER): Payer: Medicare Other | Admitting: Psychology

## 2020-01-17 DIAGNOSIS — F5101 Primary insomnia: Secondary | ICD-10-CM

## 2020-01-17 DIAGNOSIS — F4321 Adjustment disorder with depressed mood: Secondary | ICD-10-CM

## 2020-02-04 ENCOUNTER — Ambulatory Visit (INDEPENDENT_AMBULATORY_CARE_PROVIDER_SITE_OTHER): Payer: Medicare Other | Admitting: *Deleted

## 2020-02-04 DIAGNOSIS — R55 Syncope and collapse: Secondary | ICD-10-CM | POA: Diagnosis not present

## 2020-02-04 LAB — CUP PACEART REMOTE DEVICE CHECK
Date Time Interrogation Session: 20210820230104
Implantable Pulse Generator Implant Date: 20210512

## 2020-02-05 ENCOUNTER — Ambulatory Visit: Payer: Medicare Other | Admitting: Psychology

## 2020-02-05 ENCOUNTER — Ambulatory Visit: Payer: Medicare Other | Admitting: Cardiovascular Disease

## 2020-02-07 NOTE — Progress Notes (Signed)
Carelink Summary Report / Loop Recorder 

## 2020-02-12 ENCOUNTER — Ambulatory Visit: Payer: Medicare Other | Admitting: Rheumatology

## 2020-02-20 ENCOUNTER — Ambulatory Visit: Payer: Medicare Other | Admitting: Psychology

## 2020-02-21 ENCOUNTER — Telehealth: Payer: Self-pay

## 2020-02-21 NOTE — Telephone Encounter (Signed)
LVM for patient to call and RS her follow up appt for Inspire with Dr. Brett Fairy

## 2020-02-25 ENCOUNTER — Ambulatory Visit: Payer: Medicare Other | Admitting: Adult Health

## 2020-02-25 ENCOUNTER — Ambulatory Visit: Payer: Self-pay | Admitting: Neurology

## 2020-03-05 ENCOUNTER — Ambulatory Visit (INDEPENDENT_AMBULATORY_CARE_PROVIDER_SITE_OTHER): Payer: Medicare Other | Admitting: Psychology

## 2020-03-05 DIAGNOSIS — F4321 Adjustment disorder with depressed mood: Secondary | ICD-10-CM | POA: Diagnosis not present

## 2020-03-05 DIAGNOSIS — F5101 Primary insomnia: Secondary | ICD-10-CM

## 2020-03-10 ENCOUNTER — Ambulatory Visit (INDEPENDENT_AMBULATORY_CARE_PROVIDER_SITE_OTHER): Payer: Medicare Other | Admitting: Emergency Medicine

## 2020-03-10 DIAGNOSIS — R55 Syncope and collapse: Secondary | ICD-10-CM | POA: Diagnosis not present

## 2020-03-10 LAB — CUP PACEART REMOTE DEVICE CHECK
Date Time Interrogation Session: 20210922230513
Implantable Pulse Generator Implant Date: 20210512

## 2020-03-11 ENCOUNTER — Ambulatory Visit: Payer: Self-pay | Admitting: Neurology

## 2020-03-12 NOTE — Progress Notes (Signed)
Carelink Summary Report / Loop Recorder 

## 2020-04-08 ENCOUNTER — Ambulatory Visit (INDEPENDENT_AMBULATORY_CARE_PROVIDER_SITE_OTHER): Payer: Medicare Other | Admitting: Psychology

## 2020-04-08 DIAGNOSIS — F5101 Primary insomnia: Secondary | ICD-10-CM

## 2020-04-08 DIAGNOSIS — F4321 Adjustment disorder with depressed mood: Secondary | ICD-10-CM | POA: Diagnosis not present

## 2020-04-14 ENCOUNTER — Ambulatory Visit (INDEPENDENT_AMBULATORY_CARE_PROVIDER_SITE_OTHER): Payer: Medicare Other

## 2020-04-14 DIAGNOSIS — R55 Syncope and collapse: Secondary | ICD-10-CM

## 2020-04-14 LAB — CUP PACEART REMOTE DEVICE CHECK
Date Time Interrogation Session: 20211101141338
Implantable Pulse Generator Implant Date: 20210512

## 2020-04-16 NOTE — Progress Notes (Signed)
Carelink Summary Report / Loop Recorder 

## 2020-05-05 ENCOUNTER — Encounter: Payer: Self-pay | Admitting: Neurology

## 2020-05-05 ENCOUNTER — Ambulatory Visit: Payer: Medicare Other | Admitting: Neurology

## 2020-05-05 VITALS — BP 122/60 | HR 53 | Ht 60.0 in | Wt 118.0 lb

## 2020-05-05 DIAGNOSIS — G4733 Obstructive sleep apnea (adult) (pediatric): Secondary | ICD-10-CM | POA: Diagnosis not present

## 2020-05-05 DIAGNOSIS — Z789 Other specified health status: Secondary | ICD-10-CM

## 2020-05-05 DIAGNOSIS — G4721 Circadian rhythm sleep disorder, delayed sleep phase type: Secondary | ICD-10-CM | POA: Diagnosis not present
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
## 2020-05-16 LAB — CUP PACEART REMOTE DEVICE CHECK
Date Time Interrogation Session: 20211127230445
Implantable Pulse Generator Implant Date: 20210512

## 2020-05-19 ENCOUNTER — Ambulatory Visit (INDEPENDENT_AMBULATORY_CARE_PROVIDER_SITE_OTHER): Payer: Medicare Other

## 2020-05-19 DIAGNOSIS — R55 Syncope and collapse: Secondary | ICD-10-CM

## 2020-05-20 ENCOUNTER — Other Ambulatory Visit: Payer: Self-pay | Admitting: Internal Medicine

## 2020-05-20 DIAGNOSIS — N1832 Chronic kidney disease, stage 3b: Secondary | ICD-10-CM

## 2020-05-23 ENCOUNTER — Telehealth: Payer: Self-pay

## 2020-05-23 NOTE — Telephone Encounter (Signed)
Agree that looks like undersensing artifact. Thank you

## 2020-05-23 NOTE — Telephone Encounter (Signed)
ILR alert received for 1 pause event, 32 seconds on 05/22/20 @ 06:25 AM. Appears undersensing.  Called patient to assess. Reports she was asleep during this time and has not had any issues that she is aware of. Education provided on symptom activator. Advised to call with questions or concerns.   Reprogrammed device sensitivity to increase to 0.025 mV.

## 2020-05-28 NOTE — Progress Notes (Signed)
Carelink Summary Report / Loop Recorder 

## 2020-06-04 ENCOUNTER — Other Ambulatory Visit: Payer: Medicare Other

## 2020-06-19 ENCOUNTER — Other Ambulatory Visit: Payer: Self-pay

## 2020-06-19 ENCOUNTER — Ambulatory Visit
Admission: RE | Admit: 2020-06-19 | Discharge: 2020-06-19 | Disposition: A | Discharge status: Home / Self Care | Payer: Medicare Other | Source: Ambulatory Visit | Attending: Internal Medicine | Admitting: Internal Medicine

## 2020-06-19 DIAGNOSIS — N1832 Chronic kidney disease, stage 3b: Secondary | ICD-10-CM

## 2020-06-23 ENCOUNTER — Ambulatory Visit (INDEPENDENT_AMBULATORY_CARE_PROVIDER_SITE_OTHER): Payer: Medicare Other

## 2020-06-23 ENCOUNTER — Ambulatory Visit (INDEPENDENT_AMBULATORY_CARE_PROVIDER_SITE_OTHER): Payer: Medicare Other | Admitting: Neurology

## 2020-06-23 DIAGNOSIS — G4733 Obstructive sleep apnea (adult) (pediatric): Secondary | ICD-10-CM | POA: Diagnosis not present

## 2020-06-23 DIAGNOSIS — T859XXD Unspecified complication of internal prosthetic device, implant and graft, subsequent encounter: Secondary | ICD-10-CM

## 2020-06-23 DIAGNOSIS — G4721 Circadian rhythm sleep disorder, delayed sleep phase type: Secondary | ICD-10-CM

## 2020-06-23 DIAGNOSIS — Z789 Other specified health status: Secondary | ICD-10-CM

## 2020-06-23 DIAGNOSIS — R55 Syncope and collapse: Secondary | ICD-10-CM | POA: Diagnosis not present

## 2020-06-23 LAB — CUP PACEART REMOTE DEVICE CHECK
Date Time Interrogation Session: 20220108230436
Implantable Pulse Generator Implant Date: 20210512

## 2020-06-25 NOTE — Progress Notes (Signed)
   [facility] [place] (Watch PAT)  STUDY DATE: 06/25/20  DOB: 08/13/1945  MRN: [id number]  ORDERING CLINICIAN: [name], MD   REFERRING CLINICIAN: Crist Infante, MD   CLINICAL INFORMATION/HISTORY: INSPIRE  Mrs. Elizabeth Walker. [name] is a 75 year old chronic insomnia patient who underwent an Inspire procedure and is here to see the effect  who also was intolerant of CPAP- mainly due to chronic and recurrent sinusitis.  Her initial presentation was for excessive daytime sleepiness but once apnea was diagnosed and treated the underlying problem of chronic insomnia persistet. I had referred her for cognitive behavioral therapy and she completed her treatment sessions there but to no avail. She remained with hypersomnia, sleep attacks, and high fatigue through the BiPAP therapy. She underwent Inspire implantation 15 month ago . A narcolepsy HLA test was negative in 2019.  From Dr. Maxwell Caul at [place], who further worked on her insomnia and diagnosed her with a circadian rhythm disorder, she ended at [facility].  A HST in 2019 had shown AHI of 51.9/h, unexpectedly high. Inspire was implanted after that finding.   Epworth sleepiness score: 17/24. FSS at 54/63 points, patient with cochlear implant and Inspire implant .  BMI: 23.4 kg/m Neck Circumference: 15 "  FINDINGS:   Total Record Time (hours, min): 7 h 2 min Total Sleep Time (hours, min):  6 h 25 min  Percent REM (%):    25.59 %   Calculated pAHI (per hour):  20.6      REM pAHI:    35.1     NREM pAHI: 16.0  Supine AHI: 22.7   Oxygen Saturation (%) Mean: 95  Minimum oxygen saturation (%):         91   O2 Saturation Range (%): 91-99  O2Saturation (minutes) <=88%: 0.0  Pulse Mean (bpm):    52  Pulse Range (42-80)   IMPRESSION: this HST confirmed the ongoing presence of moderate sleep apnea, OSA (obstructive sleep apnea) with REM sleep accentuation. There were non clinically relevant signs of low oxygen  saturation.    RECOMMENDATION: The patient is only partially treated by the inspire device implant. An increase in Voltage will be needed to address the high remaining AHI of 20/h.  I will ask for a new Inspire titration with the goal of reducing the apnea to less than 7/h. .   INTERPRETING PHYSICIAN:  [name], MD Board Certified in Neurology and in [place] Director of [facility]. [email or web address]  [facility] [address] [place], [place] [address]

## 2020-07-01 DIAGNOSIS — G4721 Circadian rhythm sleep disorder, delayed sleep phase type: Secondary | ICD-10-CM | POA: Insufficient documentation

## 2020-07-01 NOTE — Progress Notes (Signed)
IMPRESSION: this HST confirmed the ongoing presence of moderate sleep apnea, OSA (obstructive sleep apnea) with REM sleep accentuation. There were non clinically relevant signs of low oxygen saturation.    RECOMMENDATION: The patient is only partially treated by the inspire device implant. An increase in Voltage will be needed to address the high remaining AHI of 20/h.  I will ask for a new Inspire titration with the goal of reducing the apnea to less than 7/h. Marland Kitchen

## 2020-07-01 NOTE — Procedures (Signed)
[facility] [place] (Watch PAT)  STUDY DATE: 06/25/20  DOB: 1945-06-24  MRN: [id number]  ORDERING CLINICIAN: [name], MD   REFERRING CLINICIAN: Crist Infante, MD   CLINICAL INFORMATION/HISTORY: INSPIRE  Mrs. Elizabeth Walker. Burlison is a 75 year old chronic insomnia patient who underwent an Inspire procedure and is here to see the effect  who also was intolerant of CPAP- mainly due to chronic and recurrent sinusitis.  Her initial presentation was for excessive daytime sleepiness but once apnea was diagnosed and treated the underlying problem of chronic insomnia persistet. I had referred her for cognitive behavioral therapy and she completed her treatment sessions there but to no avail. She remained with hypersomnia, sleep attacks, and high fatigue through the BiPAP therapy. She underwent Inspire implantation 15 month ago . A narcolepsy HLA test was negative in 2019.  From Dr. Maxwell Caul at [place], who further worked on her insomnia and diagnosed her with a circadian rhythm disorder, she ended at [facility].  A HST in 2019 had shown AHI of 51.9/h, unexpectedly high. Inspire was implanted after that finding.   Epworth sleepiness score: 17/24. FSS at 54/63 points, patient with cochlear implant and Inspire implant .  BMI: 23.4 kg/m Neck Circumference: 15 "  FINDINGS:   Total Record Time (hours, min): 7 h 2 min Total Sleep Time (hours, min):  6 h 25 min  Percent REM (%):    25.59 %   Calculated pAHI (per hour):  20.6      REM pAHI:    35.1     NREM pAHI: 16.0  Supine AHI: 22.7   Oxygen Saturation (%) Mean: 95  Minimum oxygen saturation (%):         91   O2 Saturation Range (%): 91-99  O2Saturation (minutes) <=88%: 0.0  Pulse Mean (bpm):    52  Pulse Range (42-80)   IMPRESSION: this HST confirmed the ongoing presence of moderate sleep apnea, OSA (obstructive sleep apnea) with REM sleep accentuation. There were non clinically relevant signs of low oxygen saturation.     RECOMMENDATION: The patient is only partially treated by the inspire device implant. An increase in Voltage will be needed to address the high remaining AHI of 20/h.  I will ask for a new Inspire titration with the goal of reducing the apnea to less than 7/h. .   INTERPRETING PHYSICIAN:  [name], MD Board Certified in Neurology and in [place] Director of [facility]. @[place]  [facility] [address] [place],  [address]

## 2020-07-01 NOTE — Addendum Note (Signed)
Addended by: [name] on: 07/01/2020 05:40 PM   Modules accepted: Orders

## 2020-07-02 ENCOUNTER — Telehealth: Payer: Self-pay

## 2020-07-02 NOTE — Telephone Encounter (Signed)
Called patient to go over HST results. Study showed she is still having apnea events at 20/hr. I explained we needed to do another titration in the lab so we can titrate her device to eliminate the events. She is willing to come in for study.I scheduled this for Feb 1st 8pm.

## 2020-07-06 NOTE — Progress Notes (Signed)
Carelink Summary Report / Loop Recorder 

## 2020-07-15 ENCOUNTER — Ambulatory Visit (INDEPENDENT_AMBULATORY_CARE_PROVIDER_SITE_OTHER): Payer: Medicare Other | Admitting: Neurology

## 2020-07-15 DIAGNOSIS — G4721 Circadian rhythm sleep disorder, delayed sleep phase type: Secondary | ICD-10-CM

## 2020-07-15 DIAGNOSIS — T859XXD Unspecified complication of internal prosthetic device, implant and graft, subsequent encounter: Secondary | ICD-10-CM

## 2020-07-15 DIAGNOSIS — G4733 Obstructive sleep apnea (adult) (pediatric): Secondary | ICD-10-CM

## 2020-07-15 DIAGNOSIS — Z789 Other specified health status: Secondary | ICD-10-CM

## 2020-07-15 DIAGNOSIS — R94130 Abnormal response to nerve stimulation, unspecified: Secondary | ICD-10-CM

## 2020-07-21 DIAGNOSIS — R94130 Abnormal response to nerve stimulation, unspecified: Secondary | ICD-10-CM | POA: Insufficient documentation

## 2020-07-21 NOTE — Progress Notes (Signed)
A Home Sleep study at [facility] in 2019 showed AHI of 51.9/h and compliance on PAP was very low.  She decided on an Inspire implant in 202 and has had a previous titration here at [facility] on 07-23-2019. Level 3 settings were reached.  She then returned for a visit due to persistent hypersomnia and we decided to repeat a HST while on Inspire - This most recent HST from 06-25-2020 has revealed a persistent AHI of 20.6/h and REM AHI of 35/h.  Here to get re-titrated.   IMPRESSION: The total sleep time was extremely short , only 115 minutes.  1. Obstructive Sleep Apnea (OSA) was controlled at an [place] setting for a Voltage of 2.0 V, but mild snoring was still present, reflected in an RDI of 6.2/h.  2. There was no hypoxemia and no irregular EKG noted.  3. INSOMNIA clearly persists.     RECOMMENDATIONS:    1. Increase setting of the device to 2.0 V after her next visit. The device has been set for a window of 1.7V through 2.7V in which the patient can slowly advance the settings.

## 2020-07-21 NOTE — Procedures (Signed)
PATIENT'S NAME:  Elizabeth, Walker  DOB:      May 06, 1946      MR#:    [id number]     DATE OF RECORDING: 07/15/2020 Rudene Christians REFERRING M.D.:  Crist Infante MD- D. Redmond Baseman, MD ENT  Study Performed:   INSPIRE TITRATION Polysomnogram HISTORY:  Elizabeth Walker is a 75 -year -old female with chronic insomnia and OSA but did not tolerate PAP well. She was a patient at [facility] last before presenting to [name] as a new patient on 08-29-2017.  She remained with symptoms of hypersomnia and Insomnia during the time of BiPAP/ASV therapy, she had CBT for insomnia and has followed Dr Maxwell Caul at [place] before coming to [name] and deciding on inspire.  A Home Sleep study at [facility] in 2019 showed AHI of 51.9/h and compliance on PAP was very low.  She decided on an Inspire implant in 202 and has had a previous titration here at [facility] on 07-23-2019. Level 3 settings were reached.  She then returned for a visit due to persistent hypersomnia and we decided to repeat a HST while on Inspire - This most recent HST from 06-25-2020 has revealed a persistent AHI of 20.6/h and REM AHI of 35/h.  Here to get re-titrated.   The patient endorsed the Epworth Sleepiness Scale at 17 points, FSS is high.   The patient's weight 118 pounds with a height of 60 (inches), resulting in a BMI of 23.4 kg/m2. The patient's neck circumference measured 15 inches.  CURRENT MEDICATIONS: Tylenol, Lipitor, Bystolic, Pepcid, Avapro, Ativan, Nitrostat, Zantac   PROCEDURE:  This is a multichannel digital polysomnogram utilizing the Somnostar 11.2 system.  Electrodes and sensors were applied and monitored per AASM Specifications.   EEG, EOG, Chin and Limb EMG, were sampled at 200 Hz.  ECG, Snore and Nasal Pressure, Thermal Airflow, Respiratory Effort, CPAP Flow and Pressure, Oximetry was sampled at 50 Hz. Digital video and audio were recorded.      TITRATION STUDY: Lights Out was at 21:32 and Lights On at 04:06.  Total recording time (TRT) was 395 minutes, with a  total sleep time (TST) of 115 minutes.    The patient's sleep latency was 7.5 minutes.  REM latency was 0 minutes.  The sleep efficiency was 52.6 %.  The Inspire device presented with a setting of 1.9V, and retitration started at 1.7 V. the Voltage was increased to 2.0V with an AHI of 0.0/h.   Sadly, sleep ended at midnight and the patient only experienced      SLEEP ARCHITECTURE: WASO (Wake after sleep onset) was 240 minutes.  There were 21 minutes in Stage N1, 56.5 minutes Stage N2, 15 minutes Stage N3 and 22.5 minutes in Stage REM.    RESPIRATORY ANALYSIS:  There were a total of 8 respiratory events:  0 apneas and 8 hypopneas with a hypopnea index of 23.4 /hour. The patient also had 2 respiratory event related arousals (RERAs).      The total APNEA/HYPOPNEA INDEX (AHI) was 23.4/hour.  0 events occurred in REM sleep and 16 events in NREM. The REM AHI was  0.0 /hour, versus a non-REM AHI of 23.4/h. The patient spent 115 minutes of total sleep time in the supine position and 0 minutes in non-supine. The supine AHI was 23.4/h versus a non-supine AHI of 0.0.  OXYGEN SATURATION & C02:  The Wake baseline 02 saturation was 92%, with the lowest being 88%. Time spent below 89% saturation equaled 0.5 minutes.  The arousals were noted as: 47 were spontaneous, 0 were associated with PLMs, 23 were associated with respiratory events.  The patient woke at midnight and was unable to resume sleep. She had 4 bathroom visits by that time.   EKG was in keeping with normal sinus rhythm (NSR).   IMPRESSION: The total sleep time was extremely short , only 115 minutes.  1. Obstructive Sleep Apnea (OSA) was controlled at an [place] setting for a Voltage of 2.0 V, but mild snoring was still present, reflected in an RDI of 6.2/h.  2. There was no hypoxemia and no irregular EKG noted.  3. INSOMNIA clearly persists.     RECOMMENDATIONS:    1. Increase setting of the device to 2.0 V after her next visit. The  device has been set for a window of 1.7V through 2.7V in which the patient can slowly advance the settings.    I certify that I have reviewed the entire raw data recording prior to the issuance of this report in accordance with the Standards of Accreditation of the American Academy of Sleep Medicine (AASM)      [name], MD Diplomat ABPN, American Board of Neurology  Diplomat, American Board of Sleep Medicine Market researcher, [employer] [facility] at [employer], [employer] [facility]

## 2020-07-22 ENCOUNTER — Encounter: Payer: Self-pay | Admitting: Neurology

## 2020-07-22 ENCOUNTER — Ambulatory Visit: Payer: Medicare Other | Admitting: Neurology

## 2020-07-22 ENCOUNTER — Other Ambulatory Visit: Payer: Self-pay

## 2020-07-22 VITALS — BP 107/54 | HR 58 | Ht 60.0 in | Wt 118.0 lb

## 2020-07-22 DIAGNOSIS — G4733 Obstructive sleep apnea (adult) (pediatric): Secondary | ICD-10-CM | POA: Diagnosis not present

## 2020-07-22 DIAGNOSIS — Z4549 Encounter for adjustment and management of other implanted nervous system device: Secondary | ICD-10-CM

## 2020-07-22 MED ORDER — LORAZEPAM 0.5 MG PO TABS: ORAL_TABLET | ORAL | 0 refills | Status: DC
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
## 2020-07-26 LAB — CUP PACEART REMOTE DEVICE CHECK
Date Time Interrogation Session: 20220210230409
Implantable Pulse Generator Implant Date: 20210512

## 2020-07-28 ENCOUNTER — Ambulatory Visit (INDEPENDENT_AMBULATORY_CARE_PROVIDER_SITE_OTHER): Payer: Medicare Other

## 2020-07-28 DIAGNOSIS — R55 Syncope and collapse: Secondary | ICD-10-CM | POA: Diagnosis not present

## 2020-07-31 NOTE — Progress Notes (Signed)
Carelink Summary Report / Loop Recorder 

## 2020-08-04 ENCOUNTER — Other Ambulatory Visit: Payer: Self-pay | Admitting: Cardiovascular Disease

## 2020-08-14 ENCOUNTER — Telehealth: Payer: Self-pay | Admitting: Infectious Diseases

## 2020-08-14 ENCOUNTER — Other Ambulatory Visit: Payer: Self-pay | Admitting: Infectious Diseases

## 2020-08-14 DIAGNOSIS — U071 COVID-19: Secondary | ICD-10-CM

## 2020-08-14 NOTE — Progress Notes (Signed)
I connected by phone with Elizabeth Walker on 08/14/2020 at 2:23 PM to discuss the potential use of a new treatment for mild to moderate COVID-19 viral infection in non-hospitalized patients.  This patient is a 75 y.o. female that meets the FDA criteria for Emergency Use Authorization of COVID monoclonal antibody sotrovimab.  Has a (+) direct SARS-CoV-2 viral test result  Has mild or moderate COVID-19   Is NOT hospitalized due to COVID-19  Is within 10 days of symptom onset  Has at least one of the high risk factor(s) for progression to severe COVID-19 and/or hospitalization as defined in EUA.  Specific high risk criteria : Older age (>/= 77 yo) and Diabetes   I have spoken and communicated the following to the patient or parent/caregiver regarding COVID monoclonal antibody treatment:  1. FDA has authorized the emergency use for the treatment of mild to moderate COVID-19 in adults and pediatric patients with positive results of direct SARS-CoV-2 viral testing who are 46 years of age and older weighing at least 40 kg, and who are at high risk for progressing to severe COVID-19 and/or hospitalization.  2. The significant known and potential risks and benefits of COVID monoclonal antibody, and the extent to which such potential risks and benefits are unknown.  3. Information on available alternative treatments and the risks and benefits of those alternatives, including clinical trials.  4. Patients treated with COVID monoclonal antibody should continue to self-isolate and use infection control measures (e.g., wear mask, isolate, social distance, avoid sharing personal items, clean and disinfect "high touch" surfaces, and frequent handwashing) according to CDC guidelines.   5. The patient or parent/caregiver has the option to accept or refuse COVID monoclonal antibody treatment.  After reviewing this information with the patient, the patient has agreed to receive one of the available covid 19  monoclonal antibodies and will be provided an appropriate fact sheet prior to infusion.    Elizabeth Madeira, NP 08/14/2020 2:23 PM

## 2020-08-14 NOTE — Telephone Encounter (Signed)
Called to discuss with patient about COVID-19 symptoms and the use of one of the available treatments for those with mild to moderate Covid symptoms and at a high risk of hospitalization.  Pt appears to qualify for outpatient treatment due to co-morbid conditions and/or a member of an at-risk group in accordance with the FDA Emergency Use Authorization.    Symptom onset: 2/25 Vaccinated: yes Booster? no Immunocompromised? no Qualifiers: age, HTN, diabetes  Scheduled for MAB on 3/4 @ 11:30 am. She will have MD office send her results to [name]. Primary problem for her is ongoing nausea/poor appetite and cough.   Elizabeth Walker

## 2020-08-15 ENCOUNTER — Ambulatory Visit (HOSPITAL_COMMUNITY)
Admission: RE | Admit: 2020-08-15 | Discharge: 2020-08-15 | Disposition: A | Discharge status: Home / Self Care | Payer: Medicare Other | Source: Ambulatory Visit | Attending: Pulmonary Disease | Admitting: Pulmonary Disease

## 2020-08-15 ENCOUNTER — Other Ambulatory Visit: Payer: Self-pay | Admitting: Adult Health

## 2020-08-15 DIAGNOSIS — U071 COVID-19: Secondary | ICD-10-CM

## 2020-08-15 MED ORDER — ALBUTEROL SULFATE HFA 108 (90 BASE) MCG/ACT IN AERS: 2.0000 | INHALATION_SPRAY | Freq: Once | RESPIRATORY_TRACT | Status: DC | PRN

## 2020-08-15 MED ORDER — ONDANSETRON 4 MG PO TBDP: 4.0000 mg | ORAL_TABLET | Freq: Three times a day (TID) | ORAL | 0 refills | Status: DC | PRN

## 2020-08-15 MED ORDER — PROMETHAZINE-CODEINE 6.25-10 MG/5ML PO SYRP: 5.0000 mL | ORAL_SOLUTION | Freq: Three times a day (TID) | ORAL | 0 refills | Status: DC | PRN

## 2020-08-15 MED ORDER — SODIUM CHLORIDE 0.9 % IV SOLN: INTRAVENOUS | Status: DC | PRN

## 2020-08-15 MED ORDER — ONDANSETRON HCL 4 MG/2ML IJ SOLN
4.0000 mg | Freq: Once | INTRAMUSCULAR | Status: AC
  Administered 2020-08-15: 4 mg via INTRAVENOUS
  Filled 2020-08-15: qty 2

## 2020-08-15 MED ORDER — SODIUM CHLORIDE 0.9 % IV BOLUS
500.0000 mL | Freq: Once | INTRAVENOUS | Status: AC
  Administered 2020-08-15: 500 mL via INTRAVENOUS

## 2020-08-15 MED ORDER — EPINEPHRINE 0.3 MG/0.3ML IJ SOAJ: 0.3000 mg | Freq: Once | INTRAMUSCULAR | Status: DC | PRN

## 2020-08-15 MED ORDER — SOTROVIMAB 500 MG/8ML IV SOLN
500.0000 mg | Freq: Once | INTRAVENOUS | Status: AC
  Administered 2020-08-15: 500 mg via INTRAVENOUS

## 2020-08-15 MED ORDER — DIPHENHYDRAMINE HCL 50 MG/ML IJ SOLN: 50.0000 mg | Freq: Once | INTRAMUSCULAR | Status: DC | PRN

## 2020-08-15 MED ORDER — METHYLPREDNISOLONE SODIUM SUCC 125 MG IJ SOLR: 125.0000 mg | Freq: Once | INTRAMUSCULAR | Status: DC | PRN

## 2020-08-15 MED ORDER — FAMOTIDINE IN NACL 20-0.9 MG/50ML-% IV SOLN: 20.0000 mg | Freq: Once | INTRAVENOUS | Status: DC | PRN

## 2020-08-15 MED FILL — PROMETHAZINE W/COD SYRUP: 6.25-10 | 8 days supply | Qty: 120 | Fill #0

## 2020-08-15 NOTE — Discharge Instructions (Signed)

## 2020-08-15 NOTE — Progress Notes (Addendum)
Patient reviewed Fact Sheet for Patients, Parents, and Caregivers for Emergency Use Authorization (EUA) of sotrovimab for the Treatment of Coronavirus. Patient also reviewed and is agreeable to the estimated cost of treatment. Patient is agreeable to proceed.     1145 Pt's BP low, 93/54 (65). Gardenia Phlegm, NP notified. Ordered patient 500cc bolus. Will recheck BP after completion. Pt denies s/s of hypotension including dizziness, lightheadedness, fatigue. WCTM.

## 2020-08-15 NOTE — Progress Notes (Signed)
Sent in cough syrup and zofran to help with patient symptoms as she requested.   Wilber Bihari, NP

## 2020-08-17 ENCOUNTER — Emergency Department (HOSPITAL_BASED_OUTPATIENT_CLINIC_OR_DEPARTMENT_OTHER): Payer: Medicare Other

## 2020-08-17 ENCOUNTER — Emergency Department (HOSPITAL_BASED_OUTPATIENT_CLINIC_OR_DEPARTMENT_OTHER)
Admission: EM | Admit: 2020-08-17 | Discharge: 2020-08-17 | Disposition: A | Discharge status: Home / Self Care | Payer: Medicare Other | Attending: Emergency Medicine | Admitting: Emergency Medicine

## 2020-08-17 ENCOUNTER — Encounter (HOSPITAL_BASED_OUTPATIENT_CLINIC_OR_DEPARTMENT_OTHER): Payer: Self-pay | Admitting: Emergency Medicine

## 2020-08-17 ENCOUNTER — Other Ambulatory Visit: Payer: Self-pay

## 2020-08-17 DIAGNOSIS — J45909 Unspecified asthma, uncomplicated: Secondary | ICD-10-CM | POA: Insufficient documentation

## 2020-08-17 DIAGNOSIS — E876 Hypokalemia: Secondary | ICD-10-CM

## 2020-08-17 DIAGNOSIS — U071 COVID-19: Secondary | ICD-10-CM | POA: Insufficient documentation

## 2020-08-17 DIAGNOSIS — I10 Essential (primary) hypertension: Secondary | ICD-10-CM | POA: Diagnosis not present

## 2020-08-17 DIAGNOSIS — Z79899 Other long term (current) drug therapy: Secondary | ICD-10-CM | POA: Diagnosis not present

## 2020-08-17 DIAGNOSIS — E039 Hypothyroidism, unspecified: Secondary | ICD-10-CM | POA: Diagnosis not present

## 2020-08-17 DIAGNOSIS — J329 Chronic sinusitis, unspecified: Secondary | ICD-10-CM

## 2020-08-17 DIAGNOSIS — R059 Cough, unspecified: Secondary | ICD-10-CM | POA: Diagnosis present

## 2020-08-17 LAB — CBC WITH DIFFERENTIAL/PLATELET
Abs Immature Granulocytes: 0.04 10*3/uL (ref 0.00–0.07)
Basophils Absolute: 0 10*3/uL (ref 0.0–0.1)
Basophils Relative: 0 %
Eosinophils Absolute: 0.3 10*3/uL (ref 0.0–0.5)
Eosinophils Relative: 2 %
HCT: 37.7 % (ref 36.0–46.0)
Hemoglobin: 12.4 g/dL (ref 12.0–15.0)
Immature Granulocytes: 0 %
Lymphocytes Relative: 14 %
Lymphs Abs: 2 10*3/uL (ref 0.7–4.0)
MCH: 28.2 pg (ref 26.0–34.0)
MCHC: 32.9 g/dL (ref 30.0–36.0)
MCV: 85.9 fL (ref 80.0–100.0)
Monocytes Absolute: 0.8 10*3/uL (ref 0.1–1.0)
Monocytes Relative: 6 %
Neutro Abs: 10.6 10*3/uL — ABNORMAL HIGH (ref 1.7–7.7)
Neutrophils Relative %: 78 %
Platelets: 263 10*3/uL (ref 150–400)
RBC: 4.39 MIL/uL (ref 3.87–5.11)
RDW: 12.6 % (ref 11.5–15.5)
WBC: 13.7 10*3/uL — ABNORMAL HIGH (ref 4.0–10.5)
nRBC: 0 % (ref 0.0–0.2)

## 2020-08-17 LAB — COMPREHENSIVE METABOLIC PANEL
ALT: 11 U/L (ref 0–44)
AST: 16 U/L (ref 15–41)
Albumin: 3.3 g/dL — ABNORMAL LOW (ref 3.5–5.0)
Alkaline Phosphatase: 55 U/L (ref 38–126)
Anion gap: 10 (ref 5–15)
BUN: 22 mg/dL (ref 8–23)
CO2: 27 mmol/L (ref 22–32)
Calcium: 8.7 mg/dL — ABNORMAL LOW (ref 8.9–10.3)
Chloride: 103 mmol/L (ref 98–111)
Creatinine, Ser: 1.13 mg/dL — ABNORMAL HIGH (ref 0.44–1.00)
GFR, Estimated: 50 mL/min — ABNORMAL LOW (ref >60)
Glucose, Bld: 109 mg/dL — ABNORMAL HIGH (ref 70–99)
Potassium: 2.7 mmol/L — CL (ref 3.5–5.1)
Sodium: 140 mmol/L (ref 135–145)
Total Bilirubin: 0.6 mg/dL (ref 0.3–1.2)
Total Protein: 6.5 g/dL (ref 6.5–8.1)

## 2020-08-17 LAB — I-STAT VENOUS BLOOD GAS, ED
Acid-Base Excess: 4 mmol/L — ABNORMAL HIGH (ref 0.0–2.0)
Bicarbonate: 30.2 mmol/L — ABNORMAL HIGH (ref 20.0–28.0)
Calcium, Ion: 1.18 mmol/L (ref 1.15–1.40)
HCT: 37 % (ref 36.0–46.0)
Hemoglobin: 12.6 g/dL (ref 12.0–15.0)
O2 Saturation: 44 %
Patient temperature: 97.6
Potassium: 2.7 mmol/L — CL (ref 3.5–5.1)
Sodium: 143 mmol/L (ref 135–145)
TCO2: 32 mmol/L (ref 22–32)
pCO2, Ven: 51.7 mmHg (ref 44.0–60.0)
pH, Ven: 7.372 (ref 7.250–7.430)
pO2, Ven: 25 mmHg — CL (ref 32.0–45.0)

## 2020-08-17 MED ORDER — AMOXICILLIN-POT CLAVULANATE 875-125 MG PO TABS: 1.0000 | ORAL_TABLET | Freq: Two times a day (BID) | ORAL | 0 refills | Status: DC

## 2020-08-17 MED ORDER — POTASSIUM CHLORIDE CRYS ER 20 MEQ PO TBCR: 40.0000 meq | EXTENDED_RELEASE_TABLET | Freq: Every day | ORAL | 0 refills | Status: DC

## 2020-08-17 MED ORDER — ALBUTEROL SULFATE HFA 108 (90 BASE) MCG/ACT IN AERS
2.0000 | INHALATION_SPRAY | Freq: Once | RESPIRATORY_TRACT | Status: AC
  Administered 2020-08-17: 2 via RESPIRATORY_TRACT
  Filled 2020-08-17: qty 6.7

## 2020-08-17 MED ORDER — POTASSIUM CHLORIDE 10 MEQ/100ML IV SOLN
10.0000 meq | INTRAVENOUS | Status: AC
  Administered 2020-08-17 (×2): 10 meq via INTRAVENOUS
  Filled 2020-08-17 (×2): qty 100

## 2020-08-17 MED ORDER — POTASSIUM CHLORIDE CRYS ER 20 MEQ PO TBCR
40.0000 meq | EXTENDED_RELEASE_TABLET | Freq: Once | ORAL | Status: AC
  Administered 2020-08-17: 40 meq via ORAL
  Filled 2020-08-17: qty 2

## 2020-08-17 MED ORDER — AMOXICILLIN-POT CLAVULANATE 875-125 MG PO TABS
1.0000 | ORAL_TABLET | Freq: Once | ORAL | Status: AC
  Administered 2020-08-17: 1 via ORAL
  Filled 2020-08-17: qty 1

## 2020-08-17 NOTE — ED Provider Notes (Signed)
[facility] Provider Note   CSN: [id number] Arrival date & time: 08/17/20  1428     History Chief Complaint  Patient presents with  . Cough    Elizabeth Walker is a 75 y.o. female hx of GERD, HTN, HL, here with cough. Patient tested positive for COVID on 2/28. She did receive two doses of vaccines but not booster. Received monoclonal antibody infusion on 3/4. Patient is also on molpunivir. Patient has been having productive cough with greenish sputum. Also sinus drainage with greenish discharge. States that she had sinusitis before with similar symptoms that required antibiotics. Denies any fevers. Not on oxygen at home.   The history is provided by the patient.       Past Medical History:  Diagnosis Date  . Anxiety   . Arthritis   . Depression   . Esophageal spasm    a. pt reports prior GI study demonstrating this.  Marland Kitchen GERD (gastroesophageal reflux disease)   . Hypercholesteremia   . Hypertension   . Hypothyroid   . Migraine   . Osteoporosis   . Pneumonia   . PONV (postoperative nausea and vomiting)   . Sinusitis   . Sleep apnea    a. intolerant to CPAP.  Marland Kitchen Tachycardia    a. suspected due to sinus tach. Event monitor/echo unremarkable.    Patient Active Problem List   Diagnosis Date Noted  . Encounter for adjustment and management of other implanted nervous system device 07/22/2020  . Nonspecific abnormal response to nerve stimulation 07/21/2020  . Sleep disorder, circadian, delayed sleep phase type 07/01/2020  . Dizziness 10/03/2019  . OSA (obstructive sleep apnea) 08/20/2019  . Slow heart rate 05/24/2019  . Obstructive sleep apnea 03/09/2019  . Chronic insomnia 08/29/2017  . 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  . Sleep apnea treated with nocturnal BiPAP 08/29/2017  . Excessive daytime sleepiness 08/29/2017  . Intolerance of continuous positive airway pressure (CPAP) ventilation 08/29/2017  . Sinusitis, chronic 01/02/2015  .  Cough variant asthma 06/18/2014  . Dyspnea on exertion 05/13/2014  . Hypercholesteremia   . Atypical chest pain 04/08/2014  . Asthma with acute exacerbation 04/03/2014  . PNA (pneumonia) 04/02/2014  . Hypokalemia 04/02/2014  . CAP (community acquired pneumonia) 04/02/2014  . Hypothyroidism 04/10/2013  . Tachycardia 04/22/2011  . HYPERLIPIDEMIA 12/04/2007  . Essential hypertension 12/04/2007  . ALLERGIC RHINITIS 12/04/2007  . ASTHMA 12/04/2007  . HEADACHE, CHRONIC 12/04/2007  . Cough 12/04/2007    Past Surgical History:  Procedure Laterality Date  . BREAST ENHANCEMENT SURGERY    . cataracts  2016  . CERVICAL SPINE SURGERY     C5-7 ACDF 12/23/2015  . [place]  . COMBINED AUGMENTATION MAMMAPLASTY AND ABDOMINOPLASTY    . DRUG INDUCED ENDOSCOPY N/A 07/21/2018   Procedure: DRUG INDUCED ENDOSCOPY;  Surgeon: Jerrell Belfast, MD;  Location: [place];  Service: ENT;  Laterality: N/A;  Drug induced sleep endoscopy  . HEMORROIDECTOMY  2009  . IMPLANTATION OF HYPOGLOSSAL NERVE STIMULATOR  03/09/2019   [id number]  . IMPLANTATION OF HYPOGLOSSAL NERVE STIMULATOR Right 03/09/2019   Procedure: IMPLANTATION OF HYPOGLOSSAL NERVE STIMULATOR;  Surgeon: Jerrell Belfast, MD;  Location: [place];  Service: ENT;  Laterality: Right;     OB History   No obstetric history on file.     Family History  Problem Relation Age of Onset  . Coronary artery disease Other   . CAD Father        died  at 42 - massive heart attack  . Emphysema Father        smoked  . Asthma Father   . Heart disease Father   . Heart disease Sister        had open heart surgery age 78  . Diabetes Sister   . Asthma Brother   . CAD Brother        had open heart surgery at 78  . Kidney failure Brother   . Stroke Sister   . Sjogren's syndrome Mother     Social History   Tobacco Use  . Smoking status: Never Smoker  . Smokeless tobacco: Never Used  Vaping Use  . Vaping Use: Never used   Substance Use Topics  . Alcohol use: Yes    Comment: rare wine  . Drug use: No    Home Medications Prior to Admission medications   Medication Sig Start Date End Date Taking? Authorizing Provider  chlorthalidone (HYGROTON) 25 MG tablet Take 25 mg by mouth daily. 05/21/20  Yes [provider]  irbesartan (AVAPRO) 300 MG tablet Take 300 mg by mouth every morning.    Yes [provider]  acetaminophen (TYLENOL) 500 MG tablet Take 500 mg by mouth every 6 (six) hours as needed (for pain.).     [provider]  atorvastatin (LIPITOR) 40 MG tablet TAKE ONE TABLET BY MOUTH ONE TIME DAILY  Patient taking differently: Take 40 mg by mouth daily. 04/09/14   Elayne Snare, MD  BYSTOLIC 2.5 MG tablet Take 1 tablet by mouth once daily 08/04/20   Croitoru, Mihai, MD  denosumab (PROLIA) 60 MG/ML SOSY injection Inject 60 mg into the skin every 6 (six) months.    [provider]  ergocalciferol (VITAMIN D2) 50000 UNITS capsule Take 50,000 Units by mouth every Saturday.    [provider]  fluticasone (FLONASE) 50 MCG/ACT nasal spray Place 1-2 sprays into both nostrils daily as needed for allergies or rhinitis.    [provider]  levothyroxine (SYNTHROID, LEVOTHROID) 100 MCG tablet TAKE 1 TABLET BY MOUTH ONCE DAILY Patient taking differently: Take 100 mcg by mouth daily before breakfast. 07/15/17   Elayne Snare, MD  LORazepam (ATIVAN) 0.5 MG tablet For insomnia. 07/22/20   Dohmeier, Asencion Partridge, MD  meclizine (ANTIVERT) 25 MG tablet Take 1 tablet (25 mg total) by mouth 3 (three) times daily as needed for dizziness. 06/03/18   Isla Pence, MD  omeprazole (PRILOSEC) 20 MG capsule Take 20 mg by mouth daily before breakfast.    [provider]  ondansetron (ZOFRAN ODT) 4 MG disintegrating tablet Take 1 tablet (4 mg total) by mouth every 8 (eight) hours as needed. 06/03/18   Isla Pence, MD  ondansetron (ZOFRAN ODT) 4 MG disintegrating tablet Take 1 tablet  (4 mg total) by mouth every 8 (eight) hours as needed for nausea or vomiting. 08/15/20   Gardenia Phlegm, NP  Probiotic Product (ACIDOPHILUS/GOAT MILK) CAPS Take by mouth.    [provider]  promethazine-codeine (PHENERGAN WITH CODEINE) 6.25-10 MG/5ML syrup Take 5 mLs by mouth every 8 (eight) hours as needed for cough. 08/15/20   Gardenia Phlegm, NP  Respiratory Therapy Supplies (FLUTTER) DEVI Use as directed 01/02/15   Tanda Rockers, MD    Allergies    Pollen extract and Codeine  Review of Systems   Review of Systems  Respiratory: Positive for cough.   All other systems reviewed and are negative.   Physical Exam Updated Vital Signs BP Marland Kitchen)  141/60   Pulse (!) 50   Temp 97.9 F (36.6 C) (Oral)   Resp 10   Ht 5' (1.524 m)   Wt 53.5 kg   SpO2 100%   BMI 23.05 kg/m   Physical Exam Vitals and nursing note reviewed.  HENT:     Head: Normocephalic.     Nose: Nose normal.     Mouth/Throat:     Mouth: Mucous membranes are moist.  Eyes:     Extraocular Movements: Extraocular movements intact.     Pupils: Pupils are equal, round, and reactive to light.  Cardiovascular:     Rate and Rhythm: Normal rate and regular rhythm.     Pulses: Normal pulses.     Heart sounds: Normal heart sounds.  Pulmonary:     Comments: Diminished bilaterally, no obvious wheezing  Abdominal:     General: Abdomen is flat.     Palpations: Abdomen is soft.  Musculoskeletal:        General: No swelling. Normal range of motion.     Cervical back: Normal range of motion and neck supple.  Skin:    General: Skin is warm.     Capillary Refill: Capillary refill takes less than 2 seconds.  Neurological:     General: No focal deficit present.     Mental Status: She is alert and oriented to person, place, and time.  Psychiatric:        Mood and Affect: Mood normal.        Behavior: Behavior normal.     ED Results / Procedures / Treatments   Labs (all labs ordered are listed, but  only abnormal results are displayed) Labs Reviewed  CBC WITH DIFFERENTIAL/PLATELET - Abnormal; Notable for the following components:      Result Value   WBC 13.7 (*)    Neutro Abs 10.6 (*)    All other components within normal limits  COMPREHENSIVE METABOLIC PANEL - Abnormal; Notable for the following components:   Potassium 2.7 (*)    Glucose, Bld 109 (*)    Creatinine, Ser 1.13 (*)    Calcium 8.7 (*)    Albumin 3.3 (*)    GFR, Estimated 50 (*)    All other components within normal limits  I-STAT VENOUS BLOOD GAS, ED - Abnormal; Notable for the following components:   pO2, Ven 25.0 (*)    Bicarbonate 30.2 (*)    Acid-Base Excess 4.0 (*)    Potassium 2.7 (*)    All other components within normal limits    EKG None  Radiology DG Chest Port 1 View  Result Date: 08/17/2020 CLINICAL DATA:  Cough x1 week. EXAM: PORTABLE CHEST 1 VIEW COMPARISON:  March 09, 2019 FINDINGS: A radiopaque stimulator and associated stimulator wire are seen. The heart size and mediastinal contours are within normal limits. Both lungs are clear. A radiopaque fixation plate and screws are seen overlying the lower cervical spine. The visualized skeletal structures are otherwise unremarkable. IMPRESSION: No active disease. Electronically Signed   By: Virgina Norfolk M.D.   On: 08/17/2020 16:15    Procedures Procedures   Medications Ordered in ED Medications  potassium chloride 10 mEq in 100 mL IVPB (10 mEq Intravenous New Bag/Given 08/17/20 1901)  albuterol (VENTOLIN HFA) 108 (90 Base) MCG/ACT inhaler 2 puff (2 puffs Inhalation Given 08/17/20 1554)  potassium chloride SA (KLOR-CON) CR tablet 40 mEq (40 mEq Oral Given 08/17/20 1737)  amoxicillin-clavulanate (AUGMENTIN) 875-125 MG per tablet 1 tablet (1 tablet Oral Given  08/17/20 1737)    ED Course  I have reviewed the triage vital signs and the nursing notes.  Pertinent labs & imaging results that were available during my care of the patient were reviewed by  me and considered in my medical decision making (see chart for details).    MDM Rules/Calculators/A&P                         EVALYNE CORTOPASSI is a 75 y.o. female here with cough. COVID positive on 2/28. Received antibody infusion already. Not hypoxic. Likely pneumonia vs cough from COVID vs sinusitis. Will get cbc, cmp, CXR. Patient has no oxygen requirement currently   7:22 PM Labs showed K 2.7, supplemented. CXR clear. Will dc home with potassium, augmentin for sinusitis.   Final Clinical Impression(s) / ED Diagnoses Final diagnoses:  None    Rx / DC Orders ED Discharge Orders    None       Drenda Freeze, MD 08/17/20 1924

## 2020-08-17 NOTE — Discharge Instructions (Signed)
Take augmentin twice daily for a week   Take potassium pills 40 meq daily for 5 days   See your doctor for follow up   Return to ER if you have worse trouble breathing, fever, oxygen less than 90, vomiting.

## 2020-08-17 NOTE — ED Notes (Signed)
Patient ambulated ith pulse ox. No SOB noted, SAT 98%

## 2020-08-17 NOTE — ED Triage Notes (Addendum)
Pt reports productive cough x 1 week. Pt reports husband with similar symptoms. Pt and husband tested positive for COVID on 08/11/2020. Pt had COVID Vaccine x 2.

## 2020-08-26 ENCOUNTER — Telehealth: Payer: Self-pay

## 2020-08-26 NOTE — Telephone Encounter (Signed)
The pt called to get the app. I called [employer] to get additional help.

## 2020-08-28 LAB — CUP PACEART REMOTE DEVICE CHECK
Date Time Interrogation Session: 20220315230537
Implantable Pulse Generator Implant Date: 20210512

## 2020-09-01 ENCOUNTER — Ambulatory Visit (INDEPENDENT_AMBULATORY_CARE_PROVIDER_SITE_OTHER): Payer: Medicare Other

## 2020-09-01 DIAGNOSIS — R55 Syncope and collapse: Secondary | ICD-10-CM

## 2020-09-08 NOTE — Progress Notes (Signed)
Carelink Summary Report / Loop Recorder 

## 2020-09-29 ENCOUNTER — Ambulatory Visit (INDEPENDENT_AMBULATORY_CARE_PROVIDER_SITE_OTHER): Payer: Medicare Other

## 2020-09-29 DIAGNOSIS — R55 Syncope and collapse: Secondary | ICD-10-CM

## 2020-09-30 ENCOUNTER — Ambulatory Visit: Payer: Medicare Other | Admitting: Pulmonary Disease

## 2020-10-01 LAB — CUP PACEART REMOTE DEVICE CHECK
Date Time Interrogation Session: 20220418080903
Implantable Pulse Generator Implant Date: 20210512

## 2020-10-15 NOTE — Progress Notes (Signed)
Carelink Summary Report / Loop Recorder 

## 2020-11-03 ENCOUNTER — Ambulatory Visit (INDEPENDENT_AMBULATORY_CARE_PROVIDER_SITE_OTHER): Payer: Medicare Other

## 2020-11-03 DIAGNOSIS — R55 Syncope and collapse: Secondary | ICD-10-CM

## 2020-11-04 LAB — CUP PACEART REMOTE DEVICE CHECK
Date Time Interrogation Session: 20220520230435
Implantable Pulse Generator Implant Date: 20210512

## 2020-11-18 ENCOUNTER — Other Ambulatory Visit: Payer: Self-pay | Admitting: Internal Medicine

## 2020-11-18 DIAGNOSIS — Z1231 Encounter for screening mammogram for malignant neoplasm of breast: Secondary | ICD-10-CM

## 2020-11-24 NOTE — Progress Notes (Signed)
Carelink Summary Report / Loop Recorder 

## 2020-11-30 ENCOUNTER — Other Ambulatory Visit: Payer: Self-pay | Admitting: Cardiovascular Disease

## 2020-12-02 ENCOUNTER — Telehealth: Payer: Self-pay | Admitting: Cardiovascular Disease

## 2020-12-02 MED ORDER — BYSTOLIC 2.5 MG PO TABS: ORAL_TABLET | ORAL | 0 refills | Status: DC

## 2020-12-02 NOTE — Telephone Encounter (Signed)
Returned call to patient and advised that the Bystolic refill was not denied, it was printed instead of electronically sent to pharmacy. I advised that I will send the prescription to pharmacy now and it should be available later today. She has an appointment with Dr. Gwenlyn Found on July 21. She thanked me for the call.

## 2020-12-02 NOTE — Telephone Encounter (Signed)
New Message:    Pt wanted to know why her Bystolic was denied. She says she have an appointment for 12-31-20, the first available appointment that we had.

## 2020-12-03 ENCOUNTER — Other Ambulatory Visit: Payer: Self-pay | Admitting: Cardiovascular Disease

## 2020-12-04 LAB — CUP PACEART REMOTE DEVICE CHECK
Date Time Interrogation Session: 20220622230731
Implantable Pulse Generator Implant Date: 20210512

## 2020-12-08 ENCOUNTER — Ambulatory Visit (INDEPENDENT_AMBULATORY_CARE_PROVIDER_SITE_OTHER): Payer: Medicare Other

## 2020-12-08 DIAGNOSIS — R55 Syncope and collapse: Secondary | ICD-10-CM | POA: Diagnosis not present

## 2020-12-25 NOTE — Progress Notes (Signed)
Carelink Summary Report / Loop Recorder 

## 2020-12-31 ENCOUNTER — Ambulatory Visit: Payer: Medicare Other | Admitting: Neurology

## 2020-12-31 ENCOUNTER — Other Ambulatory Visit: Payer: Self-pay

## 2020-12-31 ENCOUNTER — Encounter: Payer: Self-pay | Admitting: Cardiovascular Disease

## 2020-12-31 ENCOUNTER — Ambulatory Visit: Payer: Medicare Other | Admitting: Cardiovascular Disease

## 2020-12-31 VITALS — BP 138/64 | HR 53 | Ht 60.5 in | Wt 110.4 lb

## 2020-12-31 DIAGNOSIS — G4733 Obstructive sleep apnea (adult) (pediatric): Secondary | ICD-10-CM

## 2020-12-31 DIAGNOSIS — R0789 Other chest pain: Secondary | ICD-10-CM

## 2020-12-31 DIAGNOSIS — I10 Essential (primary) hypertension: Secondary | ICD-10-CM

## 2020-12-31 DIAGNOSIS — R42 Dizziness and giddiness: Secondary | ICD-10-CM | POA: Diagnosis not present

## 2020-12-31 NOTE — Patient Instructions (Signed)

## 2020-12-31 NOTE — Assessment & Plan Note (Signed)
History of dizziness/presyncope in the past with bradycardia.  Bradycardia improved with adjustment of her beta-blocker blocker.  A event recorder did show episodes of sinus bradycardia down to the 30s.  Dr. Sallyanne Kuster implanted a loop recorder 10/24/2019 which has not shown any arrhythmias.  Her episodes of dizziness have resolved since I last saw her.

## 2020-12-31 NOTE — Progress Notes (Signed)
12/31/2020 Ronalee Red   06-11-1946  [id number]  Primary Physician Crist Infante, MD Primary Cardiologist: Lorretta Harp MD Lupe Carney, [place]  HPI:  Elizabeth Walker is a 75 y.o.  thin and fit appearing married Caucasian female mother of 2, grandmother of 7 grandchildren who worked last at [employer].  She was referred by her PCP, Dr. Joylene Draft for cardiovascular valuation because of bradycardia.  She has seen Dr. Rayann Heman in the past 08/20/2011 for palpitations.    I last saw her in the office 10/03/2019.  Her cardiac risk factor profile is notable for treated hypertension hyperlipidemia.  Her father did die of a myocardial infarction at age 1 and her mother had bypass surgery and valve replacement.  History of bypass surgery as well.  She had a negative Myoview stress test back in 2015 and a recent coronary calcium score of 0.  She does complain of some shortness of breath but denies chest pain.  She has been on multiple antihypertensive medications but is intolerant to calcium channel blockers causing peripheral edema.  She has been on hydralazine as well.  Currently she is on Bystolic, Avapro and chlorthalidone.  Her heart rate runs in the 40s.  She complains of chronic fatigue.  She also has episodes of presyncope.   A 2D echo was performed that was essentially normal.  Event monitor was also placed that revealed episodes of sinus bradycardia.  Based on this I did decrease her Bystolic from 5 to 2.5 mg a day which has resulted in an increase in her heart rate up in the 60 range.  She continues to have episodic presyncope although this did not occur while she was wearing her 30-day event monitor.  She also complains of some dyspnea on exertion.  She has had atypical chest pain in the past and had a coronary calcium score performed 05/01/2019 which was 0.3.  Since I saw her a year ago she did have a loop recorder implanted by Dr. Sallyanne Kuster 10/24/2019 which has not shown any arrhythmias.   Episodes of dizziness have resolved.  She no longer has atypical chest pain either.   Current Meds  Medication Sig   acetaminophen (TYLENOL) 500 MG tablet Take 500 mg by mouth every 6 (six) hours as needed (for pain.).    atorvastatin (LIPITOR) 40 MG tablet TAKE ONE TABLET BY MOUTH ONE TIME DAILY  (Patient taking differently: Take 40 mg by mouth daily.)   BYSTOLIC 2.5 MG tablet TAKE 1 TABLET BY MOUTH ONCE DAILY * please keep upcoming appointment for further refills   chlorthalidone (HYGROTON) 25 MG tablet Take 25 mg by mouth daily.   denosumab (PROLIA) 60 MG/ML SOSY injection Inject 60 mg into the skin every 6 (six) months.   ergocalciferol (VITAMIN D2) 50000 UNITS capsule Take 50,000 Units by mouth every Saturday.   fluticasone (FLONASE) 50 MCG/ACT nasal spray Place 1-2 sprays into both nostrils daily as needed for allergies or rhinitis.   irbesartan (AVAPRO) 300 MG tablet Take 300 mg by mouth every morning.    levothyroxine (SYNTHROID, LEVOTHROID) 100 MCG tablet TAKE 1 TABLET BY MOUTH ONCE DAILY (Patient taking differently: Take 100 mcg by mouth daily before breakfast.)   LORazepam (ATIVAN) 0.5 MG tablet For insomnia.   meclizine (ANTIVERT) 25 MG tablet Take 1 tablet (25 mg total) by mouth 3 (three) times daily as needed for dizziness.   omeprazole (PRILOSEC) 20 MG capsule Take 20 mg by mouth daily before  breakfast.   Probiotic Product (ACIDOPHILUS/GOAT MILK) CAPS Take by mouth.   Current Facility-Administered Medications for the 12/31/20 encounter (Office Visit) with Lorretta Harp, MD  Medication   lidocaine-EPINEPHrine (XYLOCAINE W/EPI) 1 %-1:100000 (with pres) injection 10 mL     Allergies  Allergen Reactions   Pollen Extract Other (See Comments)    Stuffy nose   Codeine Nausea And Vomiting    Social History   Socioeconomic History   Marital status: Married    Spouse name: Not on file   Number of children: Not on file   Years of education: Not on file   Highest education  level: Not on file  Occupational History   Not on file  Tobacco Use   Smoking status: Never   Smokeless tobacco: Never  Vaping Use   Vaping Use: Never used  Substance and Sexual Activity   Alcohol use: Yes    Comment: rare wine   Drug use: No   Sexual activity: Not on file  Other Topics Concern   Not on file  Social History Narrative   Pt lives in [place] with spouse.  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   Social Determinants of Health   Financial Resource Strain: Not on file  Food Insecurity: Not on file  Transportation Needs: Not on file  Physical Activity: Not on file  Stress: Not on file  Social Connections: Not on file  Intimate Partner Violence: Not on file     Review of Systems: General: negative for chills, fever, night sweats or weight changes.  Cardiovascular: negative for chest pain, dyspnea on exertion, edema, orthopnea, palpitations, paroxysmal nocturnal dyspnea or shortness of breath Dermatological: negative for rash Respiratory: negative for cough or wheezing Urologic: negative for hematuria Abdominal: negative for nausea, vomiting, diarrhea, bright red blood per rectum, melena, or hematemesis Neurologic: negative for visual changes, syncope, or dizziness All other systems reviewed and are otherwise negative except as noted above.    Blood pressure 138/64, pulse (!) 53, height 5' 0.5" (1.537 m), weight 110 lb 6.4 oz (50.1 kg), SpO2 98 %.  General appearance: alert and no distress Neck: no adenopathy, no carotid bruit, no JVD, supple, symmetrical, trachea midline, and thyroid not enlarged, symmetric, no tenderness/mass/nodules Lungs: clear to auscultation bilaterally Heart: regular rate and rhythm, S1, S2 normal, no murmur, click, rub or gallop Extremities: extremities normal, atraumatic, no cyanosis or edema Pulses: 2+ and symmetric Skin: Skin color, texture, turgor normal. No rashes or lesions Neurologic: Grossly normal  EKG sinus bradycardia  53 without ST or T wave changes.  I personally reviewed this EKG.  ASSESSMENT AND PLAN:   HYPERLIPIDEMIA History of hyperlipidemia on statin therapy with lipid profile performed 04/23/2020 revealing total cholesterol 174, LDL 99 and HDL 60.  Essential hypertension History of essential hypertension with blood pressure measured today 138/64.  She is on low-dose Bystolic and Avapro.  Atypical chest pain History of atypical chest pain with a negative Myoview stress test performed in 2015 with a coronary calcium score of 0 at that time.  She has had no recurrent symptoms.  Dizziness History of dizziness/presyncope in the past with bradycardia.  Bradycardia improved with adjustment of her beta-blocker blocker.  A event recorder did show episodes of sinus bradycardia down to the 30s.  Dr. Sallyanne Kuster implanted a loop recorder 10/24/2019 which has not shown any arrhythmias.  Her episodes of dizziness have resolved since I last saw her.     Lorretta Harp MD FACP,FACC,FAHA, [facility] 12/31/2020 12:20  PM

## 2020-12-31 NOTE — Assessment & Plan Note (Signed)
History of essential hypertension with blood pressure measured today 138/64.  She is on low-dose Bystolic and Avapro.

## 2020-12-31 NOTE — Assessment & Plan Note (Signed)
History of atypical chest pain with a negative Myoview stress test performed in 2015 with a coronary calcium score of 0 at that time.  She has had no recurrent symptoms.

## 2020-12-31 NOTE — Assessment & Plan Note (Signed)
History of hyperlipidemia on statin therapy with lipid profile performed 04/23/2020 revealing total cholesterol 174, LDL 99 and HDL 60.

## 2021-01-08 ENCOUNTER — Ambulatory Visit (INDEPENDENT_AMBULATORY_CARE_PROVIDER_SITE_OTHER): Payer: Medicare Other

## 2021-01-08 DIAGNOSIS — R55 Syncope and collapse: Secondary | ICD-10-CM | POA: Diagnosis not present

## 2021-01-08 LAB — CUP PACEART REMOTE DEVICE CHECK
Date Time Interrogation Session: 20220725230506
Implantable Pulse Generator Implant Date: 20210512

## 2021-01-12 ENCOUNTER — Other Ambulatory Visit: Payer: Self-pay | Admitting: Internal Medicine

## 2021-01-12 ENCOUNTER — Other Ambulatory Visit: Payer: Self-pay

## 2021-01-12 ENCOUNTER — Ambulatory Visit
Admission: RE | Admit: 2021-01-12 | Discharge: 2021-01-12 | Disposition: A | Discharge status: Home / Self Care | Payer: Medicare Other | Source: Ambulatory Visit | Attending: Internal Medicine | Admitting: Internal Medicine

## 2021-01-12 DIAGNOSIS — Z1231 Encounter for screening mammogram for malignant neoplasm of breast: Secondary | ICD-10-CM

## 2021-01-12 DIAGNOSIS — N63 Unspecified lump in unspecified breast: Secondary | ICD-10-CM

## 2021-02-03 NOTE — Progress Notes (Signed)
Carelink Summary Report / Loop Recorder 

## 2021-02-10 ENCOUNTER — Ambulatory Visit (INDEPENDENT_AMBULATORY_CARE_PROVIDER_SITE_OTHER): Payer: Medicare Other

## 2021-02-10 DIAGNOSIS — R55 Syncope and collapse: Secondary | ICD-10-CM

## 2021-02-11 LAB — CUP PACEART REMOTE DEVICE CHECK
Date Time Interrogation Session: 20220831100126
Implantable Pulse Generator Implant Date: 20210512

## 2021-02-13 ENCOUNTER — Ambulatory Visit
Admission: RE | Admit: 2021-02-13 | Discharge: 2021-02-13 | Disposition: A | Discharge status: Home / Self Care | Payer: Medicare Other | Source: Ambulatory Visit | Attending: Internal Medicine | Admitting: Internal Medicine

## 2021-02-13 ENCOUNTER — Ambulatory Visit: Payer: Medicare Other

## 2021-02-13 ENCOUNTER — Other Ambulatory Visit: Payer: Self-pay | Admitting: Internal Medicine

## 2021-02-13 ENCOUNTER — Other Ambulatory Visit: Payer: Medicare Other

## 2021-02-13 ENCOUNTER — Other Ambulatory Visit: Payer: Self-pay

## 2021-02-13 DIAGNOSIS — N63 Unspecified lump in unspecified breast: Secondary | ICD-10-CM

## 2021-02-23 NOTE — Progress Notes (Signed)
Carelink Summary Report / Loop Recorder 

## 2021-03-12 ENCOUNTER — Other Ambulatory Visit: Payer: Self-pay | Admitting: Cardiovascular Disease

## 2021-03-16 ENCOUNTER — Ambulatory Visit (INDEPENDENT_AMBULATORY_CARE_PROVIDER_SITE_OTHER): Payer: Medicare Other

## 2021-03-16 DIAGNOSIS — R55 Syncope and collapse: Secondary | ICD-10-CM

## 2021-03-16 LAB — CUP PACEART REMOTE DEVICE CHECK
Date Time Interrogation Session: 20220929230757
Implantable Pulse Generator Implant Date: 20210512

## 2021-03-23 NOTE — Progress Notes (Signed)
Carelink Summary Report / Loop Recorder 

## 2021-04-15 LAB — CUP PACEART REMOTE DEVICE CHECK
Date Time Interrogation Session: 20221101230440
Implantable Pulse Generator Implant Date: 20210512

## 2021-04-15 NOTE — Progress Notes (Addendum)
Inspire visit - see media tap.

## 2021-04-20 ENCOUNTER — Ambulatory Visit (INDEPENDENT_AMBULATORY_CARE_PROVIDER_SITE_OTHER): Payer: Medicare Other

## 2021-04-20 DIAGNOSIS — R55 Syncope and collapse: Secondary | ICD-10-CM

## 2021-04-23 NOTE — Progress Notes (Signed)
Carelink Summary Report / Loop Recorder 

## 2021-05-04 ENCOUNTER — Other Ambulatory Visit: Payer: Self-pay

## 2021-05-04 ENCOUNTER — Encounter: Payer: Self-pay | Admitting: Neurology

## 2021-05-04 ENCOUNTER — Ambulatory Visit: Payer: Medicare Other | Admitting: Neurology

## 2021-05-04 VITALS — BP 143/67 | HR 51 | Ht 60.5 in | Wt 103.0 lb

## 2021-05-04 DIAGNOSIS — Z4549 Encounter for adjustment and management of other implanted nervous system device: Secondary | ICD-10-CM

## 2021-05-04 DIAGNOSIS — Z8616 Personal history of COVID-19: Secondary | ICD-10-CM

## 2021-05-04 DIAGNOSIS — G4733 Obstructive sleep apnea (adult) (pediatric): Secondary | ICD-10-CM

## 2021-05-04 DIAGNOSIS — Z789 Other specified health status: Secondary | ICD-10-CM | POA: Diagnosis not present
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
## 2021-05-20 LAB — CUP PACEART REMOTE DEVICE CHECK
Date Time Interrogation Session: 20221204230719
Implantable Pulse Generator Implant Date: 20210512

## 2021-05-25 ENCOUNTER — Telehealth: Payer: Self-pay

## 2021-05-25 NOTE — Telephone Encounter (Signed)
Pt states she wants to stop being monitored. She states that Dr. Alvester Chou does not thinks she needs to be monitored. I let her speak with Portia, rn.

## 2021-05-25 NOTE — Telephone Encounter (Signed)
Patient states at her last appointment with Dr. Gwenlyn Found she was told she did not need to be monitored by her loop recorder as it has not shown any arhythmia or bradycardic episodes. She is billed 20.00/month and this is costly to her at this time of year. She request to discontinue remote monitoring. She is advised she may delete the app on her phone, all future remote appointment has been cancelled. She is marked inactive in [place] and discontinued in [place]. Will route to Dr. Sallyanne Kuster (implanting provider) and Dr. Gwenlyn Found as Juluis Rainier.

## 2021-06-22 ENCOUNTER — Ambulatory Visit (INDEPENDENT_AMBULATORY_CARE_PROVIDER_SITE_OTHER): Payer: Medicare Other | Admitting: Neurology

## 2021-06-22 DIAGNOSIS — Z789 Other specified health status: Secondary | ICD-10-CM

## 2021-06-22 DIAGNOSIS — Z8616 Personal history of COVID-19: Secondary | ICD-10-CM

## 2021-06-22 DIAGNOSIS — G4733 Obstructive sleep apnea (adult) (pediatric): Secondary | ICD-10-CM | POA: Diagnosis not present

## 2021-06-22 DIAGNOSIS — Z4549 Encounter for adjustment and management of other implanted nervous system device: Secondary | ICD-10-CM

## 2021-06-24 ENCOUNTER — Other Ambulatory Visit: Payer: Self-pay | Admitting: Internal Medicine

## 2021-06-24 DIAGNOSIS — M899 Disorder of bone, unspecified: Secondary | ICD-10-CM

## 2021-06-24 NOTE — Progress Notes (Signed)
[facility] TEST REPORT ( by Watch PAT)   STUDY DATA from:  06-24-2021      ORDERING CLINICIAN: [name], MD  REFERRING CLINICIAN: Dr Joylene Draft, Dr    CLINICAL INFORMATION/HISTORY: patient transferred apnea care from [facility] to [facility]- was on BiPAP when she transferred but was interested in [place].  Baseline AHI in 9/ 2019 was 51.9/h  Encounter for adjustment and management of other implanted nervous system device, Inspire patient. HST to confirm apnea reduction.  05-04-2021: Encounter for adjustment and management of other implanted nervous system device.  I am seeing Elizabeth Walker ,working on her settings for her inspire device.  So my visit today is more based on clinical questions how the patient currently feels and if she is doing well , how her sleep quality has developed.  Set at 2.0 V. She reports that most nights she sleeps well but there will be a sequence of 2 or 3 nights in a row once in a month with poor sleep, and trouble to go to sleep.    Epworth sleepiness score: 6 /24.    FINDINGS:   Sleep Summary:   Total Recording Time (hours, min):     8 h and 15 m   Total Sleep Time (hours, min):    7 h and 12 m             Percent REM (%):      20.1 %                                  Respiratory Indices:   Calculated pAHI (per hour):  13.3/h                           REM pAHI:       20.8/h                                          NREM pAHI:    11.4/h                        Supine AHI:   8.5/h - invalid data for non supine sleep positions.        Snoring: mean Vol 40 dB and only present for 4.6% of TST                                            Oxygen Saturation Statistics:   O2 Saturation Range (%):   86 through 99 %                                    O2 Saturation (minutes) <89%:  0.1 min         Pulse Rate Statistics:    Pulse Range:     41 through 86 bpm.             IMPRESSION:  This HST confirms the presence of  mild  sleep apnea , REM accentuated, but also persistent Non-REM sleep apnea under current INSPIRE  settings.  Chronic insomnia is not expected to be related to apnea and therefor should not improve with its treatment. Snoring is already well controlled.   RECOMMENDATION: increasing the Inspire settings to 2.2V or higher , a level where the non REM sleep apnea can be alleviated, which should reduce overall AHI to under 10/h.  Follow up for increase in Voltage,    INTERPRETING PHYSICIAN: [name], MD  Medical Director of [facility] at [facility].

## 2021-06-26 ENCOUNTER — Telehealth: Payer: Self-pay | Admitting: Cardiovascular Disease

## 2021-06-26 NOTE — Telephone Encounter (Signed)
New Message:    Patient says her insurance will no longer pay for the brand name Bystolic, they will pay for generic only.    *STAT* If patient is at the pharmacy, call can be transferred to refill team.   1. Which medications need to be refilled? (please list name of each medication and dose if known) new prescription for Nebivolol  2. Which pharmacy/location (including street and city if local pharmacy) is medication to be sent to?[facility] [place]. [place]  3. Do they need a 30 day or 90 day supply? 30 days and refills

## 2021-06-29 ENCOUNTER — Other Ambulatory Visit: Payer: Self-pay

## 2021-06-29 MED ORDER — NEBIVOLOL HCL 2.5 MG PO TABS: 2.5000 mg | ORAL_TABLET | Freq: Every day | ORAL | 1 refills | Status: DC

## 2021-06-29 NOTE — Telephone Encounter (Signed)
Called patient, LVM to call back to discuss if questions.  Advised I had sent in RX to pharmacy.

## 2021-06-29 NOTE — Telephone Encounter (Signed)
° °  Pt is calling to f/u on getting new prescription for nebivolol

## 2021-07-02 DIAGNOSIS — Z8616 Personal history of COVID-19: Secondary | ICD-10-CM | POA: Insufficient documentation

## 2021-07-02 NOTE — Procedures (Signed)
[facility] TEST REPORT ( by Watch PAT)   STUDY DATA from:  06-24-2021      ORDERING CLINICIAN: [name], MD  REFERRING CLINICIAN: Dr Joylene Draft, Dr    CLINICAL INFORMATION/HISTORY: patient transferred apnea care from [facility] to [facility]- was on BiPAP when she transferred but was interested in [place].  Baseline AHI in 9/ 2019 was 51.9/h  Encounter for adjustment and management of other implanted nervous system device, Inspire patient. HST to confirm apnea reduction.  05-04-2021: Encounter for adjustment and management of other implanted nervous system device.  I am seeing [name]. [place] ,working on her settings for her inspire device.  So my visit today is more based on clinical questions how the patient currently feels and if she is doing well , how her sleep quality has developed.  Set at 2.0 V. She reports that most nights she sleeps well but there will be a sequence of 2 or 3 nights in a row once in a month with poor sleep, and trouble to go to sleep.    Epworth sleepiness score: 6 /24.    FINDINGS:   Sleep Summary:   Total Recording Time (hours, min):     8 h and 15 m   Total Sleep Time (hours, min):    7 h and 12 m             Percent REM (%):      20.1 %                                  Respiratory Indices:   Calculated pAHI (per hour):  13.3/h                           REM pAHI:       20.8/h                                          NREM pAHI:    11.4/h                        Supine AHI:   8.5/h - invalid data for non supine sleep positions.        Snoring: mean Vol 40 dB and only present for 4.6% of TST                                            Oxygen Saturation Statistics:   O2 Saturation Range (%):   86 through 99 %                                    O2 Saturation (minutes) <89%:  0.1 min         Pulse Rate Statistics:    Pulse Range:     41 through 86 bpm.             IMPRESSION:  This HST confirms the presence of  mild sleep apnea ,  REM accentuated, but also persistent Non-REM sleep apnea under current INSPIRE settings.  Chronic insomnia is  not expected to be related to apnea and therefor should not improve with its treatment. Snoring is already well controlled.   RECOMMENDATION: increasing the Inspire settings to 2.2V or higher , a level where the non REM sleep apnea can be alleviated, which should reduce overall AHI to under 10/h.  Follow up for increase in Voltage,    INTERPRETING PHYSICIAN: [name], MD  Medical Director of [facility] at [facility].

## 2021-07-02 NOTE — Progress Notes (Signed)
Incomplete resolution of non REM sleep apnea at current settings -INSPIRE patient.,  Achieved significant reduction in AHI , but is still at over 10/h. Increase in Voltage needed.

## 2021-07-03 ENCOUNTER — Other Ambulatory Visit: Payer: Self-pay

## 2021-07-03 MED ORDER — NEBIVOLOL HCL 2.5 MG PO TABS: 2.5000 mg | ORAL_TABLET | Freq: Every day | ORAL | 3 refills | Status: DC

## 2021-07-06 ENCOUNTER — Telehealth: Payer: Self-pay | Admitting: Neurology

## 2021-07-06 NOTE — Telephone Encounter (Signed)
-----   Message from [name], MD sent at 07/02/2021  6:01 PM EST ----- Incomplete resolution of non REM sleep apnea at current settings -INSPIRE patient.,  Achieved significant reduction in AHI , but is still at over 10/h. Increase in Voltage needed.

## 2021-07-06 NOTE — Telephone Encounter (Signed)
Called the patient and reviewed the recent sleep study results. Even at the current setting of inspire she continues to have sleep apnea. Pt will come in on Thursday 07/09/21 at 9:30 am to have the inspire increased. Pt verbalized understanding.

## 2021-07-09 ENCOUNTER — Encounter: Payer: Self-pay | Admitting: Neurology

## 2021-07-09 ENCOUNTER — Ambulatory Visit: Payer: Medicare Other | Admitting: Neurology

## 2021-07-09 ENCOUNTER — Other Ambulatory Visit: Payer: Self-pay

## 2021-07-09 ENCOUNTER — Telehealth: Payer: Self-pay

## 2021-07-09 DIAGNOSIS — Z4549 Encounter for adjustment and management of other implanted nervous system device: Secondary | ICD-10-CM | POA: Diagnosis not present

## 2021-07-09 DIAGNOSIS — G4733 Obstructive sleep apnea (adult) (pediatric): Secondary | ICD-10-CM | POA: Diagnosis not present

## 2021-07-09 DIAGNOSIS — Z9682 Presence of neurostimulator: Secondary | ICD-10-CM

## 2021-07-09 NOTE — Telephone Encounter (Signed)
° °  Pt's remote download:       Waveform from new settings:

## 2021-07-09 NOTE — Progress Notes (Addendum)
SLEEP MEDICINE CLINIC   Provider:  [name], MD   Primary Care Physician:  Crist Infante, MD   Referring Provider: Crist Infante, MD   Chief Complaint  Patient presents with   Follow-up       Other: INPIRE FOLLOW UP <Screening for apnea control.        07-09-2021 Encounter for adjustment and management of other implanted nervous system device. After recent HST showed still an AHI of over 10/h. We wanted to look at complaince at home and if she can tolerate a slight increase in V. 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 Increase in Voltage needed.     05-04-2021: Encounter for adjustment and management of other implanted nervous system device.  I am seeing [name]. Spake today my last encounter about 4 months ago.  At that time we were working on her settings for her inspire device.  So my visit today is more based on clinical questions how the patient currently feels if she is doing well how her sleep quality has developed. She reports that most nights she sleeps well but there will be a sequence of 2 or 3 nights in a row once a month or so in which she lost poor sleep trouble to go to sleep trouble to stay asleep.  Epworth sleepiness score today is endorsed at 9 points which is not elevated there was no indication of fatigue, and she had no resetting to patient induced settings the inspire device.   Her medications have not changed she remains on Synthroid, as needed Ativan, Flonase they have approved, [place], Lipitor, Tylenol as needed and Prilosec as well as Bystolic.  The geriatric depression score was endorsed at 3 out of 15 points and is not indicative of clinical depression.   12-31-2020 Inspire resetting. Level 3.    New  Interval history- 07-22-2020,  At the pleasure of meeting today with [name]. [place], she underwent a retitration for inspire  polysomnography on 17 July 2020. The inspire device at home had a setting of 1.9 V and the started retitration that night at 1.7 V the voltage was finally increased to a final setting of 2.0 V with an AHI of 0.0.  Sadly sleep ended at midnight and the patient did not feel able to reinitiate sleep.  She had some GI upset the same night of the study-.  So the total sleep time that we have recorded was forward at only 115 minutes but it was clear that a goal voltage of 2.0 V she seem to have no longer had to apnea -although there may have been some snoring  No hypoxemia was seen no irregular EKGs.  The device had been set from the sleep study for minimum of 1.5 V to a maximum of 2.7 V in which the patient can now slowly advance.   Her goal would be to reach 2.0 V as of today.  The patient had at home use the device for 70% of the time at 1.9 voltage, the goal is now to advance her to 2.0.  Since she still struggles with primary insomnia the therapy delay is sometimes too short for her.  She can manually reset and restart on an hourly basis  The device will be reset to 2.0.V now. That is level 3 , her new therapeutic setting.  How likely are you to doze  in the following situations: 0 = not likely, 1 = slight chance, 2 = moderate chance, 3 = high chance  Sitting and Reading? Watching Television? Sitting inactive in a public place (theater or meeting)? Lying down in the afternoon when circumstances permit? Sitting and talking to someone? Sitting quietly after lunch without alcohol? In a car, while stopped for a few minutes in traffic? As a passenger in a car for an hour without a break?  Total = 6 from 17. Better focus.    Struggling with insomnia, . No longer falling asleep at 5 PM.       RV 05-05-2020. Mrs. Fatisha Rabalais. Kleeman is a 76 year old chronic insomnia patient who also was intolerant of CPAP mainly due to chronic and recurrent sinusitis.  Her initial presentation was for excessive  daytime sleepiness but it became soon clear that once apnea was treated and the underlying problem was still there which is chronic insomnia.  I learned today that she has been on a little RTC through the healthcare system I had referred her for cognitive behavioral therapy and she completed her treatment sessions there but to no avail.  She does not feel that she has gained any kind of additional sleep hours of sleep quality.  From there she was referred to Dr. Erick Blinks sleep services and he further investigated her insomnia and diagnosed her with a circadian rhythm disorder.  She has cyclic sleep problems sometimes cannot sleep for several nights in a row then kind of makes up for it by sleeping for 1 or 2 days straight.  This pattern is not considered physiologic.  It has persisted and besides the daylight exposure and melatonin at night there is very little that has worked for these patients, the sleep disorder is considered resistant to any medication.  It also bears a high risk of becoming dependent on a sleep aid which introduced cannot override the internal medicine.  Her apnea on [name] is considered well controlled, successful implantation, tolerates settings, knows the difference when she forgot to activate the device over night. She did not bring her device remote control and I can't interrogate the device.  She is now 13 month since implantation and we can order a HST forJanuary to see if there is a need for more voltage.  Patient agrees with January 2022.         08-20-2019:RV  This patient underwent INSPIRE titration to a final 2 volt power level, and has tolerated the device well. The night of the study ,07-23-2019, was also unusual as she took 1.5 mg of ativan to sleep- reportedly had been insomnic for 3 nights prior. Fatigue reportedly very high 63/ 63 points.  The resulting sleep study showed still hypoxemia. Still has some mornings a dry mouth. Husband noted light snoring.  The  current level as of today for this patient is 1.5-2.5 volt, meeting at her sweet spot. LEVEL 3.  The technologist interrogated the device- he start delay was set for an hour due to insomnia, duration of 9 hours.  New settings 1.8-2.2 Volt , Screen shot reveals good response to inhalation, rib cage expansion trigger. Remote activation.    HPt's remote download:         Waveform from new settings:         Physical exam:  General: The patient is awake, alert and appears not in acute distress. The patient is well groomed. Head: Normocephalic, atraumatic. Neck is supple. Mallampati 2- wide open- ,  neck  circumference:15. Nasal airflow patent - TMJ is not evident . Lower jaw with crowded dental status seen.  Cardiovascular:  Regular rate and rhythm , without  murmurs or carotid bruit, and without distended neck veins. Respiratory: Lungs are clear to auscultation. Skin:  Without evidence of edema, or rash Trunk: BMI is now 19.78. The patient's posture is erect.   Neurologic exam : The patient is awake and alert, oriented to place and time.   Speech is fluent,-no  dysarthria, dysphonia or aphasia.  Mood and affect are appropriate.  Cranial nerves: Pupils are equal and briskly reactive to light. Funduscopic exam without evidence of pallor or edema.  Extraocular movements  in vertical and horizontal planes intact and without nystagmus. Visual fields by finger perimetry are intact. Hearing : cochlear bar implant - left ear.  Facial sensation intact to fine touch.  Facial motor strength is symmetric and tongue and uvula move midline. Shoulder shrug was symmetrical.   Assessment/ Plan :  After physical and neurologic examination, review of laboratory studies,  Personal review of imaging studies, reports of other /same  Imaging studies, results of polysomnography and / or neurophysiology testing and pre-existing records as far as provided in visit., my assessment is ;  1) OSA , CPAP  intolerant patient : She is now 24 month since implantation and HST showed a reduction of 80% in OSA- she remains with chronic insomnia and has a residual AHI of over 10/h.  We increased the Voltage by 0.1 V only and encouraged her to increased further at home, pause time 30 minutes , sleep latency setting at 60 minutes.   2) insomnia. Unrelated to pain, mind is wondering- reading a book or listening to audi-books or meditation -music, but nothing helps well. She is participating  in [place]. Reading may be the most helpful -  She feels not depressed.  Narcolepsy HLA was negative 08-25-2017 .  Rv in 12 months with NP ,    [name], MD   7/51/0258, 52:77 AM  Certified in Neurology by ABPN Certified in [place] by [facility] [address] [place], [place] [address]

## 2021-07-11 ENCOUNTER — Other Ambulatory Visit: Payer: Self-pay

## 2021-07-11 ENCOUNTER — Ambulatory Visit (HOSPITAL_COMMUNITY)
Admission: EM | Admit: 2021-07-11 | Discharge: 2021-07-11 | Disposition: A | Discharge status: Home / Self Care | Payer: Medicare Other | Attending: Student | Admitting: Student

## 2021-07-11 ENCOUNTER — Encounter (HOSPITAL_COMMUNITY): Payer: Self-pay

## 2021-07-11 DIAGNOSIS — J4520 Mild intermittent asthma, uncomplicated: Secondary | ICD-10-CM | POA: Diagnosis not present

## 2021-07-11 DIAGNOSIS — J0110 Acute frontal sinusitis, unspecified: Secondary | ICD-10-CM

## 2021-07-11 MED ORDER — AMOXICILLIN-POT CLAVULANATE 875-125 MG PO TABS: 1.0000 | ORAL_TABLET | Freq: Two times a day (BID) | ORAL | 0 refills | Status: DC

## 2021-07-11 NOTE — Discharge Instructions (Addendum)
-  Start the antibiotic-Augmentin (amoxicillin-clavulanate), 1 pill every 12 hours for 7 days.  You can take this with food like with breakfast and dinner. -If necessary - symbicort daily and albuterol as needed -Follow-up if symptoms getting worse instead of better - facial pain, new chest pain, new shortness of breath, etc.

## 2021-07-11 NOTE — ED Provider Notes (Signed)
[place]    CSN: [id number] Arrival date & time: 07/11/21  1542      History   Chief Complaint Chief Complaint  Patient presents with   Sore Throat    COUGHING X 1 WEEK    HPI Elizabeth Walker is a 76 y.o. female presenting with concern for sinusitis following viral syndrome x10 days. Medical history GERD, sinusitis, osteoporosis, CAP, asthma. New onset onset of bilateral ear pressure and sinus pain behind forehead, without hearing changes, dizziness, tinnitus. Some PND. Temperature running 99.7 at home, states this is the first day she's had a fever. Cough is productive of green mucous, denies SOB, DOE, CP. States symptoms not consistent with past pneumonia. Has symbicort and albuterol inhalers at home, has not required during present illness. Two negative home covid tests.   HPI  Past Medical History:  Diagnosis Date   Anxiety    Arthritis    Depression    Esophageal spasm    a. pt reports prior GI study demonstrating this.   GERD (gastroesophageal reflux disease)    Hypercholesteremia    Hypertension    Hypothyroid    Migraine    Osteoporosis    Pneumonia    PONV (postoperative nausea and vomiting)    Sinusitis    Sleep apnea    a. intolerant to CPAP.   Tachycardia    a. suspected due to sinus tach. Event monitor/echo unremarkable.    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   Dizziness 10/03/2019   OSA (obstructive sleep apnea) 08/20/2019   Slow heart rate 05/24/2019   Obstructive sleep apnea 03/09/2019   Chronic insomnia 08/29/2017   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   Excessive daytime sleepiness 08/29/2017    Intolerance of continuous positive airway pressure (CPAP) ventilation 08/29/2017   Sinusitis, chronic 01/02/2015   Cough variant asthma 06/18/2014   Dyspnea on exertion 05/13/2014   Hypercholesteremia    Atypical chest pain 04/08/2014   Asthma with acute exacerbation 04/03/2014   PNA (pneumonia) 04/02/2014   Hypokalemia 04/02/2014   CAP (community acquired pneumonia) 04/02/2014   Hypothyroidism 04/10/2013   Tachycardia 04/22/2011   HYPERLIPIDEMIA 12/04/2007   Essential hypertension 12/04/2007   ALLERGIC RHINITIS 12/04/2007   ASTHMA 12/04/2007   HEADACHE, CHRONIC 12/04/2007   Cough 12/04/2007    Past Surgical History:  Procedure Laterality Date   AUGMENTATION MAMMAPLASTY Bilateral    BREAST ENHANCEMENT SURGERY     cataracts  2016   CERVICAL SPINE SURGERY     C5-7 ACDF 12/23/2015   CESAREAN SECTION  1967   COMBINED AUGMENTATION MAMMAPLASTY AND ABDOMINOPLASTY     DRUG INDUCED ENDOSCOPY N/A 07/21/2018   Procedure: DRUG INDUCED ENDOSCOPY;  Surgeon: Jerrell Belfast, MD;  Location: [facility];  Service: ENT;  Laterality: N/A;  Drug induced sleep endoscopy   HEMORROIDECTOMY  2009   IMPLANTATION OF HYPOGLOSSAL NERVE STIMULATOR  03/09/2019   [id number]   IMPLANTATION OF HYPOGLOSSAL NERVE STIMULATOR Right 03/09/2019   Procedure: IMPLANTATION OF HYPOGLOSSAL NERVE STIMULATOR;  Surgeon: Jerrell Belfast, MD;  Location: [facility];  Service: ENT;  Laterality: Right;    OB History   No obstetric history on file.  Home Medications    Prior to Admission medications   Medication Sig Start Date End Date Taking? Authorizing Provider  amoxicillin-clavulanate (AUGMENTIN) 875-125 MG tablet Take 1 tablet by mouth every 12 (twelve) hours. 07/11/21  Yes Hazel Sams, PA-C  acetaminophen (TYLENOL) 500 MG tablet Take 500 mg by mouth every 6 (six) hours as needed (for pain.).     [provider]  atorvastatin (LIPITOR) 40 MG tablet TAKE ONE TABLET BY MOUTH ONE TIME DAILY   Patient taking differently: Take 40 mg by mouth daily. 04/09/14   Elayne Snare, MD  chlorthalidone (HYGROTON) 25 MG tablet Take 25 mg by mouth daily. 05/21/20   [provider]  denosumab (PROLIA) 60 MG/ML SOSY injection Inject 60 mg into the skin every 6 (six) months.    [provider]  ergocalciferol (VITAMIN D2) 50000 UNITS capsule Take 50,000 Units by mouth every Saturday.    [provider]  fluticasone (FLONASE) 50 MCG/ACT nasal spray Place 1-2 sprays into both nostrils daily as needed for allergies or rhinitis.    [provider]  irbesartan (AVAPRO) 300 MG tablet Take 300 mg by mouth every morning.     [provider]  levothyroxine (SYNTHROID, LEVOTHROID) 100 MCG tablet TAKE 1 TABLET BY MOUTH ONCE DAILY Patient taking differently: Take 100 mcg by mouth daily before breakfast. 07/15/17   Elayne Snare, MD  LORazepam (ATIVAN) 0.5 MG tablet For insomnia. 07/22/20   Dohmeier, Asencion Partridge, MD  meclizine (ANTIVERT) 25 MG tablet Take 1 tablet (25 mg total) by mouth 3 (three) times daily as needed for dizziness. 06/03/18   Isla Pence, MD  nebivolol (BYSTOLIC) 2.5 MG tablet Take 1 tablet (2.5 mg total) by mouth daily. 07/03/21   Lorretta Harp, MD  omeprazole (PRILOSEC) 20 MG capsule Take 20 mg by mouth daily before breakfast.    [provider]  Probiotic Product (ACIDOPHILUS/GOAT MILK) CAPS Take by mouth.    [provider]    Family History Family History  Problem Relation Age of Onset   Coronary artery disease Other    CAD Father        died at 32 - massive heart attack   Emphysema Father        smoked   Asthma Father    Heart disease Father    Heart disease Sister        had open heart surgery age 22   Diabetes Sister    Asthma Brother    CAD Brother        had open heart surgery at 82   Kidney failure Brother    Stroke Sister    Sjogren's syndrome Mother     Social History Social History   Tobacco Use   Smoking  status: Never   Smokeless tobacco: Never  Vaping Use   Vaping Use: Never used  Substance Use Topics   Alcohol use: Yes    Comment: rare wine   Drug use: No     Allergies   Pollen extract and Codeine   Review of Systems Review of Systems  Constitutional:  Negative for appetite change, chills and fever.  HENT:  Positive for congestion, sinus pressure and sore throat. Negative for ear pain, rhinorrhea and sinus pain.   Eyes:  Negative for redness and visual disturbance.  Respiratory:  Positive for cough. Negative for chest tightness, shortness of breath and wheezing.   Cardiovascular:  Negative for chest pain and palpitations.  Gastrointestinal:  Negative for abdominal pain,  constipation, diarrhea, nausea and vomiting.  Genitourinary:  Negative for dysuria, frequency and urgency.  Musculoskeletal:  Negative for myalgias.  Neurological:  Negative for dizziness, weakness and headaches.  Psychiatric/Behavioral:  Negative for confusion.   All other systems reviewed and are negative.   Physical Exam Triage Vital Signs ED Triage Vitals  Enc Vitals Group     BP      Pulse      Resp      Temp      Temp src      SpO2      Weight      Height      Head Circumference      Peak Flow      Pain Score      Pain Loc      Pain Edu?      Excl. in [place]?    No data found.  Updated Vital Signs BP 136/69 (BP Location: Left Arm)    Pulse 72    Temp 100.2 F (37.9 C) (Oral)    Resp 16    SpO2 100%   Visual Acuity Right Eye Distance:   Left Eye Distance:   Bilateral Distance:    Right Eye Near:   Left Eye Near:    Bilateral Near:     Physical Exam Vitals reviewed.  Constitutional:      General: She is not in acute distress.    Appearance: Normal appearance. She is not ill-appearing.  HENT:     Head: Normocephalic and atraumatic.     Right Ear: Tympanic membrane, ear canal and external ear normal. No tenderness. No middle ear effusion. There is no impacted cerumen. Tympanic  membrane is not perforated, erythematous, retracted or bulging.     Left Ear: Tympanic membrane, ear canal and external ear normal. No tenderness.  No middle ear effusion. There is no impacted cerumen. Tympanic membrane is not perforated, erythematous, retracted or bulging.     Nose: No congestion.     Right Sinus: Frontal sinus tenderness present. No maxillary sinus tenderness.     Left Sinus: Frontal sinus tenderness present. No maxillary sinus tenderness.     Mouth/Throat:     Mouth: Mucous membranes are moist.     Pharynx: Uvula midline. No oropharyngeal exudate or posterior oropharyngeal erythema.  Eyes:     Extraocular Movements: Extraocular movements intact.     Pupils: Pupils are equal, round, and reactive to light.  Cardiovascular:     Rate and Rhythm: Normal rate and regular rhythm.     Heart sounds: Normal heart sounds.  Pulmonary:     Effort: Pulmonary effort is normal.     Breath sounds: Normal breath sounds. No decreased breath sounds, wheezing, rhonchi or rales.  Abdominal:     Palpations: Abdomen is soft.     Tenderness: There is no abdominal tenderness. There is no guarding or rebound.  Lymphadenopathy:     Cervical: No cervical adenopathy.     Right cervical: No superficial cervical adenopathy.    Left cervical: No superficial cervical adenopathy.  Neurological:     General: No focal deficit present.     Mental Status: She is alert and oriented to person, place, and time.  Psychiatric:        Mood and Affect: Mood normal.        Behavior: Behavior normal.        Thought Content: Thought content normal.        Judgment: Judgment normal.  UC Treatments / Results  Labs (all labs ordered are listed, but only abnormal results are displayed) Labs Reviewed - No data to display  EKG   Radiology No results found.  Procedures Procedures (including critical care time)  Medications Ordered in UC Medications - No data to display  Initial Impression /  Assessment and Plan / UC Course  I have reviewed the triage vital signs and the nursing notes.  Pertinent labs & imaging results that were available during my care of the patient were reviewed by me and considered in my medical decision making (see chart for details).     This patient is a very pleasant 76 y.o. year old female presenting with frontal sinusitis following viral syndrome x10 days.  She is borderline febrile at 100.2, states this is the first day she has had a fever.  Nontachycardic.  History of asthma, this is currently well controlled on no inhalers, though she has Symbicort and albuterol at home if needed.  Lung sounds are clear throughout, low suspicion of pneumonia, pneumothorax, etc. Negative home covid test.  We will treat with Augmentin for sinusitis. ED return precautions discussed. Patient verbalizes understanding and agreement.   Final Clinical Impressions(s) / UC Diagnoses   Final diagnoses:  Acute non-recurrent frontal sinusitis  Mild intermittent asthma without complication     Discharge Instructions      -Start the antibiotic-Augmentin (amoxicillin-clavulanate), 1 pill every 12 hours for 7 days.  You can take this with food like with breakfast and dinner. -If necessary - symbicort daily and albuterol as needed -Follow-up if symptoms getting worse instead of better - facial pain, new chest pain, new shortness of breath, etc.      ED Prescriptions     Medication Sig Dispense Auth. Provider   amoxicillin-clavulanate (AUGMENTIN) 875-125 MG tablet Take 1 tablet by mouth every 12 (twelve) hours. 14 tablet Hazel Sams, PA-C      PDMP not reviewed this encounter.   Hazel Sams, PA-C 07/11/21 1649

## 2021-07-11 NOTE — ED Triage Notes (Signed)
Pt presents for coughing,congestion and sore throat x1 week.

## 2021-07-13 ENCOUNTER — Other Ambulatory Visit: Payer: Self-pay

## 2021-07-13 ENCOUNTER — Ambulatory Visit
Admission: RE | Admit: 2021-07-13 | Discharge: 2021-07-13 | Disposition: A | Discharge status: Home / Self Care | Payer: Medicare Other | Source: Ambulatory Visit | Attending: Internal Medicine | Admitting: Internal Medicine

## 2021-07-13 DIAGNOSIS — M899 Disorder of bone, unspecified: Secondary | ICD-10-CM

## 2021-09-24 ENCOUNTER — Encounter: Payer: Self-pay | Admitting: Cardiovascular Disease

## 2021-09-24 ENCOUNTER — Telehealth: Payer: Self-pay | Admitting: Cardiovascular Disease

## 2021-09-24 NOTE — Telephone Encounter (Signed)
Pt called in and states that her HR was low at her MD appt's today, running high 40's and low 50's. And they wanted her to "let us know". Made appt with Dr Gwenlyn Found 4-26 to discuss this with him. Informed pt that her HR has been running low at her last few appts in [facility]. Verbalized that this was true but she was told to call. Informed pt to arrive early for this appt. ?

## 2021-09-24 NOTE — Telephone Encounter (Signed)
Left message for pt to call back  °

## 2021-09-24 NOTE — Telephone Encounter (Signed)
STAT if HR is under 50 or over 120 ?(normal HR is 60-100 beats per minute) ? ?What is your heart rate? 50 ? ?Do you have a log of your heart rate readings (document readings)? No ? ?Do you have any other symptoms? No ? ?

## 2021-10-07 ENCOUNTER — Ambulatory Visit: Payer: Medicare Other | Admitting: Cardiovascular Disease

## 2021-11-06 ENCOUNTER — Ambulatory Visit: Payer: Medicare Other | Admitting: Cardiovascular Disease

## 2021-12-16 ENCOUNTER — Encounter (HOSPITAL_COMMUNITY): Payer: Self-pay | Admitting: Emergency Medicine

## 2021-12-16 ENCOUNTER — Ambulatory Visit (HOSPITAL_COMMUNITY)
Admission: EM | Admit: 2021-12-16 | Discharge: 2021-12-16 | Disposition: A | Discharge status: Home / Self Care | Payer: Medicare Other | Attending: Family Medicine | Admitting: Family Medicine

## 2021-12-16 ENCOUNTER — Other Ambulatory Visit: Payer: Self-pay

## 2021-12-16 ENCOUNTER — Ambulatory Visit (INDEPENDENT_AMBULATORY_CARE_PROVIDER_SITE_OTHER): Payer: Medicare Other

## 2021-12-16 DIAGNOSIS — U071 COVID-19: Secondary | ICD-10-CM | POA: Diagnosis not present

## 2021-12-16 DIAGNOSIS — R059 Cough, unspecified: Secondary | ICD-10-CM

## 2021-12-16 DIAGNOSIS — R051 Acute cough: Secondary | ICD-10-CM | POA: Diagnosis not present

## 2021-12-16 DIAGNOSIS — J069 Acute upper respiratory infection, unspecified: Secondary | ICD-10-CM | POA: Diagnosis not present

## 2021-12-16 DIAGNOSIS — R509 Fever, unspecified: Secondary | ICD-10-CM | POA: Diagnosis not present

## 2021-12-16 LAB — POC INFLUENZA A AND B ANTIGEN (URGENT CARE ONLY)
INFLUENZA A ANTIGEN, POC: NEGATIVE
INFLUENZA B ANTIGEN, POC: NEGATIVE

## 2021-12-16 MED ORDER — ONDANSETRON 4 MG PO TBDP: 4.0000 mg | ORAL_TABLET | Freq: Three times a day (TID) | ORAL | 0 refills | Status: DC | PRN

## 2021-12-16 MED ORDER — ACETAMINOPHEN 325 MG PO TABS
ORAL_TABLET | ORAL | Status: AC
  Filled 2021-12-16: qty 2

## 2021-12-16 MED ORDER — CHERATUSSIN AC 100-10 MG/5ML PO SOLN: 5.0000 mL | Freq: Four times a day (QID) | ORAL | 0 refills | Status: DC | PRN

## 2021-12-16 MED ORDER — ACETAMINOPHEN 325 MG PO TABS
650.0000 mg | ORAL_TABLET | Freq: Once | ORAL | Status: AC
  Administered 2021-12-16: 650 mg via ORAL

## 2021-12-16 NOTE — ED Triage Notes (Signed)
Patient started with symptoms 2 days ago.  Symptoms worsened yesterday.  Patient flew from [place]. Patient chills, cough, fever, general aches, non-productive cough

## 2021-12-16 NOTE — Discharge Instructions (Addendum)
Your flu test was negative  Your chest x-ray was clear, and did not show pneumonia or fluid.   You have been swabbed for COVID, and the test will result in the next 24 hours. Our staff will call you if positive. If the test is positive, you should quarantine for 5 days.  Ondansetron dissolved in the mouth every 8 hours as needed for nausea or vomiting.  Potassium with codeine--take 1 teaspoon or 5 mL every 6 hours as needed for cough.  This is better tolerated with something on your stomach.

## 2021-12-16 NOTE — ED Provider Notes (Signed)
[place]    CSN: [id number] Arrival date & time: 12/16/21  1309      History   Chief Complaint Chief Complaint  Patient presents with   Cough    HPI Elizabeth Walker is a 76 y.o. female.    Cough  Here for history of fever since yesterday July 4.  She also has had headache, clear rhinorrhea, and cough.  She is aching everywhere.  She has had some nausea, but no vomiting or diarrhea.  She did have a little sore throat begin on July 2 or 3.  She did fly on July 1 and on July 3  Past medical history includes hypothyroidism, hypertension, and sleep apnea  Past Medical History:  Diagnosis Date   Anxiety    Arthritis    Depression    Esophageal spasm    a. pt reports prior GI study demonstrating this.   GERD (gastroesophageal reflux disease)    Hypercholesteremia    Hypertension    Hypothyroid    Migraine    Osteoporosis    Pneumonia    PONV (postoperative nausea and vomiting)    Sinusitis    Sleep apnea    a. intolerant to CPAP.   Tachycardia    a. suspected due to sinus tach. Event monitor/echo unremarkable.    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   Dizziness 10/03/2019   OSA (obstructive sleep apnea) 08/20/2019   Slow heart rate 05/24/2019   Obstructive sleep apnea 03/09/2019   Chronic insomnia 08/29/2017   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   Excessive daytime sleepiness 08/29/2017   Intolerance of continuous positive airway pressure (CPAP) ventilation 08/29/2017   Sinusitis, chronic 01/02/2015   Cough variant asthma 06/18/2014   Dyspnea on exertion 05/13/2014   Hypercholesteremia    Atypical chest  pain 04/08/2014   Asthma with acute exacerbation 04/03/2014   PNA (pneumonia) 04/02/2014   Hypokalemia 04/02/2014   CAP (community acquired pneumonia) 04/02/2014   Hypothyroidism 04/10/2013   Tachycardia 04/22/2011   HYPERLIPIDEMIA 12/04/2007   Essential hypertension 12/04/2007   ALLERGIC RHINITIS 12/04/2007   ASTHMA 12/04/2007   HEADACHE, CHRONIC 12/04/2007   Cough 12/04/2007    Past Surgical History:  Procedure Laterality Date   AUGMENTATION MAMMAPLASTY Bilateral    BREAST ENHANCEMENT SURGERY     cataracts  2016   CERVICAL SPINE SURGERY     C5-7 ACDF 12/23/2015   CESAREAN SECTION  1967   COMBINED AUGMENTATION MAMMAPLASTY AND ABDOMINOPLASTY     DRUG INDUCED ENDOSCOPY N/A 07/21/2018   Procedure: DRUG INDUCED ENDOSCOPY;  Surgeon: Jerrell Belfast, MD;  Location: [place];  Service: ENT;  Laterality: N/A;  Drug induced sleep endoscopy   HEMORROIDECTOMY  2009   IMPLANTATION OF HYPOGLOSSAL NERVE STIMULATOR  03/09/2019   [id number]   IMPLANTATION OF HYPOGLOSSAL NERVE STIMULATOR Right 03/09/2019   Procedure: IMPLANTATION OF HYPOGLOSSAL NERVE STIMULATOR;  Surgeon: Jerrell Belfast, MD;  Location: [facility] OR;  Service: ENT;  Laterality: Right;    OB History   No obstetric history on file.      Home Medications    Prior to Admission medications   Medication Sig Start Date End Date Taking? Authorizing Provider  guaiFENesin-codeine (CHERATUSSIN AC) 100-10 MG/5ML syrup Take 5 mLs by mouth 4 (four) times daily as needed for cough. 12/16/21  Yes Barrett Henle, MD  ondansetron (ZOFRAN-ODT) 4 MG disintegrating tablet Take 1 tablet (4 mg total) by mouth every 8 (eight) hours as needed for nausea or vomiting. 12/16/21  Yes Barrett Henle, MD  acetaminophen (TYLENOL) 500 MG tablet Take 500 mg by mouth every 6 (six) hours as needed (for pain.).     [provider]  atorvastatin (LIPITOR) 40 MG tablet TAKE ONE TABLET BY MOUTH ONE TIME DAILY  Patient taking  differently: Take 40 mg by mouth daily. 04/09/14   Elayne Snare, MD  chlorthalidone (HYGROTON) 25 MG tablet Take 25 mg by mouth daily. 05/21/20   [provider]  denosumab (PROLIA) 60 MG/ML SOSY injection Inject 60 mg into the skin every 6 (six) months.    [provider]  ergocalciferol (VITAMIN D2) 50000 UNITS capsule Take 50,000 Units by mouth every Saturday.    [provider]  fluticasone (FLONASE) 50 MCG/ACT nasal spray Place 1-2 sprays into both nostrils daily as needed for allergies or rhinitis.    [provider]  irbesartan (AVAPRO) 300 MG tablet Take 300 mg by mouth every morning.     [provider]  levothyroxine (SYNTHROID, LEVOTHROID) 100 MCG tablet TAKE 1 TABLET BY MOUTH ONCE DAILY Patient taking differently: Take 100 mcg by mouth daily before breakfast. 07/15/17   Elayne Snare, MD  LORazepam (ATIVAN) 0.5 MG tablet For insomnia. 07/22/20   Dohmeier, Asencion Partridge, MD  meclizine (ANTIVERT) 25 MG tablet Take 1 tablet (25 mg total) by mouth 3 (three) times daily as needed for dizziness. 06/03/18   Isla Pence, MD  nebivolol (BYSTOLIC) 2.5 MG tablet Take 1 tablet (2.5 mg total) by mouth daily. 07/03/21   Lorretta Harp, MD  omeprazole (PRILOSEC) 20 MG capsule Take 20 mg by mouth daily before breakfast.    [provider]  Probiotic Product (ACIDOPHILUS/GOAT MILK) CAPS Take by mouth.    [provider]    Family History Family History  Problem Relation Age of Onset   Coronary artery disease Other    CAD Father        died at 26 - massive heart attack   Emphysema Father        smoked   Asthma Father    Heart disease Father    Heart disease Sister        had open heart surgery age 67   Diabetes Sister    Asthma Brother    CAD Brother        had open heart surgery at 64   Kidney failure Brother    Stroke Sister    Sjogren's syndrome Mother     Social History Social History   Tobacco Use   Smoking status: Never    Smokeless tobacco: Never  Vaping Use   Vaping Use: Never used  Substance Use Topics   Alcohol use: Yes    Comment: rare wine   Drug use: No     Allergies   Pollen extract and Codeine   Review of Systems Review of Systems  Respiratory:  Positive for cough.      Physical Exam Triage Vital Signs ED Triage Vitals  Enc Vitals Group     BP 12/16/21 1345 132/68     Pulse Rate 12/16/21 1345 70     Resp 12/16/21 1345 20     Temp 12/16/21 1345 (!)  102.3 F (39.1 C)     Temp Source 12/16/21 1345 Oral     SpO2 12/16/21 1345 96 %     Weight --      Height --      Head Circumference --      Peak Flow --      Pain Score 12/16/21 1341 7     Pain Loc --      Pain Edu? --      Excl. in [place]? --    No data found.  Updated Vital Signs BP 132/68 (BP Location: Left Arm)   Pulse 70   Temp (!) 102.3 F (39.1 C) (Oral) Comment: last tylenol was 8 am today  Resp 20   SpO2 96%   Visual Acuity Right Eye Distance:   Left Eye Distance:   Bilateral Distance:    Right Eye Near:   Left Eye Near:    Bilateral Near:     Physical Exam Vitals reviewed.  Constitutional:      General: She is not in acute distress.    Appearance: She is not toxic-appearing.  HENT:     Right Ear: Tympanic membrane and ear canal normal.     Left Ear: Tympanic membrane and ear canal normal.     Nose: Nose normal.     Mouth/Throat:     Mouth: Mucous membranes are moist.     Comments: There is no erythema of the oropharynx.  There is some clear mucus draining in the oropharynx Eyes:     Extraocular Movements: Extraocular movements intact.     Conjunctiva/sclera: Conjunctivae normal.     Pupils: Pupils are equal, round, and reactive to light.  Cardiovascular:     Rate and Rhythm: Normal rate and regular rhythm.     Heart sounds: No murmur heard. Pulmonary:     Effort: Pulmonary effort is normal. No respiratory distress.     Breath sounds: No stridor. No wheezing, rhonchi or rales.  Musculoskeletal:      Cervical back: Neck supple.  Lymphadenopathy:     Cervical: No cervical adenopathy.  Skin:    Capillary Refill: Capillary refill takes less than 2 seconds.     Coloration: Skin is not jaundiced or pale.  Neurological:     General: No focal deficit present.     Mental Status: She is alert and oriented to person, place, and time.  Psychiatric:        Behavior: Behavior normal.      UC Treatments / Results  Labs (all labs ordered are listed, but only abnormal results are displayed) Labs Reviewed  SARS CORONAVIRUS 2 (TAT 6-24 HRS)  POC INFLUENZA A AND B ANTIGEN (URGENT CARE ONLY)    EKG   Radiology DG Chest 2 View  Result Date: 12/16/2021 CLINICAL DATA:  cough, fever x 2 days EXAM: CHEST - 2 VIEW COMPARISON:  Radiograph 08/17/2020 FINDINGS: Unchanged cardiomediastinal silhouette. There is a cardiac loop recorder overlying the lower chest and a stimulator generator overlying the right chest, unchanged. There is no focal airspace disease. There is no pleural effusion. No pneumothorax. There is no acute osseous abnormality. Bilateral shoulder degenerative changes. Mild thoracic spondylosis. IMPRESSION: No evidence of acute cardiopulmonary disease. Electronically Signed   By: Maurine Simmering M.D.   On: 12/16/2021 14:41    Procedures Procedures (including critical care time)  Medications Ordered in UC Medications  acetaminophen (TYLENOL) tablet 650 mg (650 mg Oral Given 12/16/21 1355)    Initial Impression / Assessment  and Plan / UC Course  I have reviewed the triage vital signs and the nursing notes.  Pertinent labs & imaging results that were available during my care of the patient were reviewed by me and considered in my medical decision making (see chart for details).     Flu test is negative.  Chest x-ray is clear without any infiltrate or fluid.  We will swab for COVID, and she is at risk for severe disease if positive.  Her GFR was reduced slightly when she last had labs in  [place] in 2022.  If positive for COVID, she should have a prescription for molnupiravir  I did discuss with the patient that I was sending in Zofran for nausea and Robitussin with codeine for her cough.  She was agreeable to that; charts that she is intolerant of codeine however Final Clinical Impressions(s) / UC Diagnoses   Final diagnoses:  Viral URI with cough     Discharge Instructions      Your flu test was negative  Your chest x-ray was clear, and did not show pneumonia or fluid.   You have been swabbed for COVID, and the test will result in the next 24 hours. Our staff will call you if positive. If the test is positive, you should quarantine for 5 days.  Ondansetron dissolved in the mouth every 8 hours as needed for nausea or vomiting.  Potassium with codeine--take 1 teaspoon or 5 mL every 6 hours as needed for cough.  This is better tolerated with something on your stomach.       ED Prescriptions     Medication Sig Dispense Auth. Provider   ondansetron (ZOFRAN-ODT) 4 MG disintegrating tablet Take 1 tablet (4 mg total) by mouth every 8 (eight) hours as needed for nausea or vomiting. 10 tablet Roderick Sweezy, Gwenlyn Perking, MD   guaiFENesin-codeine (CHERATUSSIN AC) 100-10 MG/5ML syrup Take 5 mLs by mouth 4 (four) times daily as needed for cough. 120 mL Barrett Henle, MD      I have reviewed the PDMP during this encounter.   Barrett Henle, MD 12/16/21 [phone]

## 2021-12-17 ENCOUNTER — Telehealth (HOSPITAL_COMMUNITY): Payer: Self-pay | Admitting: Emergency Medicine

## 2021-12-17 LAB — SARS CORONAVIRUS 2 (TAT 6-24 HRS): SARS Coronavirus 2: POSITIVE — AB

## 2021-12-17 MED ORDER — MOLNUPIRAVIR EUA 200MG CAPSULE: 4.0000 | ORAL_CAPSULE | Freq: Two times a day (BID) | ORAL | 0 refills | Status: AC

## 2022-01-19 ENCOUNTER — Telehealth: Payer: Self-pay | Admitting: Cardiovascular Disease

## 2022-01-19 NOTE — Telephone Encounter (Signed)
*  STAT* If patient is at the pharmacy, call can be transferred to refill team.   1. Which medications need to be refilled? (please list name of each medication and dose if known)  Need the generic Bystolic- insurance will not pay for the brand name  2. Which pharmacy/location (including street and city if local pharmacy) is medication to be sent to?[facility], [place]  3. Do they need a 30 day or 90 day supply? 30 days and refills- need this today please- completely out of it

## 2022-01-21 NOTE — Telephone Encounter (Signed)
Pt c/o medication issue:  1. Name of Medication: nebivolol (BYSTOLIC) 2.5 MG tablet  2. How are you currently taking this medication (dosage and times per day)? Taking the generic currently  3. Are you having a reaction (difficulty breathing--STAT)? No  4. What is your medication issue? Pt is calling in regards to this med being called in instead of the generic like she originally requested. She states that the insurance will not pay for this brand and would like this taken care of due to only have 1 tab left. Please advice.

## 2022-01-21 NOTE — Telephone Encounter (Signed)
LMTCB

## 2022-01-22 MED ORDER — NEBIVOLOL HCL 2.5 MG PO TABS: 2.5000 mg | ORAL_TABLET | Freq: Every day | ORAL | 0 refills | Status: DC

## 2022-01-22 NOTE — Telephone Encounter (Signed)
Spoke with patient regarding getting nebivolol 2.5 mg generic brand. Spoke with her pharmacy. Order needs to say generic substitute permitted. Called patient back and left message. Also need to reschedule appointment with her. She cancelled 10/07/21 appointment with Dr. Gwenlyn Found. Refill for nebivolol 2.5 mg daily placed.

## 2022-01-22 NOTE — Addendum Note (Signed)
Addended by: Betha Loa F on: 01/22/2022 10:22 AM   Modules accepted: Orders

## 2022-01-22 NOTE — Addendum Note (Signed)
Addended by: Betha Loa F on: 01/22/2022 10:25 AM   Modules accepted: Orders

## 2022-01-22 NOTE — Addendum Note (Signed)
Addended by: Betha Loa F on: 01/22/2022 10:31 AM   Modules accepted: Orders

## 2022-01-25 ENCOUNTER — Encounter: Payer: Self-pay | Admitting: Neurology

## 2022-01-25 ENCOUNTER — Telehealth (INDEPENDENT_AMBULATORY_CARE_PROVIDER_SITE_OTHER): Payer: Medicare Other | Admitting: Neurology

## 2022-01-25 ENCOUNTER — Telehealth: Payer: Self-pay | Admitting: Neurology

## 2022-01-25 DIAGNOSIS — F5104 Psychophysiologic insomnia: Secondary | ICD-10-CM

## 2022-01-25 DIAGNOSIS — G441 Vascular headache, not elsewhere classified: Secondary | ICD-10-CM | POA: Diagnosis not present

## 2022-01-25 DIAGNOSIS — Z9682 Presence of neurostimulator: Secondary | ICD-10-CM

## 2022-01-25 MED ORDER — LORAZEPAM 0.5 MG PO TABS: ORAL_TABLET | ORAL | 1 refills | Status: AC

## 2022-01-25 NOTE — Progress Notes (Signed)
Virtual Visit via Video Note  I connected with Elizabeth Walker on 01/25/22 at  3:30 PM EDT by a video enabled telemedicine application and verified that I am speaking with the correct person using two identifiers.  Location: Patient: at home  Provider: at [name]   I discussed the limitations of evaluation and management by telemedicine and the availability of in person appointments. The patient expressed understanding and agreed to proceed.  History of Present Illness: chronic Insomnia , on Inspire   01-25-2022  "I am at my wits end- insomnia is very bad. I am so tired- I may go through  3 days with only 3 hours of sleep between them. I got lightheaded, vomited this afternoon. No headaches. "  Ambien was prescribed by Dr. Joylene Draft.  No effect. Same for Belsomra , trazodone.  Right now lorazepam- 0.5 mg with mixed results. 2 a day prn. "CBT failed" - patient reportedly was seen by therapist , who reportedly told her she is fine? She reports racing mind- and that is not a response I expect form a counsilor. .  I cant find a note from a therapist informing us of her progress or work up plan.   I advised her to call [name] in the sleep lab for [facility] follow up.  [name] or another NP can do that visit.       07-09-2021 Encounter for adjustment and management of other implanted nervous system device. After recent HST showed still an AHI of over 10/h. We wanted to look at complaince at home and if she can tolerate a slight increase in V. 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 Increase in Voltage needed.   05-04-2021: Encounter for adjustment and management of other implanted nervous system device.  I am seeing Elizabeth Walker. Ines today my last encounter about 4 months ago.  At that time we were working on her settings for her inspire device.  So my visit today is more based on  clinical questions how the patient currently feels if she is doing well how her sleep quality has developed. She reports that most nights she sleeps well but there will be a sequence of 2 or 3 nights in a row once a month or so in which she lost poor sleep trouble to go to sleep trouble to stay asleep.  Epworth sleepiness score today is endorsed at 9 points which is not elevated there was no indication of fatigue, and she had no resetting to patient induced settings the inspire device.   Her medications have not changed she remains on Synthroid, as needed Ativan, Flonase they have approved, [place], Lipitor, Tylenol as needed and Prilosec as well as Bystolic.  The geriatric depression score was endorsed at 3 out of 15 points and is not indicative of clinical depression.   12-31-2020 Inspire resetting. Level 3.    New  Interval history- 07-22-2020,  At the pleasure of meeting today with Elizabeth Walker. Lober, she underwent a retitration for inspire polysomnography on 17 July 2020. The inspire device at home had a setting of 1.9 V and the started retitration that night at 1.7 V the voltage was finally increased to a final setting of 2.0 V with an AHI of 0.0.  Sadly sleep ended at midnight and the patient did not feel able to reinitiate sleep.  She had some GI upset the same  night of the study-.  So the total sleep time that we have recorded was forward at only 115 minutes but it was clear that a goal voltage of 2.0 V she seem to have no longer had to apnea -although there may have been some snoring  No hypoxemia was seen no irregular EKGs.  The device had been set from the sleep study for minimum of 1.5 V to a maximum of 2.7 V in which the patient can now slowly advance.   Her goal would be to reach 2.0 V as of today.  The patient had at home use the device for 70% of the time at 1.9 voltage, the goal is now to advance her to 2.0.  Since she still struggles with primary insomnia the therapy delay is sometimes  too short for her.  She can manually reset and restart on an hourly basis  The device will be reset to 2.0.V now. That is level 3 , her new therapeutic setting.  How likely are you to doze in the following situations: 0 = not likely, 1 = slight chance, 2 = moderate chance, 3 = high chance  Sitting and Reading? Watching Television? Sitting inactive in a public place (theater or meeting)? Lying down in the afternoon when circumstances permit? Sitting and talking to someone? Sitting quietly after lunch without alcohol? In a car, while stopped for a few minutes in traffic? As a passenger in a car for an hour without a break?  Total = 6 from 17. Better focus.    Struggling with insomnia, . No longer falling asleep at 5 PM.         Observations/Objective:  " INSOMNIA " patient reports severe fatigue , yet endorsed no elevated level of fatigue in the FSS, Epworth score is 7/ 24 points.   OSA on Inspire : 2.1 V   Assessment and Plan: Chronic insomnia, racing mind. Inability to fall asleep , inability to stay asleep-  I cant find a note from a therapist informing us of her progress or work up plan.  Ativan used as last resort 0.5 mg at bedtime prn.   I advised her to call [name] in the sleep lab for [facility] follow up.  [name] or another NP can do that visit.    Follow Up Instructions:   I discussed the assessment and treatment plan with the patient. The patient was provided an opportunity to ask questions and all were answered. The patient agreed with the plan and demonstrated an understanding of the instructions.   The patient was advised to call back or seek an in-person evaluation if the symptoms worsen or if the condition fails to improve as anticipated.  I provided 23 minutes of non-face-to-face time during this encounter.   [name], MD

## 2022-01-25 NOTE — Patient Instructions (Signed)
Please read the following online guidance for insomnia.   NIH: Your Guide to better Sleep.   Insomnia Insomnia is a sleep disorder that makes it difficult to fall asleep or stay asleep. Insomnia can cause fatigue, low energy, difficulty concentrating, mood swings, and poor performance at work or school. There are three different ways to classify insomnia: Difficulty falling asleep. Difficulty staying asleep. Waking up too early in the morning. Any type of insomnia can be long-term (chronic) or short-term (acute). Both are common. Short-term insomnia usually lasts for 3 months or less. Chronic insomnia occurs at least three times a week for longer than 3 months. What are the causes? Insomnia may be caused by another condition, situation, or substance, such as: Having certain mental health conditions, such as anxiety and depression. Using caffeine, alcohol, tobacco, or drugs. Having gastrointestinal conditions, such as gastroesophageal reflux disease (GERD). Having certain medical conditions. These include: Asthma. Alzheimer's disease. Stroke. Chronic pain. An overactive thyroid gland (hyperthyroidism). Other sleep disorders, such as restless legs syndrome and sleep apnea. Menopause. Sometimes, the cause of insomnia may not be known. What increases the risk? Risk factors for insomnia include: Gender. Females are affected more often than males. Age. Insomnia is more common as people get older. Stress and certain medical and mental health conditions. Lack of exercise. Having an irregular work schedule. This may include working night shifts and traveling between different time zones. What are the signs or symptoms? If you have insomnia, the main symptom is having trouble falling asleep or having trouble staying asleep. This may lead to other symptoms, such as: Feeling tired or having low energy. Feeling nervous about going to sleep. Not feeling rested in the morning. Having trouble  concentrating. Feeling irritable, anxious, or depressed. How is this diagnosed? This condition may be diagnosed based on: Your symptoms and medical history. Your health care provider may ask about: Your sleep habits. Any medical conditions you have. Your mental health. A physical exam. How is this treated? Treatment for insomnia depends on the cause. Treatment may focus on treating an underlying condition that is causing the insomnia. Treatment may also include: Medicines to help you sleep. Counseling or therapy. Lifestyle adjustments to help you sleep better. Follow these instructions at home: Eating and drinking  Limit or avoid alcohol, caffeinated beverages, and products that contain nicotine and tobacco, especially close to bedtime. These can disrupt your sleep. Do not eat a large meal or eat spicy foods right before bedtime. This can lead to digestive discomfort that can make it hard for you to sleep. Sleep habits  Keep a sleep diary to help you and your health care provider figure out what could be causing your insomnia. Write down: When you sleep. When you wake up during the night. How well you sleep and how rested you feel the next day. Any side effects of medicines you are taking. What you eat and drink. Make your bedroom a dark, comfortable place where it is easy to fall asleep. Put up shades or blackout curtains to block light from outside. Use a white noise machine to block noise. Keep the temperature cool. Limit screen use before bedtime. This includes: Not watching TV. Not using your smartphone, tablet, or computer. Stick to a routine that includes going to bed and waking up at the same times every day and night. This can help you fall asleep faster. Consider making a quiet activity, such as reading, part of your nighttime routine. Try to avoid taking naps during the  day so that you sleep better at night. Get out of bed if you are still awake after 15 minutes of  trying to sleep. Keep the lights down, but try reading or doing a quiet activity. When you feel sleepy, go back to bed. General instructions Take over-the-counter and prescription medicines only as told by your health care provider. Exercise regularly as told by your health care provider. However, avoid exercising in the hours right before bedtime. Use relaxation techniques to manage stress. Ask your health care provider to suggest some techniques that may work well for you. These may include: Breathing exercises. Routines to release muscle tension. Visualizing peaceful scenes. Make sure that you drive carefully. Do not drive if you feel very sleepy. Keep all follow-up visits. This is important. Contact a health care provider if: You are tired throughout the day. You have trouble in your daily routine due to sleepiness. You continue to have sleep problems, or your sleep problems get worse. Get help right away if: You have thoughts about hurting yourself or someone else. Get help right away if you feel like you may hurt yourself or others, or have thoughts about taking your own life. Go to your nearest emergency room or: Call 911. Call the [facility] at [phone] or 988. This is open 24 hours a day. Text the Crisis Text Line at 601-511-5079. Summary Insomnia is a sleep disorder that makes it difficult to fall asleep or stay asleep. Insomnia can be long-term (chronic) or short-term (acute). Treatment for insomnia depends on the cause. Treatment may focus on treating an underlying condition that is causing the insomnia. Keep a sleep diary to help you and your health care provider figure out what could be causing your insomnia. This information is not intended to replace advice given to you by your health care provider. Make sure you discuss any questions you have with your health care provider. Document Revised: 05/11/2021 Document Reviewed: 05/11/2021 Elsevier  Patient Education  [place].

## 2022-01-26 NOTE — Telephone Encounter (Signed)
Error

## 2022-02-19 ENCOUNTER — Telehealth: Payer: Self-pay | Admitting: Cardiovascular Disease

## 2022-02-19 NOTE — Telephone Encounter (Signed)
Pt c/o BP issue: STAT if pt c/o blurred vision, one-sided weakness or slurred speech  1. What are your last 5 BP readings?  Readings are at home  2. Are you having any other symptoms (ex. Dizziness, headache, blurred vision, passed out)?  Very tired  3. What is your BP issue?   Patient states her BP has been very low, ranging from 105/55-60 to 120/80. She also mentions she has been tired.

## 2022-02-19 NOTE — Telephone Encounter (Signed)
*  STAT* If patient is at the pharmacy, call can be transferred to refill team.   1. Which medications need to be refilled? (please list name of each medication and dose if known)  Nebivolol 2.5 MG tablet  2. Which pharmacy/location (including street and city if local pharmacy) is medication to be sent to? [place], [place]  3. Do they need a 30 day or 90 day supply?  90 day supply

## 2022-02-19 NOTE — Telephone Encounter (Signed)
LMTCB

## 2022-02-22 ENCOUNTER — Telehealth: Payer: Self-pay | Admitting: Cardiovascular Disease

## 2022-02-22 MED ORDER — NEBIVOLOL HCL 2.5 MG PO TABS: 2.5000 mg | ORAL_TABLET | Freq: Every day | ORAL | 0 refills | Status: DC

## 2022-02-22 NOTE — Telephone Encounter (Signed)
Patient returned call, see phone note dated 02/19/22

## 2022-02-22 NOTE — Telephone Encounter (Signed)
Left a message for the patient to call back.  

## 2022-02-23 ENCOUNTER — Ambulatory Visit (HOSPITAL_COMMUNITY)
Admission: EM | Admit: 2022-02-23 | Discharge: 2022-02-23 | Disposition: A | Discharge status: Home / Self Care | Payer: Medicare Other | Attending: Emergency Medicine | Admitting: Emergency Medicine

## 2022-02-23 ENCOUNTER — Encounter (HOSPITAL_COMMUNITY): Payer: Self-pay | Admitting: *Deleted

## 2022-02-23 DIAGNOSIS — R001 Bradycardia, unspecified: Secondary | ICD-10-CM

## 2022-02-23 NOTE — Telephone Encounter (Signed)
Patient is currently in the ED.

## 2022-02-23 NOTE — ED Triage Notes (Signed)
Pt states that the last couple weeks her BP was been low and she is very fatigued. Her fitbit is constantly saying her heart is low and she needs to consult a MD.

## 2022-02-23 NOTE — ED Provider Notes (Signed)
[place]    CSN: [id number] Arrival date & time: 02/23/22  1437      History   Chief Complaint Chief Complaint  Patient presents with   low blood pressure    HPI Elizabeth Walker is a 76 y.o. female.   Bp low   Patient presents with intermittent dizziness dominantly when changing positions, fatigue and malaise for at least 1 week.  Denies syncope.  Endorses that her Fitbit has been alerting that her heart rate has been in the 40s and recommended she be seen by Dr.  Has been recording blood pressure at home and it has been lower than baseline with systolic in the low 948N to 90s.  Taking daily irbesartan, chlorthalidone and Bystolic.  Endorses that she has had similar symptoms in the past and at that time her Bystolic was reduced from 5 mg to 2.5 mg.  History of hypertension.   Past Medical History:  Diagnosis Date   Anxiety    Arthritis    Depression    Esophageal spasm    a. pt reports prior GI study demonstrating this.   GERD (gastroesophageal reflux disease)    Hypercholesteremia    Hypertension    Hypothyroid    Migraine    Osteoporosis    Pneumonia    PONV (postoperative nausea and vomiting)    Sinusitis    Sleep apnea    a. intolerant to CPAP.   Tachycardia    a. suspected due to sinus tach. Event monitor/echo unremarkable.    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   Dizziness 10/03/2019   OSA (obstructive sleep apnea) 08/20/2019   Slow heart rate 05/24/2019   Obstructive sleep apnea 03/09/2019   Chronic insomnia 08/29/2017   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   Excessive daytime sleepiness 08/29/2017    Intolerance of continuous positive airway pressure (CPAP) ventilation 08/29/2017   Sinusitis, chronic 01/02/2015   Cough variant asthma 06/18/2014   Dyspnea on exertion 05/13/2014   Hypercholesteremia    Atypical chest pain 04/08/2014   Asthma with acute exacerbation 04/03/2014   PNA (pneumonia) 04/02/2014   Hypokalemia 04/02/2014   CAP (community acquired pneumonia) 04/02/2014   Hypothyroidism 04/10/2013   Tachycardia 04/22/2011   HYPERLIPIDEMIA 12/04/2007   Essential hypertension 12/04/2007   ALLERGIC RHINITIS 12/04/2007   ASTHMA 12/04/2007   Headache 12/04/2007   Cough 12/04/2007    Past Surgical History:  Procedure Laterality Date   AUGMENTATION MAMMAPLASTY Bilateral    BREAST ENHANCEMENT SURGERY     cataracts  2016   CERVICAL SPINE SURGERY     C5-7 ACDF 12/23/2015   CESAREAN SECTION  1967   COMBINED AUGMENTATION MAMMAPLASTY AND ABDOMINOPLASTY     DRUG INDUCED ENDOSCOPY N/A 07/21/2018   Procedure: DRUG INDUCED ENDOSCOPY;  Surgeon: Jerrell Belfast, MD;  Location: [place];  Service: ENT;  Laterality: N/A;  Drug induced sleep endoscopy   HEMORROIDECTOMY  2009   IMPLANTATION OF HYPOGLOSSAL NERVE STIMULATOR  03/09/2019   [id number]   IMPLANTATION OF HYPOGLOSSAL NERVE STIMULATOR Right 03/09/2019   Procedure: IMPLANTATION OF HYPOGLOSSAL NERVE STIMULATOR;  Surgeon: Jerrell Belfast, MD;  Location: [place];  Service: ENT;  Laterality: Right;  OB History   No obstetric history on file.      Home Medications    Prior to Admission medications   Medication Sig Start Date End Date Taking? Authorizing Provider  acetaminophen (TYLENOL) 500 MG tablet Take 500 mg by mouth every 6 (six) hours as needed (for pain.).    Yes [provider]  atorvastatin (LIPITOR) 40 MG tablet TAKE ONE TABLET BY MOUTH ONE TIME DAILY  Patient taking differently: Take 40 mg by mouth daily. 04/09/14  Yes Elayne Snare, MD  chlorthalidone (HYGROTON) 25 MG tablet Take 25 mg by  mouth daily. 05/21/20  Yes [provider]  denosumab (PROLIA) 60 MG/ML SOSY injection Inject 60 mg into the skin every 6 (six) months.   Yes [provider]  ergocalciferol (VITAMIN D2) 50000 UNITS capsule Take 50,000 Units by mouth every Saturday.   Yes [provider]  fluticasone (FLONASE) 50 MCG/ACT nasal spray Place 1-2 sprays into both nostrils daily as needed for allergies or rhinitis.   Yes [provider]  levothyroxine (SYNTHROID, LEVOTHROID) 100 MCG tablet TAKE 1 TABLET BY MOUTH ONCE DAILY Patient taking differently: Take 100 mcg by mouth daily before breakfast. 07/15/17  Yes Elayne Snare, MD  LORazepam (ATIVAN) 0.5 MG tablet For insomnia. 01/25/22  Yes Dohmeier, Asencion Partridge, MD  meclizine (ANTIVERT) 25 MG tablet Take 1 tablet (25 mg total) by mouth 3 (three) times daily as needed for dizziness. 06/03/18  Yes Isla Pence, MD  nebivolol (BYSTOLIC) 2.5 MG tablet Take 1 tablet (2.5 mg total) by mouth daily. Needs appointment before refills. 02/22/22  Yes Lorretta Harp, MD  omeprazole (PRILOSEC) 20 MG capsule Take 20 mg by mouth daily before breakfast.   Yes [provider]  ondansetron (ZOFRAN-ODT) 4 MG disintegrating tablet Take 1 tablet (4 mg total) by mouth every 8 (eight) hours as needed for nausea or vomiting. 12/16/21  Yes Barrett Henle, MD  Probiotic Product (ACIDOPHILUS/GOAT MILK) CAPS Take by mouth.   Yes [provider]  guaiFENesin-codeine (CHERATUSSIN AC) 100-10 MG/5ML syrup Take 5 mLs by mouth 4 (four) times daily as needed for cough. 12/16/21   Barrett Henle, MD  irbesartan (AVAPRO) 300 MG tablet Take 300 mg by mouth every morning.     [provider]    Family History Family History  Problem Relation Age of Onset   Coronary artery disease Other    CAD Father        died at 73 - massive heart attack   Emphysema Father        smoked   Asthma Father    Heart disease Father    Heart disease Sister         had open heart surgery age 15   Diabetes Sister    Asthma Brother    CAD Brother        had open heart surgery at 25   Kidney failure Brother    Stroke Sister    Sjogren's syndrome Mother     Social History Social History   Tobacco Use   Smoking status: Never   Smokeless tobacco: Never  Vaping Use   Vaping Use: Never used  Substance Use Topics   Alcohol use: Yes    Comment: rare wine   Drug use: No     Allergies   Pollen extract and Codeine   Review of Systems Review of Systems Defer to HPI   Physical Exam Triage Vital Signs ED Triage Vitals  Enc Vitals  Group     BP 02/23/22 1618 118/69     Pulse Rate 02/23/22 1618 (!) 47     Resp 02/23/22 1618 18     Temp 02/23/22 1618 97.9 F (36.6 C)     Temp Source 02/23/22 1618 Oral     SpO2 02/23/22 1618 97 %     Weight --      Height --      Head Circumference --      Peak Flow --      Pain Score 02/23/22 1616 0     Pain Loc --      Pain Edu? --      Excl. in [place]? --    No data found.  Updated Vital Signs BP 118/69 (BP Location: Right Arm)   Pulse (!) 47   Temp 97.9 F (36.6 C) (Oral)   Resp 18   SpO2 97%   Visual Acuity Right Eye Distance:   Left Eye Distance:   Bilateral Distance:    Right Eye Near:   Left Eye Near:    Bilateral Near:     Physical Exam Constitutional:      Appearance: Normal appearance.  HENT:     Head: Normocephalic.  Eyes:     Extraocular Movements: Extraocular movements intact.  Cardiovascular:     Rate and Rhythm: Normal rate and regular rhythm.     Pulses: Normal pulses.     Heart sounds: Normal heart sounds.  Pulmonary:     Effort: Pulmonary effort is normal.     Breath sounds: Normal breath sounds.  Skin:    General: Skin is warm and dry.  Neurological:     Mental Status: She is alert and oriented to person, place, and time. Mental status is at baseline.  Psychiatric:        Mood and Affect: Mood normal.        Behavior: Behavior normal.      UC  Treatments / Results  Labs (all labs ordered are listed, but only abnormal results are displayed) Labs Reviewed - No data to display  EKG   Radiology No results found.  Procedures Procedures (including critical care time)  Medications Ordered in UC Medications - No data to display  Initial Impression / Assessment and Plan / UC Course  I have reviewed the triage vital signs and the nursing notes.  Pertinent labs & imaging results that were available during my care of the patient were reviewed by me and considered in my medical decision making (see chart for details).  Sinus bradycardia  Confirmed by EKG, vital signs are stable with the pulse rate of 47 noted in triage, patient is currently in no signs of distress nor toxic appearing, advised to stop Bystolic, advised to continue daily recordings of blood pressure and heart rate and take to upcoming cardiology appointment which is approximately in 1 month, advised patient to attempt to get seen sooner, given strict precautions that at any point if symptoms worsen she is to go to the nearest emergency department for further evaluation work-up Final Clinical Impressions(s) / UC Diagnoses   Final diagnoses:  None     Discharge Instructions      Your EKG shows that your heart is beating in a regular rhythm but it is beating slower  You are currently not having any symptoms and therefore you will need to follow-up with your cardiologist,  please call and schedule an appointment  Because your heart is beating slower than  normal we will not restart your blood pressure medication  At any point if you begin to have dizziness, lightheadedness, feel as if you will pass out you will need to go to the nearest emergency department for immediate evaluation   ED Prescriptions   None    PDMP not reviewed this encounter.   Hans Eden, NP 02/23/22 1731

## 2022-02-23 NOTE — Discharge Instructions (Addendum)
Your EKG shows that your heart is beating in a regular rhythm but it is beating slower  You are currently not having any symptoms and therefore you will need to follow-up with your cardiologist,  please call and schedule an appointment  Because your heart is beating slower than normal stop taking the nebivilol and continue to take irbesartan chlorthalidone  Please continue to monitor your blood pressure and record until your cardiologist appointment  At any point if you begin to have dizziness, lightheadedness, feel as if you will pass out you will need to go to the nearest emergency department for immediate evaluation

## 2022-02-24 ENCOUNTER — Ambulatory Visit (HOSPITAL_COMMUNITY): Payer: Self-pay

## 2022-02-24 ENCOUNTER — Ambulatory Visit: Payer: Medicare Other | Attending: Cardiovascular Disease | Admitting: Cardiovascular Disease

## 2022-02-24 ENCOUNTER — Encounter: Payer: Self-pay | Admitting: Cardiovascular Disease

## 2022-02-24 DIAGNOSIS — I10 Essential (primary) hypertension: Secondary | ICD-10-CM | POA: Diagnosis not present

## 2022-02-24 MED ORDER — CHLORTHALIDONE 25 MG PO TABS: 12.5000 mg | ORAL_TABLET | Freq: Every day | ORAL | 3 refills | Status: DC

## 2022-02-24 MED ORDER — IRBESARTAN 150 MG PO TABS: 150.0000 mg | ORAL_TABLET | Freq: Every morning | ORAL | 3 refills | Status: DC

## 2022-02-24 NOTE — Patient Instructions (Signed)
Medication Instructions:   -Nebivolol (bystolic) 2.'5mg'$  take one every other day for 1 week. Then take one every 3rd day for 1 week then stop.  -Decrease chlorthalidone (hygroton) to 12.'5mg'$  once daily.  -Decrease irbesartan (avapro) to '150mg'$  once daily.  *If you need a refill on your cardiac medications before your next appointment, please call your pharmacy*   Follow-Up: At [facility], you and your health needs are our priority.  As part of our continuing mission to provide you with exceptional heart care, we have created designated Provider Care Teams.  These Care Teams include your primary Cardiologist (physician) and Advanced Practice Providers (APPs -  Physician Assistants and Nurse Practitioners) who all work together to provide you with the care you need, when you need it.  We recommend signing up for the patient portal called "MyChart".  Sign up information is provided on this After Visit Summary.  MyChart is used to connect with patients for Virtual Visits (Telemedicine).  Patients are able to view lab/test results, encounter notes, upcoming appointments, etc.  Non-urgent messages can be sent to your provider as well.   To learn more about what you can do with MyChart, go to [email or web address].    Your next appointment:   6 month(s)  The format for your next appointment:   In Person  Provider:   Quay Burow, MD    Other Instructions Dr. Gwenlyn Found has requested that you schedule an appointment with one of our clinical pharmacists for a blood pressure check appointment within the next 4 weeks.  If you monitor your blood pressure (BP) at home, please bring your BP cuff and your BP readings with you to this appointment  HOW TO TAKE YOUR BLOOD PRESSURE: Rest 5 minutes before taking your blood pressure. Don't smoke or drink caffeinated beverages for at least 30 minutes before. Take your blood pressure before (not after) you eat. Sit comfortably with your back  supported and both feet on the floor (don't cross your legs). Elevate your arm to heart level on a table or a desk. Use the proper sized cuff. It should fit smoothly and snugly around your bare upper arm. There should be enough room to slip a fingertip under the cuff. The bottom edge of the cuff should be 1 inch above the crease of the elbow. Ideally, take 3 measurements at one sitting and record the average.

## 2022-02-24 NOTE — Assessment & Plan Note (Signed)
History of hyperlipidemia on statin therapy with lipid profile performed 06/18/2021 revealing total cholesterol 55, LDL 98 and HDL 41.

## 2022-02-24 NOTE — Progress Notes (Signed)
02/24/2022 Elizabeth Walker   03/18/1946  [id number]  Primary Physician Crist Infante, MD Primary Cardiologist: Lorretta Harp MD Garret Reddish, [place], [place]  HPI:  Elizabeth Walker is a 76 y.o.  thin and fit appearing married Caucasian female mother of 2, grandmother of 7 grandchildren who worked last at [employer] and [employer]. she is accompanied by her husband Elizabeth Walker today.  She was referred by her PCP, Dr. Joylene Draft for cardiovascular valuation because of bradycardia.  She has seen Dr. Rayann Heman in the past 08/20/2011 for palpitations.    I last saw her in the office 12/31/2020.  Her cardiac risk factor profile is notable for treated hypertension hyperlipidemia.  Her father did die of a myocardial infarction at age 6 and her mother had bypass surgery and valve replacement.  History of bypass surgery as well.  She had a negative Myoview stress test back in 2015 and a recent coronary calcium score of 0.  She does complain of some shortness of breath but denies chest pain.  She has been on multiple antihypertensive medications but is intolerant to calcium channel blockers causing peripheral edema.  She has been on hydralazine as well.  Currently she is on Bystolic, Avapro and chlorthalidone.  Her heart rate runs in the 40s.  She complains of chronic fatigue.  She also has episodes of presyncope.   A 2D echo was performed that was essentially normal.  Event monitor was also placed that revealed episodes of sinus bradycardia.  Based on this I did decrease her Bystolic from 5 to 2.5 mg a day which has resulted in an increase in her heart rate up in the 60 range.  She continues to have episodic presyncope although this did not occur while she was wearing her 30-day event monitor.  She also complains of some dyspnea on exertion.  She has had atypical chest pain in the past and had a coronary calcium score performed 05/01/2019 which was 0.3.  She had a loop recorder implanted by Dr. Sallyanne Kuster 10/24/2019 which has not shown any  arrhythmias.  Episodes of dizziness have resolved.  She no longer has atypical chest pain either.  Since I saw her a year ago she has had symptomatic bradycardia and hypotension.  She is seen in the emergency room for this yesterday with heart rates in the 40s and blood pressures in the 31D systolic.  Her loop recorder has been unrevealing.   Current Meds  Medication Sig   acetaminophen (TYLENOL) 500 MG tablet Take 500 mg by mouth every 6 (six) hours as needed (for pain.).    atorvastatin (LIPITOR) 40 MG tablet TAKE ONE TABLET BY MOUTH ONE TIME DAILY  (Patient taking differently: Take 40 mg by mouth daily.)   chlorthalidone (HYGROTON) 25 MG tablet Take 25 mg by mouth daily.   denosumab (PROLIA) 60 MG/ML SOSY injection Inject 60 mg into the skin every 6 (six) months.   ergocalciferol (VITAMIN D2) 50000 UNITS capsule Take 50,000 Units by mouth every Saturday.   fluticasone (FLONASE) 50 MCG/ACT nasal spray Place 1-2 sprays into both nostrils daily as needed for allergies or rhinitis.   guaiFENesin-codeine (CHERATUSSIN AC) 100-10 MG/5ML syrup Take 5 mLs by mouth 4 (four) times daily as needed for cough.   irbesartan (AVAPRO) 300 MG tablet Take 300 mg by mouth every morning.    levothyroxine (SYNTHROID, LEVOTHROID) 100 MCG tablet TAKE 1 TABLET BY MOUTH ONCE DAILY (Patient taking differently: Take 100 mcg by mouth daily before breakfast.)  LORazepam (ATIVAN) 0.5 MG tablet For insomnia.   meclizine (ANTIVERT) 25 MG tablet Take 1 tablet (25 mg total) by mouth 3 (three) times daily as needed for dizziness.   omeprazole (PRILOSEC) 20 MG capsule Take 20 mg by mouth daily before breakfast.   ondansetron (ZOFRAN-ODT) 4 MG disintegrating tablet Take 1 tablet (4 mg total) by mouth every 8 (eight) hours as needed for nausea or vomiting.   Probiotic Product (ACIDOPHILUS/GOAT MILK) CAPS Take by mouth.   [DISCONTINUED] nebivolol (BYSTOLIC) 2.5 MG tablet Take 1 tablet (2.5 mg total) by mouth daily. Needs  appointment before refills.   Current Facility-Administered Medications for the 02/24/22 encounter (Office Visit) with Lorretta Harp, MD  Medication   lidocaine-EPINEPHrine (XYLOCAINE W/EPI) 1 %-1:100000 (with pres) injection 10 mL     Allergies  Allergen Reactions   Pollen Extract Other (See Comments)    Stuffy nose   Codeine Nausea And Vomiting    Social History   Socioeconomic History   Marital status: Married    Spouse name: Not on file   Number of children: Not on file   Years of education: Not on file   Highest education level: Not on file  Occupational History   Not on file  Tobacco Use   Smoking status: Never   Smokeless tobacco: Never  Vaping Use   Vaping Use: Never used  Substance and Sexual Activity   Alcohol use: Yes    Comment: rare wine   Drug use: No   Sexual activity: Not on file  Other Topics Concern   Not on file  Social History Narrative   Pt lives in [place] with spouse.  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   Social Determinants of Health   Financial Resource Strain: Not on file  Food Insecurity: Not on file  Transportation Needs: Not on file  Physical Activity: Not on file  Stress: Not on file  Social Connections: Not on file  Intimate Partner Violence: Not on file     Review of Systems: General: negative for chills, fever, night sweats or weight changes.  Cardiovascular: negative for chest pain, dyspnea on exertion, edema, orthopnea, palpitations, paroxysmal nocturnal dyspnea or shortness of breath Dermatological: negative for rash Respiratory: negative for cough or wheezing Urologic: negative for hematuria Abdominal: negative for nausea, vomiting, diarrhea, bright Walker blood per rectum, melena, or hematemesis Neurologic: negative for visual changes, syncope, or dizziness All other systems reviewed and are otherwise negative except as noted above.    Blood pressure 104/60, pulse (!) 49, height 5' 0.5" (1.537 m), weight 104  lb 12.8 oz (47.5 kg), SpO2 100 %.  General appearance: alert and no distress Neck: no adenopathy, no carotid bruit, no JVD, supple, symmetrical, trachea midline, and thyroid not enlarged, symmetric, no tenderness/mass/nodules Lungs: clear to auscultation bilaterally Heart: regular rate and rhythm, S1, S2 normal, no murmur, click, rub or gallop Extremities: extremities normal, atraumatic, no cyanosis or edema Pulses: 2+ and symmetric Skin: Skin color, texture, turgor normal. No rashes or lesions Neurologic: Grossly normal  EKG not performed today  ASSESSMENT AND PLAN:   HYPERLIPIDEMIA History of hyperlipidemia on statin therapy with lipid profile performed 06/18/2021 revealing total cholesterol 55, LDL 98 and HDL 41.  Essential hypertension History of essential hypertension a blood pressure measured today at 104/60.  She is on multiple antihypertensive medications: Bystolic, Avapro and chlorthalidone.  I believe she is overmedicated and symptomatic from hypotension.  She is also bradycardic with heart rates in the 40s and 50s.  I am going to wean her off the Bystolic and cut her Avapro and chlorthalidone in half.  She will keep a blood pressure log for 30 days and will see a Pharm.D. back after that to review and make appropriate adjustments.     Lorretta Harp MD FACP,FACC,FAHA, [facility] 02/24/2022 2:43 PM

## 2022-02-24 NOTE — Assessment & Plan Note (Signed)
History of essential hypertension a blood pressure measured today at 104/60.  She is on multiple antihypertensive medications: Bystolic, Avapro and chlorthalidone.  I believe she is overmedicated and symptomatic from hypotension.  She is also bradycardic with heart rates in the 40s and 50s.  I am going to wean her off the Bystolic and cut her Avapro and chlorthalidone in half.  She will keep a blood pressure log for 30 days and will see a Pharm.D. back after that to review and make appropriate adjustments.

## 2022-03-24 ENCOUNTER — Ambulatory Visit: Payer: Medicare Other | Attending: Cardiology | Admitting: Pharmacist Clinician (PhC)/ Clinical Pharmacy Specialist

## 2022-03-24 ENCOUNTER — Ambulatory Visit: Payer: Medicare Other | Admitting: Cardiovascular Disease

## 2022-03-24 ENCOUNTER — Encounter: Payer: Self-pay | Admitting: Pharmacist Clinician (PhC)/ Clinical Pharmacy Specialist

## 2022-03-24 DIAGNOSIS — I10 Essential (primary) hypertension: Secondary | ICD-10-CM | POA: Diagnosis not present

## 2022-03-24 NOTE — Assessment & Plan Note (Signed)
Patient with essential hypertension, readings more elevated since cutting back on medications.  She is still having a few episodic hypotensive episodes, but not nearly as often.  She will resume chlorthalidone at 25 mg daily and leave other meds unchanged.  She should check blood pressure at home most days in the mornings, and later in the day if she is feeling symptomatic hypotension.  I also asked that she make any notes of activities when these episodes occur.  Checked standing BP at 1 minute and it was unchanged from seated reading.  I will see her back in 6 weeks for follow up.

## 2022-03-24 NOTE — Progress Notes (Signed)
03/24/2022 Elizabeth Walker 1945-10-23 [id number]   HPI:  Elizabeth Walker is a 76 y.o. female patient of Dr Gwenlyn Found, with a [place] below who presents today for hypertension clinic evaluation.  [name] was seen by Dr. Gwenlyn Found last month and found to have a blood pressure of 104/60 and heart rate of 49.  She was on nebivolol, irbesartan and chlorthalidone.  He weaned her off the nebivolol and cut the other two medications by half.  She was asked to monitor her BP at home for a month and return.  Today she is in the office for evaluation.  Since cutting back on her medications she has noticed fewer hypotensive BP readings.  She also notes that her watch no longer alarms her in the middle of the night for low blood pressure.  She had 2 episodes of hypotension since her meds were changed, noting that one was after working in her rose garden and the other after cleaning the shower.  Admits doesn't drink as much water as she did when working, but tries to stay hydrated.    Past Medical History: hyperlipidemia 1/23 LDL 98 on atorvastatin 40 mg  OSA   hypothyroidism 1/23 TSH 1.77 on levothyroxine 100 mcg     156/77   Blood Pressure Goal:  130/80  Current Medications:   irbesartan 150 mg qd, chlorthalidone 12.5 mg qd  Family Hx:   father died MI 45's;  sisterw ith CABG in her 14's, brothe w/cabg x 4 in his 58's died; children healthy  Social Hx:    no, rare alcohol, not much caffeine  Diet: eats out 3-4 times per week; not much fried; no added salt at table; good  protiens, veggies fresh;   Exercise: walks most days about 1 mile  Home BP readings:  home cuff 92-38 years old arm cuff  32 readings average 125/71 (range 87-165/52-94)  HR average 62  Intolerances:  no cardiac medication intolerances  Labs: 1/23: Na 141, K 4.1, BUN 34, GFR 48.2   Wt Readings from Last 3 Encounters:  02/24/22 104 lb 12.8 oz (47.5 kg)  05/04/21 103 lb (46.7 kg)  12/31/20 110 lb 6.4 oz (50.1 kg)   BP Readings  from Last 3 Encounters:  03/24/22 (!) 156/77  02/24/22 104/60  02/23/22 118/69   Pulse Readings from Last 3 Encounters:  03/24/22 (!) 56  02/24/22 (!) 49  02/23/22 (!) 47    Current Outpatient Medications  Medication Sig Dispense Refill   atorvastatin (LIPITOR) 40 MG tablet TAKE ONE TABLET BY MOUTH ONE TIME DAILY  (Patient taking differently: Take 40 mg by mouth daily.) 90 tablet 0   chlorthalidone (HYGROTON) 25 MG tablet Take 0.5 tablets (12.5 mg total) by mouth daily. 45 tablet 3   denosumab (PROLIA) 60 MG/ML SOSY injection Inject 60 mg into the skin every 6 (six) months.     ergocalciferol (VITAMIN D2) 50000 UNITS capsule Take 50,000 Units by mouth every Saturday.     fluticasone (FLONASE) 50 MCG/ACT nasal spray Place 1-2 sprays into both nostrils daily as needed for allergies or rhinitis.     irbesartan (AVAPRO) 150 MG tablet Take 1 tablet (150 mg total) by mouth every morning. 90 tablet 3   levothyroxine (SYNTHROID, LEVOTHROID) 100 MCG tablet TAKE 1 TABLET BY MOUTH ONCE DAILY (Patient taking differently: Take 100 mcg by mouth daily before breakfast.) 30 tablet 0   LORazepam (ATIVAN) 0.5 MG tablet For insomnia. 30 tablet 1   meclizine (  ANTIVERT) 25 MG tablet Take 1 tablet (25 mg total) by mouth 3 (three) times daily as needed for dizziness. 30 tablet 0   omeprazole (PRILOSEC) 20 MG capsule Take 20 mg by mouth daily before breakfast.     ondansetron (ZOFRAN-ODT) 4 MG disintegrating tablet Take 1 tablet (4 mg total) by mouth every 8 (eight) hours as needed for nausea or vomiting. 10 tablet 0   Probiotic Product (ACIDOPHILUS/GOAT MILK) CAPS Take by mouth.     acetaminophen (TYLENOL) 500 MG tablet Take 500 mg by mouth every 6 (six) hours as needed (for pain.).      Current Facility-Administered Medications  Medication Dose Route Frequency Provider Last Rate Last Admin   lidocaine-EPINEPHrine (XYLOCAINE W/EPI) 1 %-1:100000 (with pres) injection 10 mL  10 mL Infiltration Once Croitoru,  Mihai, MD        Allergies  Allergen Reactions   Pollen Extract Other (See Comments)    Stuffy nose   Codeine Nausea And Vomiting    Past Medical History:  Diagnosis Date   Anxiety    Arthritis    Depression    Esophageal spasm    a. pt reports prior GI study demonstrating this.   GERD (gastroesophageal reflux disease)    Hypercholesteremia    Hypertension    Hypothyroid    Migraine    Osteoporosis    Pneumonia    PONV (postoperative nausea and vomiting)    Sinusitis    Sleep apnea    a. intolerant to CPAP.   Tachycardia    a. suspected due to sinus tach. Event monitor/echo unremarkable.    Blood pressure (!) 156/77, pulse (!) 56.  HYPERTENSION CONTROL Vitals:   03/24/22 1446 03/24/22 1528  BP: (!) 150/62 (!) 156/77    The patient's blood pressure is elevated above target today.  In order to address the patient's elevated BP: A current anti-hypertensive medication was adjusted today.      Essential hypertension Patient with essential hypertension, readings more elevated since cutting back on medications.  She is still having a few episodic hypotensive episodes, but not nearly as often.  She will resume chlorthalidone at 25 mg daily and leave other meds unchanged.  She should check blood pressure at home most days in the mornings, and later in the day if she is feeling symptomatic hypotension.  I also asked that she make any notes of activities when these episodes occur.  Checked standing BP at 1 minute and it was unchanged from seated reading.  I will see her back in 6 weeks for follow up.   Tommy Medal PharmD CPP [facility] [address] [place], [place] [address] [phone]

## 2022-03-24 NOTE — Patient Instructions (Signed)
Return for a a follow up appointment Tuesday November 28 at 1:30 pm  Check your blood pressure at home daily and keep record of the readings.  Take your BP meds as follows:  Increase chlorthalidone back to 1 tablet daily  Continue with the lower dose of irbesartan (150 mg)  Bring all of your meds, your BP cuff and your record of home blood pressures to your next appointment.  Exercise as you're able, try to walk approximately 30 minutes per day.  Keep salt intake to a minimum, especially watch canned and prepared boxed foods.  Eat more fresh fruits and vegetables and fewer canned items.  Avoid eating in fast food restaurants.    HOW TO TAKE YOUR BLOOD PRESSURE: Rest 5 minutes before taking your blood pressure.  Don't smoke or drink caffeinated beverages for at least 30 minutes before. Take your blood pressure before (not after) you eat. Sit comfortably with your back supported and both feet on the floor (don't cross your legs). Elevate your arm to heart level on a table or a desk. Use the proper sized cuff. It should fit smoothly and snugly around your bare upper arm. There should be enough room to slip a fingertip under the cuff. The bottom edge of the cuff should be 1 inch above the crease of the elbow. Ideally, take 3 measurements at one sitting and record the averag

## 2022-05-07 ENCOUNTER — Ambulatory Visit (HOSPITAL_COMMUNITY)
Admission: EM | Admit: 2022-05-07 | Discharge: 2022-05-07 | Disposition: A | Discharge status: Home / Self Care | Payer: Medicare Other | Attending: Internal Medicine | Admitting: Internal Medicine

## 2022-05-07 ENCOUNTER — Encounter (HOSPITAL_COMMUNITY): Payer: Self-pay | Admitting: *Deleted

## 2022-05-07 DIAGNOSIS — R051 Acute cough: Secondary | ICD-10-CM

## 2022-05-07 DIAGNOSIS — R0981 Nasal congestion: Secondary | ICD-10-CM

## 2022-05-07 DIAGNOSIS — U071 COVID-19: Secondary | ICD-10-CM

## 2022-05-07 MED ORDER — MOLNUPIRAVIR EUA 200MG CAPSULE: 4.0000 | ORAL_CAPSULE | Freq: Two times a day (BID) | ORAL | 0 refills | Status: AC

## 2022-05-07 MED ORDER — PROMETHAZINE-DM 6.25-15 MG/5ML PO SYRP: 2.5000 mL | ORAL_SOLUTION | Freq: Every evening | ORAL | 0 refills | Status: DC | PRN

## 2022-05-07 NOTE — ED Provider Notes (Signed)
[facility]    CSN: [id number] Arrival date & time: 05/07/22  1350      History   Chief Complaint Chief Complaint  Patient presents with   Cough   Nasal Congestion   Covid Positive    HPI Elizabeth Walker is a 76 y.o. female.   Patient presents urgent care for evaluation of cough, sore throat, and nasal congestion that started a couple of days ago.  Patient took a COVID-19 test at home and it was positive this morning.  She is not a smoker and denies history of chronic respiratory problems.  She has had COVID-19 in the past and has never been hospitalized related to viral illness.  No known sick contacts.  Cough is dry, nonproductive, and worse at nighttime.  Nasal congestion is thick and green.  Patient is concerned that she may have a sinus infection and is requesting antibiotic therapy at this time due to history of "severe sinus infections".  No recent antibiotic or steroid use.  She is not a smoker and denies drug use.Denies chest pain, shortness of breath, headache, blurry vision, fever/chills, weakness, ear pain, neck pain, and bodyaches.  She has been taking NyQuil and Tylenol to help with her symptoms with some relief.     Cough   Past Medical History:  Diagnosis Date   Anxiety    Arthritis    Depression    Esophageal spasm    a. pt reports prior GI study demonstrating this.   GERD (gastroesophageal reflux disease)    Hypercholesteremia    Hypertension    Hypothyroid    Migraine    Osteoporosis    Pneumonia    PONV (postoperative nausea and vomiting)    Sinusitis    Sleep apnea    a. intolerant to CPAP.   Tachycardia    a. suspected due to sinus tach. Event monitor/echo unremarkable.    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   Dizziness 10/03/2019   OSA (obstructive sleep apnea) 08/20/2019   Slow heart rate 05/24/2019   Obstructive sleep apnea 03/09/2019   Chronic insomnia 08/29/2017   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   Excessive daytime sleepiness 08/29/2017   Intolerance of continuous positive airway pressure (CPAP) ventilation 08/29/2017   Sinusitis, chronic 01/02/2015   Cough variant asthma 06/18/2014   Dyspnea on exertion 05/13/2014   Hypercholesteremia    Atypical chest pain 04/08/2014   Asthma with acute exacerbation 04/03/2014   PNA (pneumonia) 04/02/2014   Hypokalemia 04/02/2014   CAP (community acquired pneumonia) 04/02/2014   Hypothyroidism 04/10/2013   Tachycardia 04/22/2011   HYPERLIPIDEMIA 12/04/2007   Essential hypertension 12/04/2007   ALLERGIC RHINITIS 12/04/2007   ASTHMA 12/04/2007   Headache 12/04/2007   Cough 12/04/2007    Past Surgical History:  Procedure Laterality Date   AUGMENTATION MAMMAPLASTY Bilateral    BREAST ENHANCEMENT SURGERY     cataracts  2016   CERVICAL SPINE SURGERY     C5-7 ACDF 12/23/2015   CESAREAN SECTION  1967   COMBINED AUGMENTATION MAMMAPLASTY AND ABDOMINOPLASTY     DRUG INDUCED ENDOSCOPY N/A 07/21/2018   Procedure: DRUG INDUCED ENDOSCOPY;  Surgeon: Jerrell Belfast, MD;  Location: [facility];  Service: ENT;  Laterality: N/A;  Drug induced sleep endoscopy   HEMORROIDECTOMY  2009   IMPLANTATION OF HYPOGLOSSAL NERVE STIMULATOR  03/09/2019   [id number]   IMPLANTATION OF HYPOGLOSSAL NERVE STIMULATOR Right 03/09/2019   Procedure: IMPLANTATION OF HYPOGLOSSAL NERVE STIMULATOR;  Surgeon: Jerrell Belfast, MD;  Location: [facility];  Service: ENT;  Laterality: Right;    OB History   No obstetric history on file.      Home Medications    Prior to Admission medications   Medication Sig Start Date End Date Taking?  Authorizing Provider  atorvastatin (LIPITOR) 40 MG tablet TAKE ONE TABLET BY MOUTH ONE TIME DAILY  Patient taking differently: Take 40 mg by mouth daily. 04/09/14  Yes Elayne Snare, MD  chlorthalidone (HYGROTON) 25 MG tablet Take 0.5 tablets (12.5 mg total) by mouth daily. 02/24/22  Yes Lorretta Harp, MD  denosumab (PROLIA) 60 MG/ML SOSY injection Inject 60 mg into the skin every 6 (six) months.   Yes [provider]  ergocalciferol (VITAMIN D2) 50000 UNITS capsule Take 50,000 Units by mouth every Saturday.   Yes [provider]  fluticasone (FLONASE) 50 MCG/ACT nasal spray Place 1-2 sprays into both nostrils daily as needed for allergies or rhinitis.   Yes [provider]  levothyroxine (SYNTHROID, LEVOTHROID) 100 MCG tablet TAKE 1 TABLET BY MOUTH ONCE DAILY Patient taking differently: Take 100 mcg by mouth daily before breakfast. 07/15/17  Yes Elayne Snare, MD  LORazepam (ATIVAN) 0.5 MG tablet For insomnia. 01/25/22  Yes Dohmeier, Asencion Partridge, MD  meclizine (ANTIVERT) 25 MG tablet Take 1 tablet (25 mg total) by mouth 3 (three) times daily as needed for dizziness. 06/03/18  Yes Isla Pence, MD  molnupiravir EUA (LAGEVRIO) 200 mg CAPS capsule Take 4 capsules (800 mg total) by mouth 2 (two) times daily for 5 days. 05/07/22 05/12/22 Yes Talbot Grumbling, FNP  Probiotic Product (ACIDOPHILUS/GOAT MILK) CAPS Take by mouth.   Yes [provider]  promethazine-dextromethorphan (PROMETHAZINE-DM) 6.25-15 MG/5ML syrup Take 2.5 mLs by mouth at bedtime as needed for cough. 05/07/22  Yes Talbot Grumbling, FNP  acetaminophen (TYLENOL) 500 MG tablet Take 500 mg by mouth every 6 (six) hours as needed (for pain.).     [provider]  irbesartan (AVAPRO) 150 MG tablet Take 1 tablet (150 mg total) by mouth every morning. 02/24/22   Lorretta Harp, MD  omeprazole (PRILOSEC) 20 MG capsule Take 20 mg by mouth daily before breakfast.    [provider]   ondansetron (ZOFRAN-ODT) 4 MG disintegrating tablet Take 1 tablet (4 mg total) by mouth every 8 (eight) hours as needed for nausea or vomiting. 12/16/21   Barrett Henle, MD    Family History Family History  Problem Relation Age of Onset   Sjogren's syndrome Mother    CAD Father        died at 74 - massive heart attack   Emphysema Father        smoked   Asthma Father    Heart disease Father    Heart disease Sister        had open heart surgery age 61   Diabetes Sister    Stroke Sister    Asthma Brother    CAD Brother        had open heart surgery at 66   Kidney failure Brother    Coronary artery disease Other     Social History Social History   Tobacco Use  Smoking status: Never   Smokeless tobacco: Never  Vaping Use   Vaping Use: Never used  Substance Use Topics   Alcohol use: Yes    Comment: rare wine   Drug use: No     Allergies   Pollen extract and Codeine   Review of Systems Review of Systems  Respiratory:  Positive for cough.   Per HPI   Physical Exam Triage Vital Signs ED Triage Vitals  Enc Vitals Group     BP 05/07/22 1459 (!) 157/75     Pulse Rate 05/07/22 1459 (!) 57     Resp 05/07/22 1459 18     Temp 05/07/22 1459 98.1 F (36.7 C)     Temp Source 05/07/22 1459 Oral     SpO2 05/07/22 1459 98 %     Weight --      Height --      Head Circumference --      Peak Flow --      Pain Score 05/07/22 1458 0     Pain Loc --      Pain Edu? --      Excl. in [place]? --    No data found.  Updated Vital Signs BP (!) 157/75 (BP Location: Right Arm)   Pulse (!) 57   Temp 98.1 F (36.7 C) (Oral)   Resp 18   SpO2 98%   Visual Acuity Right Eye Distance:   Left Eye Distance:   Bilateral Distance:    Right Eye Near:   Left Eye Near:    Bilateral Near:     Physical Exam Vitals and nursing note reviewed.  Constitutional:      Appearance: Normal appearance. She is not ill-appearing or toxic-appearing.  HENT:     Head: Normocephalic and  atraumatic.     Right Ear: Hearing, tympanic membrane, ear canal and external ear normal.     Left Ear: Hearing, tympanic membrane, ear canal and external ear normal.     Nose: Congestion present.     Mouth/Throat:     Lips: Pink.     Mouth: Mucous membranes are moist.     Pharynx: Posterior oropharyngeal erythema present.     Comments: Small amount of clear postnasal drainage visualized to the posterior oropharynx.  Eyes:     General: Lids are normal. Vision grossly intact. Gaze aligned appropriately.     Extraocular Movements: Extraocular movements intact.     Conjunctiva/sclera: Conjunctivae normal.     Pupils: Pupils are equal, round, and reactive to light.  Cardiovascular:     Rate and Rhythm: Normal rate and regular rhythm.     Heart sounds: Normal heart sounds, S1 normal and S2 normal.  Pulmonary:     Effort: Pulmonary effort is normal. No respiratory distress.     Breath sounds: Normal breath sounds and air entry. No wheezing.     Comments: No adventitious lung sounds heard to auscultation.  No respiratory distress. Abdominal:     General: Bowel sounds are normal.     Palpations: Abdomen is soft.     Tenderness: There is no abdominal tenderness. There is no right CVA tenderness, left CVA tenderness or guarding.  Musculoskeletal:     Cervical back: Neck supple.  Lymphadenopathy:     Cervical: Cervical adenopathy present.  Skin:    General: Skin is warm and dry.     Capillary Refill: Capillary refill takes less than 2 seconds.  Neurological:     General: No focal deficit present.  Mental Status: She is alert and oriented to person, place, and time. Mental status is at baseline.     Cranial Nerves: No dysarthria or facial asymmetry.     Motor: No weakness.     Gait: Gait normal.  Psychiatric:        Mood and Affect: Mood normal.        Speech: Speech normal.        Behavior: Behavior normal.        Thought Content: Thought content normal.        Judgment: Judgment  normal.      UC Treatments / Results  Labs (all labs ordered are listed, but only abnormal results are displayed) Labs Reviewed - No data to display  EKG   Radiology No results found.  Procedures Procedures (including critical care time)  Medications Ordered in UC Medications - No data to display  Initial Impression / Assessment and Plan / UC Course  I have reviewed the triage vital signs and the nursing notes.  Pertinent labs & imaging results that were available during my care of the patient were reviewed by me and considered in my medical decision making (see chart for details).   1.  COVID-19 Molnupiravir sent to pharmacy as patient is at increased risk for severe disease related to COVID-19.  Deferred imaging based on stable cardiopulmonary exam and hemodynamically stable vital signs at time of visit.  Will manage all other symptoms with prescriptions for supportive care.  Patient may purchase guaifenesin over-the-counter at home and take this twice a day to help with nasal mucus and cough.  Advised saline nasal sprays and use of Tylenol as needed to help with discomfort related to viral illness.  May use Promethazine DM cough syrup at nighttime to help with cough, drowsiness precautions discussed related to Promethazine DM.  Nonpharmacologic methods of sore throat relief provided in AVS.  Strict ER return precautions discussed.  COVID-19 quarantine guidelines discussed.  She is nontoxic in appearance and appears to be well-hydrated at this time.  Advise increase water intake to stay well-hydrated while recovering from viral illness.  Discussed physical exam and available lab work findings in clinic with patient.  Counseled patient regarding appropriate use of medications and potential side effects for all medications recommended or prescribed today. Discussed red flag signs and symptoms of worsening condition,when to call the PCP office, return to urgent care, and when to seek  higher level of care in the emergency department. Patient verbalizes understanding and agreement with plan. All questions answered. Patient discharged in stable condition.   Final Clinical Impressions(s) / UC Diagnoses   Final diagnoses:  COVID-19  Acute cough  Nasal congestion     Discharge Instructions      You have a viral upper respiratory infection.  Quarantine at home until day 6 of symptoms.  Once it is day 6, you may go back into being in public but you must wear a mask until day 11 of symptoms.  Take molnupiravir antiviral for the next 5 days twice daily as prescribed.  Take guaifenesin '1200mg'$   2 times daily to thin your mucous so that you can cough it up and blow it out of your nose easier. Drink plenty of water while taking this medication so that it works well in your body (at least 8 cups a day).   Take Promethazine DM cough medication to help with your cough at nighttime so that you are able to sleep. Do not drive, drink  alcohol, or go to work while taking this medication since it can make you sleepy. Only take this at nighttime.   You may take tylenol 1,'000mg'$  every 6 hours as needed for fever, chills, and bodyaches.  You may do salt water and baking soda gargles every 4 hours as needed for your throat pain.  Please put 1 teaspoon of salt and 1/2 teaspoon of baking soda in 8 ounces of warm water then gargle and spit the water out. You may also put 1 tablespoon of honey in warm water and drink this to soothe your throat.  Place a humidifier in your room at night to help decrease dry air that can irritate your airway and cause you to have a sore throat and cough.  Please try to eat a well-balanced diet while you are sick so that your body gets proper nutrition to heal.  If you develop any new or worsening symptoms, please return.  If your symptoms are severe, please go to the emergency room.  Follow-up with your primary care provider for further evaluation and management of  your symptoms as well as ongoing wellness visits.  I hope you feel better!    ED Prescriptions     Medication Sig Dispense Auth. Provider   molnupiravir EUA (LAGEVRIO) 200 mg CAPS capsule Take 4 capsules (800 mg total) by mouth 2 (two) times daily for 5 days. 40 capsule Talbot Grumbling, [place]   promethazine-dextromethorphan (PROMETHAZINE-DM) 6.25-15 MG/5ML syrup Take 2.5 mLs by mouth at bedtime as needed for cough. 118 mL Talbot Grumbling, FNP      PDMP not reviewed this encounter.   Talbot Grumbling, [place] 05/07/22 [phone]

## 2022-05-07 NOTE — Discharge Instructions (Addendum)
You have a viral upper respiratory infection.  Quarantine at home until day 6 of symptoms.  Once it is day 6, you may go back into being in public but you must wear a mask until day 11 of symptoms.  Take molnupiravir antiviral for the next 5 days twice daily as prescribed.  Take guaifenesin '1200mg'$   2 times daily to thin your mucous so that you can cough it up and blow it out of your nose easier. Drink plenty of water while taking this medication so that it works well in your body (at least 8 cups a day).   Take Promethazine DM cough medication to help with your cough at nighttime so that you are able to sleep. Do not drive, drink alcohol, or go to work while taking this medication since it can make you sleepy. Only take this at nighttime.   You may take tylenol 1,'000mg'$  every 6 hours as needed for fever, chills, and bodyaches.  You may do salt water and baking soda gargles every 4 hours as needed for your throat pain.  Please put 1 teaspoon of salt and 1/2 teaspoon of baking soda in 8 ounces of warm water then gargle and spit the water out. You may also put 1 tablespoon of honey in warm water and drink this to soothe your throat.  Place a humidifier in your room at night to help decrease dry air that can irritate your airway and cause you to have a sore throat and cough.  Please try to eat a well-balanced diet while you are sick so that your body gets proper nutrition to heal.  If you develop any new or worsening symptoms, please return.  If your symptoms are severe, please go to the emergency room.  Follow-up with your primary care provider for further evaluation and management of your symptoms as well as ongoing wellness visits.  I hope you feel better!

## 2022-05-07 NOTE — ED Triage Notes (Addendum)
Pt states cough started a couple days ago and today she started with nasal congestion that is green. No fever. She did take some nyquil and tylenol last night.   Pt states she took a COVID test at home and it was positive.

## 2022-05-11 ENCOUNTER — Ambulatory Visit: Payer: Medicare Other

## 2022-05-19 ENCOUNTER — Emergency Department (HOSPITAL_BASED_OUTPATIENT_CLINIC_OR_DEPARTMENT_OTHER)
Admission: EM | Admit: 2022-05-19 | Discharge: 2022-05-19 | Disposition: A | Discharge status: Home / Self Care | Payer: Medicare Other | Attending: Emergency Medicine | Admitting: Emergency Medicine

## 2022-05-19 ENCOUNTER — Other Ambulatory Visit: Payer: Self-pay

## 2022-05-19 ENCOUNTER — Emergency Department (HOSPITAL_BASED_OUTPATIENT_CLINIC_OR_DEPARTMENT_OTHER): Payer: Medicare Other

## 2022-05-19 DIAGNOSIS — R109 Unspecified abdominal pain: Secondary | ICD-10-CM | POA: Diagnosis present

## 2022-05-19 DIAGNOSIS — K5900 Constipation, unspecified: Secondary | ICD-10-CM | POA: Insufficient documentation

## 2022-05-19 DIAGNOSIS — R1030 Lower abdominal pain, unspecified: Secondary | ICD-10-CM | POA: Diagnosis not present

## 2022-05-19 LAB — CBC
HCT: 45.6 % (ref 36.0–46.0)
Hemoglobin: 15.4 g/dL — ABNORMAL HIGH (ref 12.0–15.0)
MCH: 28.4 pg (ref 26.0–34.0)
MCHC: 33.8 g/dL (ref 30.0–36.0)
MCV: 84 fL (ref 80.0–100.0)
Platelets: 342 10*3/uL (ref 150–400)
RBC: 5.43 MIL/uL — ABNORMAL HIGH (ref 3.87–5.11)
RDW: 12.4 % (ref 11.5–15.5)
WBC: 13.9 10*3/uL — ABNORMAL HIGH (ref 4.0–10.5)
nRBC: 0 % (ref 0.0–0.2)

## 2022-05-19 LAB — COMPREHENSIVE METABOLIC PANEL
ALT: 25 U/L (ref 0–44)
AST: 18 U/L (ref 15–41)
Albumin: 4.3 g/dL (ref 3.5–5.0)
Alkaline Phosphatase: 61 U/L (ref 38–126)
Anion gap: 11 (ref 5–15)
BUN: 29 mg/dL — ABNORMAL HIGH (ref 8–23)
CO2: 26 mmol/L (ref 22–32)
Calcium: 9.2 mg/dL (ref 8.9–10.3)
Chloride: 99 mmol/L (ref 98–111)
Creatinine, Ser: 1.05 mg/dL — ABNORMAL HIGH (ref 0.44–1.00)
GFR, Estimated: 54 mL/min — ABNORMAL LOW (ref >60)
Glucose, Bld: 110 mg/dL — ABNORMAL HIGH (ref 70–99)
Potassium: 3.2 mmol/L — ABNORMAL LOW (ref 3.5–5.1)
Sodium: 136 mmol/L (ref 135–145)
Total Bilirubin: 1 mg/dL (ref 0.3–1.2)
Total Protein: 7.2 g/dL (ref 6.5–8.1)

## 2022-05-19 LAB — LIPASE, BLOOD: Lipase: 30 U/L (ref 11–51)

## 2022-05-19 MED ORDER — ALUM & MAG HYDROXIDE-SIMETH 200-200-20 MG/5ML PO SUSP
30.0000 mL | Freq: Once | ORAL | Status: DC
  Filled 2022-05-19: qty 30

## 2022-05-19 MED ORDER — FAMOTIDINE 20 MG PO TABS
20.0000 mg | ORAL_TABLET | Freq: Once | ORAL | Status: AC
  Administered 2022-05-19: 20 mg via ORAL
  Filled 2022-05-19: qty 1

## 2022-05-19 MED ORDER — POTASSIUM CHLORIDE CRYS ER 20 MEQ PO TBCR
40.0000 meq | EXTENDED_RELEASE_TABLET | Freq: Once | ORAL | Status: AC
  Administered 2022-05-19: 40 meq via ORAL
  Filled 2022-05-19: qty 2

## 2022-05-19 MED ORDER — IOHEXOL 300 MG/ML  SOLN
100.0000 mL | Freq: Once | INTRAMUSCULAR | Status: AC | PRN
  Administered 2022-05-19: 60 mL via INTRAVENOUS

## 2022-05-19 MED ORDER — FENTANYL CITRATE PF 50 MCG/ML IJ SOSY
50.0000 ug | PREFILLED_SYRINGE | Freq: Once | INTRAMUSCULAR | Status: AC
  Administered 2022-05-19: 50 ug via INTRAVENOUS
  Filled 2022-05-19: qty 1

## 2022-05-19 NOTE — ED Provider Notes (Signed)
[facility] DEPT Provider Note   CSN: [id number] Arrival date & time: 05/19/22  1017     History  Chief Complaint  Patient presents with   Abdominal Pain   Constipation    Elizabeth Walker is a 76 y.o. female.  Pt is a 76 yo female presenting for abdominal pain. Pt admits to constipation that started Friday, approx 5 days ago. States she has had several episodes of straining with attempts to have a bowel movement. Pt has tried mag citrate, dulcolax x 4, enema, and attempt at manual disimpaction on Monday that resulted in a large bowel movement of liquid only. Pt states she had similar episode on Tuesday with blood streaking on tissue paper while wiping. Tried colax and prune juice yesterday. Pt states now she is having nausea and vomiting.   The history is provided by the patient. No language interpreter was used.  Abdominal Pain Associated symptoms: constipation   Associated symptoms: no chest pain, no chills, no cough, no dysuria, no fever, no hematuria, no shortness of breath, no sore throat and no vomiting   Constipation Associated symptoms: abdominal pain   Associated symptoms: no back pain, no dysuria, no fever and no vomiting        Home Medications Prior to Admission medications   Medication Sig Start Date End Date Taking? Authorizing Provider  acetaminophen (TYLENOL) 500 MG tablet Take 500 mg by mouth every 6 (six) hours as needed (for pain.).     [provider]  atorvastatin (LIPITOR) 40 MG tablet TAKE ONE TABLET BY MOUTH ONE TIME DAILY  Patient taking differently: Take 40 mg by mouth daily. 04/09/14   Elayne Snare, MD  chlorthalidone (HYGROTON) 25 MG tablet Take 0.5 tablets (12.5 mg total) by mouth daily. 02/24/22   Lorretta Harp, MD  denosumab (PROLIA) 60 MG/ML SOSY injection Inject 60 mg into the skin every 6 (six) months.    [provider]  ergocalciferol (VITAMIN D2) 50000 UNITS capsule Take 50,000 Units by mouth every  Saturday.    [provider]  fluticasone (FLONASE) 50 MCG/ACT nasal spray Place 1-2 sprays into both nostrils daily as needed for allergies or rhinitis.    [provider]  irbesartan (AVAPRO) 150 MG tablet Take 1 tablet (150 mg total) by mouth every morning. 02/24/22   Lorretta Harp, MD  levothyroxine (SYNTHROID, LEVOTHROID) 100 MCG tablet TAKE 1 TABLET BY MOUTH ONCE DAILY Patient taking differently: Take 100 mcg by mouth daily before breakfast. 07/15/17   Elayne Snare, MD  LORazepam (ATIVAN) 0.5 MG tablet For insomnia. 01/25/22   Dohmeier, Asencion Partridge, MD  meclizine (ANTIVERT) 25 MG tablet Take 1 tablet (25 mg total) by mouth 3 (three) times daily as needed for dizziness. 06/03/18   Isla Pence, MD  omeprazole (PRILOSEC) 20 MG capsule Take 20 mg by mouth daily before breakfast.    [provider]  ondansetron (ZOFRAN-ODT) 4 MG disintegrating tablet Take 1 tablet (4 mg total) by mouth every 8 (eight) hours as needed for nausea or vomiting. 12/16/21   Barrett Henle, MD  Probiotic Product (ACIDOPHILUS/GOAT MILK) CAPS Take by mouth.    [provider]  promethazine-dextromethorphan (PROMETHAZINE-DM) 6.25-15 MG/5ML syrup Take 2.5 mLs by mouth at bedtime as needed for cough. 05/07/22   Talbot Grumbling, FNP      Allergies    Pollen extract and Codeine    Review of Systems   Review of Systems  Constitutional:  Negative for chills and fever.  HENT:  Negative for ear pain and sore throat.   Eyes:  Negative for pain and visual disturbance.  Respiratory:  Negative for cough and shortness of breath.   Cardiovascular:  Negative for chest pain and palpitations.  Gastrointestinal:  Positive for abdominal pain and constipation. Negative for vomiting.  Genitourinary:  Negative for dysuria and hematuria.  Musculoskeletal:  Negative for arthralgias and back pain.  Skin:  Negative for color change and rash.  Neurological:  Negative for seizures and syncope.  All  other systems reviewed and are negative.   Physical Exam Updated Vital Signs BP (!) 134/56 (BP Location: Right Arm)   Pulse 63   Temp 97.8 F (36.6 C) (Oral)   Resp 18   SpO2 100%  Physical Exam Vitals and nursing note reviewed.  Constitutional:      General: She is not in acute distress.    Appearance: She is well-developed.  HENT:     Head: Normocephalic and atraumatic.  Eyes:     Conjunctiva/sclera: Conjunctivae normal.  Cardiovascular:     Rate and Rhythm: Normal rate and regular rhythm.     Heart sounds: No murmur heard. Pulmonary:     Effort: Pulmonary effort is normal. No respiratory distress.     Breath sounds: Normal breath sounds.  Abdominal:     Palpations: Abdomen is soft.     Tenderness: There is no abdominal tenderness.  Musculoskeletal:        General: No swelling.     Cervical back: Neck supple.  Skin:    General: Skin is warm and dry.     Capillary Refill: Capillary refill takes less than 2 seconds.  Neurological:     Mental Status: She is alert.  Psychiatric:        Mood and Affect: Mood normal.     ED Results / Procedures / Treatments   Labs (all labs ordered are listed, but only abnormal results are displayed) Labs Reviewed  COMPREHENSIVE METABOLIC PANEL - Abnormal; Notable for the following components:      Result Value   Potassium 3.2 (*)    Glucose, Bld 110 (*)    BUN 29 (*)    Creatinine, Ser 1.05 (*)    GFR, Estimated 54 (*)    All other components within normal limits  CBC - Abnormal; Notable for the following components:   WBC 13.9 (*)    RBC 5.43 (*)    Hemoglobin 15.4 (*)    All other components within normal limits  LIPASE, BLOOD    EKG None  Radiology No results found.  Procedures Procedures    Medications Ordered in ED Medications  iohexol (OMNIPAQUE) 300 MG/ML solution 100 mL (60 mLs Intravenous Contrast Given 05/19/22 1314)  potassium chloride SA (KLOR-CON M) CR tablet 40 mEq (40 mEq Oral Given 05/19/22 1510)   fentaNYL (SUBLIMAZE) injection 50 mcg (50 mcg Intravenous Given 05/19/22 1510)  famotidine (PEPCID) tablet 20 mg (20 mg Oral Given 05/19/22 1510)    ED Course/ Medical Decision Making/ A&P                           Medical Decision Making Amount and/or Complexity of Data Reviewed Labs: ordered.  Risk Prescription drug management.   76 yo female presenting for abdominal pain with associated intermittent constipation with minimal relief from OTC home treatments. Pt is Aox3, no acute distress, afebrile, with stable vitals. Physical exam demonstrates soft nontender abdomen. Differential dx includes  but is not limited to constipation, colitis, bowel obstruction, etc.   Laboratory studies as interpreted by myself demonstrates stable liver profile, lipase, and renal function. CT abd/pelvis with IV contrast demonstrates normal stool burden. No severe constipation. No fecal impaction. No bowel obstructions. No signs of infection. Rectal exam demonstrates no acute process. No stool noted in rectal vault. Pt safe for DC home with home regimen recommended to help improve symptoms.   Patient in no distress and overall condition improved here in the ED. Detailed discussions were had with the patient regarding current findings, and need for close f/u with PCP or on call doctor. The patient has been instructed to return immediately if the symptoms worsen in any way for re-evaluation. Patient verbalized understanding and is in agreement with current care plan. All questions answered prior to discharge.         Final Clinical Impression(s) / ED Diagnoses Final diagnoses:  Lower abdominal pain    Rx / DC Orders ED Discharge Orders     None         Lianne Cure, DO 33/29/51 [id number]

## 2022-05-19 NOTE — ED Notes (Signed)
Pt given discharge instructions. Opportunities given for questions. Pt verbalizes understanding. Naythen Heikkila R, RN 

## 2022-05-19 NOTE — Discharge Instructions (Signed)
Please call and establish out patient appointment with your GI specialist for lower abdominal pain, intermittent diarrhea, and constipation.

## 2022-06-10 ENCOUNTER — Telehealth: Payer: Self-pay | Admitting: Cardiovascular Disease

## 2022-06-10 MED ORDER — CHLORTHALIDONE 25 MG PO TABS: 12.5000 mg | ORAL_TABLET | Freq: Every day | ORAL | 3 refills | Status: DC

## 2022-06-10 NOTE — Telephone Encounter (Signed)
*  STAT* If patient is at the pharmacy, call can be transferred to refill team.   1. Which medications need to be refilled? (please list name of each medication and dose if known) chlorthalidone (HYGROTON) 25 MG tablet   2. Which pharmacy/location (including street and city if local pharmacy) is medication to be sent to?  [place], [place]    3. Do they need a 30 day or 90 day supply? [place]

## 2022-06-16 ENCOUNTER — Ambulatory Visit: Payer: Medicare Other | Attending: Cardiovascular Disease | Admitting: Student

## 2022-06-16 VITALS — BP 118/75 | HR 69

## 2022-06-16 DIAGNOSIS — I10 Essential (primary) hypertension: Secondary | ICD-10-CM | POA: Diagnosis not present

## 2022-06-16 MED ORDER — CHLORTHALIDONE 25 MG PO TABS: 25.0000 mg | ORAL_TABLET | Freq: Every day | ORAL | 11 refills | Status: DC

## 2022-06-16 NOTE — Progress Notes (Signed)
Patient ID: Elizabeth Walker                 DOB: [birth year]                      MRN: [id number]    HPI: Elizabeth Walker is a 77 y.o. female referred by Dr. Gwenlyn Found to HTN clinic. PMH is significant for vertigo, hypertension, hypothyroidism, HDL, OSA   Today patient forgot to bring home cuff for validation. She has been taking chlorthalidone 25 mg daily and tolerating it well. Denies any SOB, palpitation or swelling. Patient has been dizzy lately due to vertigo. She will be going to PCP end of this month for annual wellness visit will discuss the issue further with PCP. Her home BP ~ 132/65 with heart rate 55.  Current HTN meds: irbesartan 150 mg daily chlorthalidone 12.5 mg daily BP goal: 130/80  Social History:  Smoking: none  EtOh: rare Caffeine: 1-2   Diet:  generally healthy low salt low fat and low carb   Exercise: walking 3- 4 times per week for 1 mile   Home BP readings:  Date SBP/DBP  HR   131/69 53   130/53 54   133/70 57  Average 132/65 55    Wt Readings from Last 3 Encounters:  02/24/22 104 lb 12.8 oz (47.5 kg)  05/04/21 103 lb (46.7 kg)  12/31/20 110 lb 6.4 oz (50.1 kg)   BP Readings from Last 3 Encounters:  06/16/22 118/75  05/19/22 (!) 134/56  05/07/22 (!) 157/75   Pulse Readings from Last 3 Encounters:  06/16/22 69  05/19/22 63  05/07/22 (!) 57    Renal function: CrCl cannot be calculated (Patient's most recent lab result is older than the maximum 21 days allowed.).  Past Medical History:  Diagnosis Date   Anxiety    Arthritis    Depression    Esophageal spasm    a. pt reports prior GI study demonstrating this.   GERD (gastroesophageal reflux disease)    Hypercholesteremia    Hypertension    Hypothyroid    Migraine    Osteoporosis    Pneumonia    PONV (postoperative nausea and vomiting)    Sinusitis    Sleep apnea    a. intolerant to CPAP.   Tachycardia    a. suspected due to sinus tach. Event monitor/echo unremarkable.    Current  Outpatient Medications on File Prior to Visit  Medication Sig Dispense Refill   acetaminophen (TYLENOL) 500 MG tablet Take 500 mg by mouth every 6 (six) hours as needed (for pain.).      atorvastatin (LIPITOR) 40 MG tablet TAKE ONE TABLET BY MOUTH ONE TIME DAILY  (Patient taking differently: Take 40 mg by mouth daily.) 90 tablet 0   denosumab (PROLIA) 60 MG/ML SOSY injection Inject 60 mg into the skin every 6 (six) months.     ergocalciferol (VITAMIN D2) 50000 UNITS capsule Take 50,000 Units by mouth every Saturday.     fluticasone (FLONASE) 50 MCG/ACT nasal spray Place 1-2 sprays into both nostrils daily as needed for allergies or rhinitis.     irbesartan (AVAPRO) 150 MG tablet Take 1 tablet (150 mg total) by mouth every morning. 90 tablet 3   levothyroxine (SYNTHROID, LEVOTHROID) 100 MCG tablet TAKE 1 TABLET BY MOUTH ONCE DAILY (Patient taking differently: Take 100 mcg by mouth daily before breakfast.) 30 tablet 0   LORazepam (ATIVAN) 0.5 MG tablet For insomnia. 30 tablet 1  meclizine (ANTIVERT) 25 MG tablet Take 1 tablet (25 mg total) by mouth 3 (three) times daily as needed for dizziness. 30 tablet 0   omeprazole (PRILOSEC) 20 MG capsule Take 20 mg by mouth daily before breakfast.     ondansetron (ZOFRAN-ODT) 4 MG disintegrating tablet Take 1 tablet (4 mg total) by mouth every 8 (eight) hours as needed for nausea or vomiting. 10 tablet 0   Probiotic Product (ACIDOPHILUS/GOAT MILK) CAPS Take by mouth.     promethazine-dextromethorphan (PROMETHAZINE-DM) 6.25-15 MG/5ML syrup Take 2.5 mLs by mouth at bedtime as needed for cough. 118 mL 0   Current Facility-Administered Medications on File Prior to Visit  Medication Dose Route Frequency Provider Last Rate Last Admin   lidocaine-EPINEPHrine (XYLOCAINE W/EPI) 1 %-1:100000 (with pres) injection 10 mL  10 mL Infiltration Once Croitoru, Mihai, MD        Allergies  Allergen Reactions   Pollen Extract Other (See Comments)    Stuffy nose   Codeine  Nausea And Vomiting    Blood pressure 118/75, pulse 69, SpO2 100 %.   Essential hypertension Assessment: BP is controlled in office BP 118/75 mmHg  with heart rate 69 (goal (<130/80) home BP ~ 132/65 with heart rate 55 Feels dizzy due to vertigo - may restart meclizine under PCP guidance  Tolerates irbesartan 150 mg daily chlorthalidone 12.5 mg daily well without any side effects Denies SOB, palpitation, chest pain, headaches,or swelling Forgot to bring home BP cuff for validation  Reiterated importance of regular exercise and low salt diet   Plan:  Continue taking rbesartan 150 mg daily chlorthalidone 12.5 mg daily Patient to keep record of BP readings with heart rate and report to [name] at the next visit Patient to see PharmD in 4 weeks for follow up just to validate home BP monitor   Thank you  Cammy Copa, Pharm.D [facility] A Division of [facility] [address], [place], [place] [address]  Phone: [phone]; Fax: [phone]

## 2022-06-16 NOTE — Assessment & Plan Note (Addendum)
Assessment: BP is controlled in office BP 118/75 mmHg  with heart rate 69 (goal (<130/80) home BP ~ 132/65 with heart rate 55 Feels dizzy due to vertigo - may restart meclizine under PCP guidance  Tolerates irbesartan 150 mg daily chlorthalidone 12.5 mg daily well without any side effects Denies SOB, palpitation, chest pain, headaches,or swelling Forgot to bring home BP cuff for validation  Reiterated importance of regular exercise and low salt diet   Plan:  Continue taking rbesartan 150 mg daily chlorthalidone 12.5 mg daily Patient to keep record of BP readings with heart rate and report to [name] at the next visit Patient to see PharmD in 4 weeks for follow up just to validate home BP monitor

## 2022-06-18 ENCOUNTER — Other Ambulatory Visit: Payer: Self-pay | Admitting: Internal Medicine

## 2022-06-18 ENCOUNTER — Encounter: Payer: Self-pay | Admitting: Internal Medicine

## 2022-06-18 DIAGNOSIS — Z1231 Encounter for screening mammogram for malignant neoplasm of breast: Secondary | ICD-10-CM

## 2022-06-25 ENCOUNTER — Ambulatory Visit
Admission: RE | Admit: 2022-06-25 | Discharge: 2022-06-25 | Disposition: A | Discharge status: Home / Self Care | Payer: Medicare Other | Source: Ambulatory Visit | Attending: Internal Medicine | Admitting: Internal Medicine

## 2022-06-25 DIAGNOSIS — Z1231 Encounter for screening mammogram for malignant neoplasm of breast: Secondary | ICD-10-CM

## 2022-07-13 ENCOUNTER — Ambulatory Visit: Payer: Medicare Other | Admitting: Family Medicine

## 2022-07-13 ENCOUNTER — Other Ambulatory Visit: Payer: Self-pay | Admitting: Internal Medicine

## 2022-07-13 ENCOUNTER — Ambulatory Visit: Payer: Medicare Other | Admitting: Neurology

## 2022-07-13 VITALS — Ht 60.0 in | Wt 111.0 lb

## 2022-07-13 DIAGNOSIS — F5104 Psychophysiologic insomnia: Secondary | ICD-10-CM | POA: Diagnosis not present

## 2022-07-13 DIAGNOSIS — Z9682 Presence of neurostimulator: Secondary | ICD-10-CM

## 2022-07-13 DIAGNOSIS — R0602 Shortness of breath: Secondary | ICD-10-CM

## 2022-07-13 NOTE — Progress Notes (Signed)
    Dear Elta Guadeloupe ,   Mrs. Hugh Garrow. Pippenger is seen here today and I requested work-in visit.  She is a 77 year old female mutual patient with Dr. Crist Infante and Hyman Hopes. She received an inspire implant through Dr. Jerrell Belfast and has followed up with Dr. Wilburn Cornelia the last time in Oct 12, 2021.   Her surgeon is meanwhile retired.   Dr Joylene Draft has seen her for pulmonary problems, after she had contracted COVID three times, the last time in November 2023. She feels she had not recovered from the July 2023 bout.  She remains struggling with chronic insomnia.  She reports that some nights she does not use the inspire device because she does not initiate sleep.  She was concerned after she was told manage chest x-ray was obtained that the paperwork to her hypoglossal nerve stimulator seems to be not in the expected place.  However, she is one of the patient's that still underwent approach for implantation and the stretch sensor that was placed between the ribs was placed lower than it currently would be.    The Chest X ray laterally was reviewed and looks normal , the wire is attached and loops once around the hypoglossal nerve stimulator which is intended.  Recheck today her waveform which was normal it shows the stimulation and respiratory response of the patient to be regular.  The waveform was also captured in a print out.     IN FACIT :  There is no concern on our side about the functionality of the device.  The patient had been referred for cognitive behavioral therapy to treat her insomnia.  Her apnea should be well-controlled on the current settings.    [name], MD    PS : INSOMNIA.  A psychological approach to primary Insomnia is preferable to medication.  I know we have discussed the referral many times, and she reported back to me that "everything was normal" . Cognitive behavior therapy allowed her to relax initially but she has not achieved better sleep. She is now  investigating [facility]  in [place].  This is a biofeedback lab and I am all in favor of using it for Anxiety, Insomnia.   [name], MD

## 2022-07-15 ENCOUNTER — Ambulatory Visit: Payer: Medicare Other

## 2022-07-23 ENCOUNTER — Ambulatory Visit
Admission: RE | Admit: 2022-07-23 | Discharge: 2022-07-23 | Disposition: A | Discharge status: Home / Self Care | Payer: Medicare Other | Source: Ambulatory Visit | Attending: Internal Medicine | Admitting: Internal Medicine

## 2022-07-23 DIAGNOSIS — R0602 Shortness of breath: Secondary | ICD-10-CM

## 2022-08-06 ENCOUNTER — Ambulatory Visit: Payer: Medicare Other | Attending: Internal Medicine | Admitting: Student

## 2022-08-06 ENCOUNTER — Encounter: Payer: Self-pay | Admitting: Student

## 2022-08-06 VITALS — BP 110/65 | HR 63

## 2022-08-06 DIAGNOSIS — I10 Essential (primary) hypertension: Secondary | ICD-10-CM

## 2022-08-06 NOTE — Progress Notes (Unsigned)
Patient ID: KIARALIZ TRATHEN                 DOB: 1945-09-27                      MRN: [id number]    HPI: Elizabeth Walker is a 77 y.o. female referred by Dr. Gwenlyn Found to HTN clinic. PMH is significant for vertigo, hypertension, hypothyroidism, HDL, OSA   Today patient presented with home cuff. We validated arm BP cuff in the office today. It is accurate. Patient has been checking BP at home and it is running in 110-120/60-70 with heart rate 65. Patient nearly fainted once end of Jan, reports it is not first time she has this problem since she was in her teen years. There are sudden movement and stressful situations leads to this episodes. EMS was called multiple time there is no specific cause found out. Reports she drinks same amount water everyday and her diet has not changed. Patient's chlorthalidone was lowered to 12.5 mg from 25 mg in 10/23 when she saw Dr.Berry. but patient has been taking full tablet of 25 mg encouraged her to take half tablet as prescribed  Home BP validation  Date SBP/DBP  HR  1st on home monitor  111/71 67  1st on office cuff  114/69 63  2nd on home monitor  107/66 66  2nd on office cuff  132/65 55    Current HTN meds: irbesartan 150 mg daily chlorthalidone 12.5 mg daily BP goal: 130/80   Social History:  Smoking: none  EtOh: rare Caffeine: 1-2   Diet:  generally healthy low salt low fat and low carb   Exercise: walking 3- 4 times per week for 1 mile   Home BP readings: 110-120/60-70 heart rate 65  Wt Readings from Last 3 Encounters:  07/13/22 111 lb (50.3 kg)  02/24/22 104 lb 12.8 oz (47.5 kg)  05/04/21 103 lb (46.7 kg)   BP Readings from Last 3 Encounters:  08/06/22 110/65  06/16/22 118/75  05/19/22 (!) 134/56   Pulse Readings from Last 3 Encounters:  08/06/22 63  06/16/22 69  05/19/22 63    Renal function: CrCl cannot be calculated (Patient's most recent lab result is older than the maximum 21 days allowed.).  Past Medical History:  Diagnosis  Date   Anxiety    Arthritis    Depression    Esophageal spasm    a. pt reports prior GI study demonstrating this.   GERD (gastroesophageal reflux disease)    Hypercholesteremia    Hypertension    Hypothyroid    Migraine    Osteoporosis    Pneumonia    PONV (postoperative nausea and vomiting)    Sinusitis    Sleep apnea    a. intolerant to CPAP.   Tachycardia    a. suspected due to sinus tach. Event monitor/echo unremarkable.    Current Outpatient Medications on File Prior to Visit  Medication Sig Dispense Refill   acetaminophen (TYLENOL) 500 MG tablet Take 500 mg by mouth every 6 (six) hours as needed (for pain.).      atorvastatin (LIPITOR) 40 MG tablet TAKE ONE TABLET BY MOUTH ONE TIME DAILY  (Patient taking differently: Take 40 mg by mouth daily.) 90 tablet 0   chlorthalidone (HYGROTON) 25 MG tablet Take 1 tablet (25 mg total) by mouth daily. 30 tablet 11   denosumab (PROLIA) 60 MG/ML SOSY injection Inject 60 mg into the skin every 6 (six)  months.     ergocalciferol (VITAMIN D2) 50000 UNITS capsule Take 50,000 Units by mouth every Saturday.     fluticasone (FLONASE) 50 MCG/ACT nasal spray Place 1-2 sprays into both nostrils daily as needed for allergies or rhinitis.     irbesartan (AVAPRO) 150 MG tablet Take 1 tablet (150 mg total) by mouth every morning. 90 tablet 3   levothyroxine (SYNTHROID, LEVOTHROID) 100 MCG tablet TAKE 1 TABLET BY MOUTH ONCE DAILY (Patient taking differently: Take 100 mcg by mouth daily before breakfast.) 30 tablet 0   LORazepam (ATIVAN) 0.5 MG tablet For insomnia. 30 tablet 1   meclizine (ANTIVERT) 25 MG tablet Take 1 tablet (25 mg total) by mouth 3 (three) times daily as needed for dizziness. 30 tablet 0   omeprazole (PRILOSEC) 20 MG capsule Take 20 mg by mouth daily before breakfast.     ondansetron (ZOFRAN-ODT) 4 MG disintegrating tablet Take 1 tablet (4 mg total) by mouth every 8 (eight) hours as needed for nausea or vomiting. 10 tablet 0   Probiotic  Product (ACIDOPHILUS/GOAT MILK) CAPS Take by mouth.     promethazine-dextromethorphan (PROMETHAZINE-DM) 6.25-15 MG/5ML syrup Take 2.5 mLs by mouth at bedtime as needed for cough. 118 mL 0   Current Facility-Administered Medications on File Prior to Visit  Medication Dose Route Frequency Provider Last Rate Last Admin   lidocaine-EPINEPHrine (XYLOCAINE W/EPI) 1 %-1:100000 (with pres) injection 10 mL  10 mL Infiltration Once Croitoru, Mihai, MD        Allergies  Allergen Reactions   Pollen Extract Other (See Comments)    Stuffy nose   Codeine Nausea And Vomiting    Blood pressure 110/65, pulse 63, SpO2 98 %.   Essential hypertension Assessment: BP is controlled in office BP 118/75 mmHg  with heart rate 69 (goal (<130/80) home BP ~ 110-120/60-70 with heart rate 65 Tolerates irbesartan 150 mg daily chlorthalidone 25 mg daily well without any side effects  Had one episode of near fainting which is not new have had that problem since she was teen and can't find the reason for that  Denies SOB, palpitation, chest pain, headaches,or swelling By mistake patient has been taking full tablet of chlorthalidone 25 mg instead of 1/2   Plan:  Continue taking rbesartan 150 mg daily  Start taking chlorthalidone 1/2 tablet of 25 mg daily Patient to keep record of BP readings with heart rate and report to [name] if it persistently staying above goal (<130/80) 6 months follow up with Dr. Gwenlyn Found in March 2024   Thank you  Cammy Copa, Pharm.D [facility] Division of [facility] [address], [place], [place] [address]  Phone: [phone]; Fax: [phone]

## 2022-08-06 NOTE — Assessment & Plan Note (Signed)
Assessment: BP is controlled in office BP 118/75 mmHg  with heart rate 69 (goal (<130/80) home BP ~ 110-120/60-70 with heart rate 65 Tolerates irbesartan 150 mg daily chlorthalidone 25 mg daily well without any side effects  Had one episode of near fainting which is not new have had that problem since she was teen and can't find the reason for that  Denies SOB, palpitation, chest pain, headaches,or swelling By mistake patient has been taking full tablet of chlorthalidone 25 mg instead of 1/2   Plan:  Continue taking rbesartan 150 mg daily  Start taking chlorthalidone 1/2 tablet of 25 mg daily Patient to keep record of BP readings with heart rate and report to [name] if it persistently staying above goal (<130/80) 6 months follow up with Dr. Gwenlyn Found in March 2024

## 2022-08-09 ENCOUNTER — Ambulatory Visit: Payer: Medicare Other

## 2022-08-27 ENCOUNTER — Ambulatory Visit: Payer: Medicare Other | Attending: Cardiovascular Disease | Admitting: Cardiovascular Disease

## 2022-08-27 ENCOUNTER — Encounter: Payer: Self-pay | Admitting: Cardiovascular Disease

## 2022-08-27 VITALS — BP 125/60 | Ht 60.0 in | Wt 112.0 lb

## 2022-08-27 DIAGNOSIS — R0789 Other chest pain: Secondary | ICD-10-CM | POA: Diagnosis not present

## 2022-08-27 DIAGNOSIS — E785 Hyperlipidemia, unspecified: Secondary | ICD-10-CM

## 2022-08-27 DIAGNOSIS — I10 Essential (primary) hypertension: Secondary | ICD-10-CM | POA: Diagnosis not present

## 2022-08-27 DIAGNOSIS — R001 Bradycardia, unspecified: Secondary | ICD-10-CM | POA: Diagnosis not present

## 2022-08-27 NOTE — Assessment & Plan Note (Signed)
History of essential hypertension blood pressure measured today at 125/60.  She is on chlorthalidone and Avapro.

## 2022-08-27 NOTE — Assessment & Plan Note (Signed)
History of atypical chest pain in the past with coronary calcium score performed 05/01/2019 revealing a coronary calcium of 0.3 with a single speck of calcium in the LAD.  She no longer has chest pain but

## 2022-08-27 NOTE — Patient Instructions (Signed)
Medication Instructions:  Your physician recommends that you continue on your current medications as directed. Please refer to the Current Medication list given to you today.  *If you need a refill on your cardiac medications before your next appointment, please call your pharmacy*   Follow-Up: At [facility], you and your health needs are our priority.  As part of our continuing mission to provide you with exceptional heart care, we have created designated Provider Care Teams.  These Care Teams include your primary Cardiologist (physician) and Advanced Practice Providers (APPs -  Physician Assistants and Nurse Practitioners) who all work together to provide you with the care you need, when you need it.  We recommend signing up for the patient portal called "MyChart".  Sign up information is provided on this After Visit Summary.  MyChart is used to connect with patients for Virtual Visits (Telemedicine).  Patients are able to view lab/test results, encounter notes, upcoming appointments, etc.  Non-urgent messages can be sent to your provider as well.   To learn more about what you can do with MyChart, go to [email or web address].    Your next appointment:   12 month(s)  Provider:   Jonathan Berry, MD    

## 2022-08-27 NOTE — Assessment & Plan Note (Signed)
History of hyperlipidemia on statin therapy with lipid profile performed 07/05/2022 revealing total cholesterol 161, LDL of 93 and HDL of 51.

## 2022-08-27 NOTE — Assessment & Plan Note (Signed)
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

## 2022-08-27 NOTE — Progress Notes (Signed)
08/27/2022 Elizabeth Walker   [birth year]  [id number]  Primary Physician Crist Infante, MD Primary Cardiologist: Lorretta Harp MD Lupe Carney, [place]  HPI:  Elizabeth Walker is a 77 y.o.   thin and fit appearing married Caucasian female mother of 2, grandmother of 7 grandchildren who worked last at [employer].   She was referred by her PCP, Dr. Joylene Draft for cardiovascular valuation because of bradycardia.  She has seen Dr. Rayann Heman in the past 08/20/2011 for palpitations.    I last saw her in the office 02/24/2022.  Her cardiac risk factor profile is notable for treated hypertension hyperlipidemia.  Her father did die of a myocardial infarction at age 6 and her mother had bypass surgery and valve replacement.  History of bypass surgery as well.  She had a negative Myoview stress test back in 2015 and a recent coronary calcium score of 0.  She does complain of some shortness of breath but denies chest pain.  She has been on multiple antihypertensive medications but is intolerant to calcium channel blockers causing peripheral edema.  She has been on hydralazine as well.  Currently she is on Bystolic, Avapro and chlorthalidone.  Her heart rate runs in the 40s.  She complains of chronic fatigue.  She also has episodes of presyncope.   A 2D echo was performed that was essentially normal.  Event monitor was also placed that revealed episodes of sinus bradycardia.  Based on this I did decrease her Bystolic from 5 to 2.5 mg a day which has resulted in an increase in her heart rate up in the 60 range.  She continues to have episodic presyncope although this did not occur while she was wearing her 30-day event monitor.  She also complains of some dyspnea on exertion.  She has had atypical chest pain in the past and had a coronary calcium score performed 05/01/2019 which was 0.3.  She had a loop recorder implanted by Dr. Sallyanne Kuster 10/24/2019 which has not shown any arrhythmias.  Episodes of dizziness have  resolved.  She no longer has atypical chest pain either.   Since I saw her in the office 6 months ago she is remained stable.  Blood pressure is in normal range.  She denies chest pain or shortness of breath.  She did contract COVID in July and November of last year.   Current Meds  Medication Sig   acetaminophen (TYLENOL) 500 MG tablet Take 500 mg by mouth every 6 (six) hours as needed (for pain.).    atorvastatin (LIPITOR) 40 MG tablet TAKE ONE TABLET BY MOUTH ONE TIME DAILY    chlorthalidone (HYGROTON) 25 MG tablet Take 1 tablet (25 mg total) by mouth daily. (Patient taking differently: Take 12.5 mg by mouth daily.)   denosumab (PROLIA) 60 MG/ML SOSY injection Inject 60 mg into the skin every 6 (six) months.   ergocalciferol (VITAMIN D2) 50000 UNITS capsule Take 50,000 Units by mouth every Saturday.   fluticasone (FLONASE) 50 MCG/ACT nasal spray Place 1-2 sprays into both nostrils daily as needed for allergies or rhinitis.   irbesartan (AVAPRO) 150 MG tablet Take 1 tablet (150 mg total) by mouth every morning.   levothyroxine (SYNTHROID, LEVOTHROID) 100 MCG tablet TAKE 1 TABLET BY MOUTH ONCE DAILY   LORazepam (ATIVAN) 0.5 MG tablet For insomnia.   meclizine (ANTIVERT) 25 MG tablet Take 1 tablet (25 mg total) by mouth 3 (three) times daily as needed for dizziness.   omeprazole (  PRILOSEC) 20 MG capsule Take 20 mg by mouth daily before breakfast.   ondansetron (ZOFRAN-ODT) 4 MG disintegrating tablet Take 1 tablet (4 mg total) by mouth every 8 (eight) hours as needed for nausea or vomiting.   Probiotic Product (ACIDOPHILUS/GOAT MILK) CAPS Take by mouth.   Current Facility-Administered Medications for the 08/27/22 encounter (Office Visit) with Lorretta Harp, MD  Medication   lidocaine-EPINEPHrine (XYLOCAINE W/EPI) 1 %-1:100000 (with pres) injection 10 mL     Allergies  Allergen Reactions   Pollen Extract Other (See Comments)    Stuffy nose   Codeine Nausea And Vomiting    Social  History   Socioeconomic History   Marital status: Married    Spouse name: Not on file   Number of children: Not on file   Years of education: Not on file   Highest education level: Not on file  Occupational History   Not on file  Tobacco Use   Smoking status: Never   Smokeless tobacco: Never  Vaping Use   Vaping Use: Never used  Substance and Sexual Activity   Alcohol use: Yes    Comment: rare wine   Drug use: No   Sexual activity: Not on file  Other Topics Concern   Not on file  Social History Narrative   Pt lives in [place] with spouse.  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   Social Determinants of Health   Financial Resource Strain: Not on file  Food Insecurity: Not on file  Transportation Needs: Not on file  Physical Activity: Not on file  Stress: Not on file  Social Connections: Not on file  Intimate Partner Violence: Not on file     Review of Systems: General: negative for chills, fever, night sweats or weight changes.  Cardiovascular: negative for chest pain, dyspnea on exertion, edema, orthopnea, palpitations, paroxysmal nocturnal dyspnea or shortness of breath Dermatological: negative for rash Respiratory: negative for cough or wheezing Urologic: negative for hematuria Abdominal: negative for nausea, vomiting, diarrhea, bright Walker blood per rectum, melena, or hematemesis Neurologic: negative for visual changes, syncope, or dizziness All other systems reviewed and are otherwise negative except as noted above.    Blood pressure 125/60, height 5' (1.524 m), weight 112 lb (50.8 kg).  General appearance: alert and no distress Neck: no adenopathy, no carotid bruit, no JVD, supple, symmetrical, trachea midline, and thyroid not enlarged, symmetric, no tenderness/mass/nodules Lungs: clear to auscultation bilaterally Heart: regular rate and rhythm, S1, S2 normal, no murmur, click, rub or gallop Extremities: extremities normal, atraumatic, no cyanosis or  edema Pulses: 2+ and symmetric Skin: Skin color, texture, turgor normal. No rashes or lesions Neurologic: Grossly normal  EKG sinus rhythm at 66 with septal Q waves.  I personally reviewed this EKG.  ASSESSMENT AND PLAN:   HYPERLIPIDEMIA History of hyperlipidemia on statin therapy with lipid profile performed 07/05/2022 revealing total cholesterol 161, LDL of 93 and HDL of 51.  Essential hypertension History of essential hypertension blood pressure measured today at 125/60.  She is on chlorthalidone and Avapro.  Atypical chest pain History of atypical chest pain in the past with coronary calcium score performed 05/01/2019 revealing a coronary calcium of 0.3 with a single speck of calcium in the LAD.  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     Lorretta Harp MD FACP,FACC,FAHA, [facility]  08/27/2022 3:57 PM

## 2022-08-31 ENCOUNTER — Ambulatory Visit: Payer: Medicare Other | Admitting: Allergy & Immunology

## 2022-09-14 ENCOUNTER — Encounter: Payer: Self-pay | Admitting: Allergy & Immunology

## 2022-09-14 ENCOUNTER — Other Ambulatory Visit: Payer: Self-pay

## 2022-09-14 ENCOUNTER — Ambulatory Visit: Payer: Medicare Other | Admitting: Allergy & Immunology

## 2022-09-14 VITALS — BP 120/70 | HR 62 | Temp 98.3°F | Resp 18 | Ht 60.0 in | Wt 110.6 lb

## 2022-09-14 DIAGNOSIS — J310 Chronic rhinitis: Secondary | ICD-10-CM

## 2022-09-14 DIAGNOSIS — B999 Unspecified infectious disease: Secondary | ICD-10-CM | POA: Diagnosis not present

## 2022-09-14 DIAGNOSIS — J4540 Moderate persistent asthma, uncomplicated: Secondary | ICD-10-CM | POA: Diagnosis not present

## 2022-09-14 DIAGNOSIS — M1990 Unspecified osteoarthritis, unspecified site: Secondary | ICD-10-CM | POA: Diagnosis not present

## 2022-09-14 MED ORDER — IPRATROPIUM BROMIDE 0.06 % NA SOLN: 2.0000 | Freq: Three times a day (TID) | NASAL | 2 refills | Status: DC | PRN

## 2022-09-14 MED ORDER — ALBUTEROL SULFATE HFA 108 (90 BASE) MCG/ACT IN AERS: 2.0000 | INHALATION_SPRAY | Freq: Four times a day (QID) | RESPIRATORY_TRACT | 1 refills | Status: DC | PRN

## 2022-09-14 MED ORDER — BREZTRI AEROSPHERE 160-9-4.8 MCG/ACT IN AERO: 2.0000 | INHALATION_SPRAY | Freq: Two times a day (BID) | RESPIRATORY_TRACT | 2 refills | Status: DC

## 2022-09-14 MED ORDER — CARBINOXAMINE MALEATE 4 MG PO TABS: 4.0000 mg | ORAL_TABLET | Freq: Three times a day (TID) | ORAL | 2 refills | Status: DC | PRN

## 2022-09-14 NOTE — Patient Instructions (Addendum)
1. Moderate persistent asthma, uncomplicated - Lung testing looked good today and there was not much improvement with the nebulizer. - We are going to start a Breztri on you (contains three medications to help control your breathing). - Spacer sample and demonstration provided. - Daily controller medication(s): Breztri two puffs twice daily with spacer  - Prior to physical activity: albuterol 2 puffs 10-15 minutes before physical activity. - Rescue medications: albuterol 4 puffs every 4-6 hours as needed - Asthma control goals:  * Full participation in all desired activities (may need albuterol before activity) * Albuterol use two time or less a week on average (not counting use with activity) * Cough interfering with sleep two time or less a month * Oral steroids no more than once a year * No hospitalizations  2. Chronic rhinitis - Testing today showed: indoor molds - Copy of test results provided.  - Avoidance measures provided. - Stop taking: all of your medications (except for the ipratropium) - Continue with: Atrovent (ipratropium) 0.06% one spray per nostril 3-4 times daily as needed (CAN BE OVER DRYING) - Start taking: carbinoxamine 4mg  every 8 hours as needed - You can use an extra dose of the antihistamine, if needed, for breakthrough symptoms.  - Consider nasal saline rinses 1-2 times daily to remove allergens from the nasal cavities as well as help with mucous clearance (this is especially helpful to do before the nasal sprays are given)  4. Recurrent infections - We will obtain some screening labs to evaluate your immune system.  - Labs to evaluate the quantitative [facility]) aspects of your immune system: IgG/IgA/IgM, CBC with differential - Labs to evaluate the qualitative (Smith) aspects of your immune system: CH50, Pneumococcal titers, Tetanus titers, Diphtheria titers - We may consider immunizations with Pneumovax and Tdap to challenge your immune system, and  then obtain repeat titers in 4-6 weeks.   4. Return in about 2 months (around 11/14/2022). You can have the follow up appointment with Dr. Ernst Bowler or a Nurse Practicioner (our Nurse Practitioners are excellent and always have Physician oversight!).    Please inform us of any Emergency Department visits, hospitalizations, or changes in symptoms. Call us before going to the ED for breathing or allergy symptoms since we might be able to fit you in for a sick visit. Feel free to contact us anytime with any questions, problems, or concerns.  It was a pleasure to meet you today!  Websites that have reliable patient information: 1. American Academy of Asthma, Allergy, and Immunology: www.aaaai.org 2. Food Allergy Research and Education (FARE): foodallergy.org 3. Mothers of Asthmatics: http://www.asthmacommunitynetwork.org 4. American College of Allergy, Asthma, and Immunology: www.acaai.org   COVID-19 Vaccine Information can be found at: ShippingScam.co.uk For questions related to vaccine distribution or appointments, please email [email or web address] or call [phone].   We realize that you might be concerned about having an allergic reaction to the COVID19 vaccines. To help with that concern, WE ARE OFFERING THE COVID19 VACCINES IN OUR OFFICE! Ask the front desk for dates!     "Like" [name] on Facebook and Instagram for our latest updates!      A healthy democracy works best when [employer] participate! Make sure you are registered to vote! If you have moved or changed any of your contact information, you will need to get this updated before voting!  In some cases, you MAY be able to register to vote online: [email or web address]       Airborne Adult Perc -  09/14/22 1518     Time Antigen Placed 1518    Location Back    Number of Test 59    Panel 1 Select    1. Control-Buffer 50% Glycerol Negative     2. Control-Histamine 1 mg/ml Negative    3. Albumin saline Negative    4. South End Negative    5. [place] Negative    6. Johnson Negative    7. [place] Blue Negative    8. Meadow Fescue Negative    9. Perennial Rye Negative    10. Sweet Vernal Negative    11. Timothy Negative    12. Cocklebur Negative    13. Burweed Marshelder Negative    14. Ragweed, short Negative    15. Ragweed, Giant Negative    16. Plantain,  English Negative    17. Lamb's Quarters Negative    18. Sheep Sorrell Negative    19. Rough Pigweed Negative    20. Marsh Elder, Rough Negative    21. Mugwort, Common Negative    22. Ash mix Negative    23. Birch mix Negative    24. Beech American Negative    25. Box, Elder Negative    26. Cedar, red Negative    27. Cottonwood, [place] Negative    28. Elm mix Negative    29. Hickory Negative    30. Maple mix Negative    31. Oak, [place] mix Negative    32. Pecan Pollen Negative    33. Pine mix Negative    34. Sycamore Eastern Negative    35. Parker, Black Pollen Negative    36. Alternaria alternata Negative    37. Cladosporium Herbarum Negative    38. Aspergillus mix Negative    39. Penicillium mix Negative    40. Bipolaris sorokiniana (Helminthosporium) Negative    41. Drechslera spicifera (Curvularia) Negative    42. Mucor plumbeus Negative    43. Fusarium moniliforme Negative    44. Aureobasidium pullulans (pullulara) Negative    45. Rhizopus oryzae Negative    46. Botrytis cinera Negative    47. Epicoccum nigrum Negative    48. Phoma betae Negative    49. Candida Albicans Negative    50. Trichophyton mentagrophytes Negative    51. Mite, D Farinae  5,000 AU/ml Negative    52. Mite, D Pteronyssinus  5,000 AU/ml Negative    53. Cat Hair 10,000 BAU/ml Negative    54.  Dog Epithelia Negative    55. Mixed Feathers Negative    56. Horse Epithelia Negative    57. Cockroach, German Negative    58. Mouse Negative    59. Tobacco Leaf Negative              Intradermal - 09/14/22 1627     Time Antigen Placed 1627    Allergen Manufacturer Lavella Hammock    Location Arm    Number of Test 15    Intradermal Select    Control Negative    Guatemala Negative    Johnson Negative    7 Grass Negative    Ragweed mix Negative    Weed mix Negative    Tree mix Negative    Mold 1 Negative    Mold 2 Negative    Mold 3 Negative    Mold 4 3+    Cat Negative    Dog Negative    Cockroach Negative    Mite mix Negative

## 2022-09-14 NOTE — Progress Notes (Unsigned)
NEW PATIENT  Date of Service/Encounter:  09/14/22  Consult requested by: Crist Infante, MD   Assessment:   No diagnosis found.  Plan/Recommendations:    There are no Patient Instructions on file for this visit.   {Blank single:19197::"This note in its entirety was forwarded to the Provider who requested this consultation."}  Subjective:   Elizabeth Walker is a 77 y.o. female presenting today for evaluation of No chief complaint on file.   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   Dizziness 10/03/2019   OSA (obstructive sleep apnea) 08/20/2019   Slow heart rate 05/24/2019   Obstructive sleep apnea 03/09/2019   Chronic insomnia 08/29/2017   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   Excessive daytime sleepiness 08/29/2017   Intolerance of continuous positive airway pressure (CPAP) ventilation 08/29/2017   Sinusitis, chronic 01/02/2015   Cough variant asthma 06/18/2014   Dyspnea on exertion 05/13/2014   Hypercholesteremia    Atypical chest pain 04/08/2014   Asthma with acute exacerbation 04/03/2014   PNA (pneumonia) 04/02/2014   Hypokalemia 04/02/2014   CAP (community acquired pneumonia) 04/02/2014   Hypothyroidism 04/10/2013   Tachycardia 04/22/2011   HYPERLIPIDEMIA 12/04/2007   Essential hypertension 12/04/2007   ALLERGIC RHINITIS 12/04/2007   ASTHMA 12/04/2007   Headache 12/04/2007   Cough 12/04/2007    History obtained from: chart review and patient.  Elizabeth Walker was referred by Crist Infante, MD.     Elizabeth Walker is a 77 y.o. female presenting for an evaluation of brething problems  .    Asthma/Respiratory Symptom History: She has been having shortness of breath. This all started when she had COVID four times in a row.  She has Breo to use as needed. She never noticed good results from it. She has issues with walking up and down stairs. She has to sit down due to exhaustion just with walking up and down the stairs. She has albuterol which does help. Today is a good day. Her spiro looked fairly good. She was diagnosed with ashtma decades ago, even when she was followed by Dr. Donneta Romberg. She had a severe PNA case around 2015 and was hospitalized for seven days.   Allergic Rhinitis Symptom History: She has eye runniness.  She is followed by an ophthalmologist and uses fluorometholone around three times per week. She has a lot of sneezing. She was on allergy shots for a "good number of years" in the early 2000s with Dr. Donneta Romberg. She was likely on them for ten years. She is not sure whether they helped at all. She uses antihistamines regularly.   She does get infections frequently. But she has never had an immune workup.   She does have osteoporosis and arthritis. She has seen a Rheumatologist - Dr. Amil Amen. He did not fele that there was anything that could be done for her. She was not diagnosed with RA, only osteoarthritis.   {Blank single:19197::"Food Allergy Symptom History: ***"," "}  {Blank single:19197::"Skin Symptom History: ***"," "}  {Blank single:19197::"GERD Symptom History: ***"," "}  ***Otherwise, there is no history of other atopic diseases, including {Blank multiple:19196:o:"asthma","food allergies","drug allergies","environmental allergies","stinging insect allergies","eczema","urticaria","contact dermatitis"}. There is no  significant infectious history. ***Vaccinations are up to date.    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   Dizziness 10/03/2019   OSA (obstructive sleep apnea) 08/20/2019   Slow heart rate 05/24/2019   Obstructive sleep apnea 03/09/2019   Chronic insomnia 08/29/2017   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   Excessive daytime sleepiness 08/29/2017   Intolerance of continuous positive airway pressure (CPAP) ventilation 08/29/2017   Sinusitis, chronic 01/02/2015   Cough variant asthma 06/18/2014   Dyspnea on exertion 05/13/2014   Hypercholesteremia    Atypical chest pain 04/08/2014   Asthma with acute exacerbation 04/03/2014   PNA (pneumonia) 04/02/2014   Hypokalemia 04/02/2014   CAP (community acquired pneumonia) 04/02/2014   Hypothyroidism 04/10/2013   Tachycardia 04/22/2011   HYPERLIPIDEMIA 12/04/2007   Essential hypertension 12/04/2007   ALLERGIC RHINITIS 12/04/2007   ASTHMA 12/04/2007   Headache 12/04/2007   Cough 12/04/2007    Medication List:  Allergies as of 09/14/2022       Reactions   Pollen Extract Other (See Comments)   Stuffy nose   Codeine Nausea And Vomiting        Medication List        Accurate as of September 14, 2022  2:11 PM. If you have any questions, ask your nurse or doctor.          acetaminophen 500 MG tablet Commonly known as: TYLENOL Take 500 mg by mouth every 6 (six) hours as needed (for pain.).   Acidophilus/Goat Milk Caps Take by mouth.   atorvastatin 40 MG tablet Commonly known as: LIPITOR TAKE ONE TABLET BY MOUTH ONE TIME DAILY   chlorthalidone 25 MG tablet Commonly known as: HYGROTON Take 1 tablet (25 mg total) by mouth daily. What changed: how much to take   denosumab 60 MG/ML Sosy injection Commonly known as: PROLIA Inject 60 mg into the skin every 6 (six) months.   ergocalciferol 1.25 MG (50000 UT)  capsule Commonly known as: VITAMIN D2 Take 50,000 Units by mouth every Saturday.   fluticasone 50 MCG/ACT nasal spray Commonly known as: FLONASE Place 1-2 sprays into both nostrils daily as needed for allergies or rhinitis.   irbesartan 150 MG tablet Commonly known as: AVAPRO Take 1 tablet (150 mg total) by mouth every morning.   levothyroxine 100 MCG tablet Commonly known as: SYNTHROID TAKE 1 TABLET BY MOUTH ONCE DAILY   LORazepam 0.5 MG tablet Commonly known as: ATIVAN For insomnia.   meclizine 25 MG tablet Commonly known as: ANTIVERT Take 1 tablet (25 mg total) by mouth 3 (three) times daily as needed for dizziness.   omeprazole 20 MG capsule Commonly known as: PRILOSEC Take 20 mg by mouth daily before breakfast.   ondansetron 4 MG disintegrating tablet Commonly known as: ZOFRAN-ODT Take 1 tablet (4 mg total) by mouth every 8 (eight) hours as needed for nausea or vomiting.        Birth History: {Blank single:19197::"non-contributory","born premature and spent time in the NICU","born at term without complications"}  Developmental History: Audine has met all milestones on time. She has required no {Blank multiple:19196:a:"speech therapy","occupational therapy","physical therapy"}. ***non-contributory  Past Surgical History: Past Surgical History:  Procedure Laterality Date  AUGMENTATION MAMMAPLASTY Bilateral    BREAST ENHANCEMENT SURGERY     cataracts  2016   CERVICAL SPINE SURGERY     C5-7 ACDF 12/23/2015   CESAREAN SECTION  1967   COMBINED AUGMENTATION MAMMAPLASTY AND ABDOMINOPLASTY     DRUG INDUCED ENDOSCOPY N/A 07/21/2018   Procedure: DRUG INDUCED ENDOSCOPY;  Surgeon: Jerrell Belfast, MD;  Location: [place];  Service: ENT;  Laterality: N/A;  Drug induced sleep endoscopy   HEMORROIDECTOMY  2009   [place]  03/09/2019   [id number]   IMPLANTATION OF HYPOGLOSSAL NERVE STIMULATOR Right 03/09/2019   Procedure:  IMPLANTATION OF HYPOGLOSSAL NERVE STIMULATOR;  Surgeon: Jerrell Belfast, MD;  Location: [place];  Service: ENT;  Laterality: Right;     Family History: Family History  Problem Relation Age of Onset   Sjogren's syndrome Mother    CAD Father        died at 85 - massive heart attack   Emphysema Father        smoked   Asthma Father    Heart disease Father    Heart disease Sister        had open heart surgery age 64   Diabetes Sister    Stroke Sister    Asthma Brother    CAD Brother        had open heart surgery at 38   Kidney failure Brother    Coronary artery disease Other    Breast cancer Neg Hx      Social History: Lakeish lives at home with ***.    ROS     Objective:   There were no vitals taken for this visit. There is no height or weight on file to calculate BMI.     Physical Exam   Diagnostic studies:    Spirometry: results normal (FEV1: 1.45/85%, FVC: 2.14/96%, FEV1/FVC: 68%).    Spirometry consistent with normal pattern.  Xopenex nebulizer treatment  given in clinic with no improvement.  Allergy Studies: {Blank single:19197::"none","labs sent instead"," "}    {Blank single:19197::"Allergy testing results were read and interpreted by myself, documented by clinical staff."," "}         Salvatore Marvel, MD [facility] [place] [facility]

## 2022-09-15 ENCOUNTER — Other Ambulatory Visit (HOSPITAL_COMMUNITY): Payer: Self-pay

## 2022-09-15 ENCOUNTER — Telehealth: Payer: Self-pay

## 2022-09-15 NOTE — Telephone Encounter (Signed)
Patient Advocate Encounter   Received notification from [employer] Medicare Part D that prior authorization is required for Carbinoxamine Maleate 4MG  tablets   Submitted: 09-15-2022 [place]  Status is pending

## 2022-09-16 ENCOUNTER — Encounter: Payer: Self-pay | Admitting: Allergy & Immunology

## 2022-09-16 NOTE — Telephone Encounter (Signed)
PA has been APPROVED through to 06/14/2023  Approval letter has been attached in patients documents.

## 2022-09-20 LAB — STREP PNEUMONIAE 23 SEROTYPES IGG
Pneumo Ab Type 1*: 2 ug/mL (ref >1.3)
Pneumo Ab Type 12 (12F)*: 0.5 ug/mL — ABNORMAL LOW (ref >1.3)
Pneumo Ab Type 14*: 3.6 ug/mL (ref >1.3)
Pneumo Ab Type 17 (17F)*: 35.8 ug/mL (ref >1.3)
Pneumo Ab Type 19 (19F)*: 1.3 ug/mL — ABNORMAL LOW (ref >1.3)
Pneumo Ab Type 2*: 5 ug/mL (ref >1.3)
Pneumo Ab Type 20*: 2.3 ug/mL (ref >1.3)
Pneumo Ab Type 22 (22F)*: 2.2 ug/mL (ref >1.3)
Pneumo Ab Type 23 (23F)*: 15.1 ug/mL (ref >1.3)
Pneumo Ab Type 26 (6B)*: 0.4 ug/mL — ABNORMAL LOW (ref >1.3)
Pneumo Ab Type 3*: 0.5 ug/mL — ABNORMAL LOW (ref >1.3)
Pneumo Ab Type 34 (10A)*: 3.5 ug/mL (ref >1.3)
Pneumo Ab Type 4*: 1 ug/mL — ABNORMAL LOW (ref >1.3)
Pneumo Ab Type 43 (11A)*: 6.6 ug/mL (ref >1.3)
Pneumo Ab Type 5*: 3.3 ug/mL (ref >1.3)
Pneumo Ab Type 51 (7F)*: 1.9 ug/mL (ref >1.3)
Pneumo Ab Type 54 (15B)*: 5.7 ug/mL (ref >1.3)
Pneumo Ab Type 56 (18C)*: >12.2 ug/mL (ref >1.3)
Pneumo Ab Type 57 (19A)*: 5 ug/mL (ref >1.3)
Pneumo Ab Type 68 (9V)*: 3.6 ug/mL (ref >1.3)
Pneumo Ab Type 70 (33F)*: 5.1 ug/mL (ref >1.3)
Pneumo Ab Type 8*: 4.1 ug/mL (ref >1.3)
Pneumo Ab Type 9 (9N)*: 0.9 ug/mL — ABNORMAL LOW (ref >1.3)

## 2022-09-20 LAB — CBC WITH DIFFERENTIAL
Basophils Absolute: 0.1 10*3/uL (ref 0.0–0.2)
Basos: 1 %
EOS (ABSOLUTE): 0.2 10*3/uL (ref 0.0–0.4)
Eos: 2 %
Hematocrit: 40.3 % (ref 34.0–46.6)
Hemoglobin: 13.2 g/dL (ref 11.1–15.9)
Immature Grans (Abs): 0 10*3/uL (ref 0.0–0.1)
Immature Granulocytes: 0 %
Lymphocytes Absolute: 1.9 10*3/uL (ref 0.7–3.1)
Lymphs: 17 %
MCH: 28.6 pg (ref 26.6–33.0)
MCHC: 32.8 g/dL (ref 31.5–35.7)
MCV: 87 fL (ref 79–97)
Monocytes Absolute: 0.7 10*3/uL (ref 0.1–0.9)
Monocytes: 7 %
Neutrophils Absolute: 7.8 10*3/uL — ABNORMAL HIGH (ref 1.4–7.0)
Neutrophils: 73 %
RBC: 4.61 x10E6/uL (ref 3.77–5.28)
RDW: 12.6 % (ref 11.7–15.4)
WBC: 10.6 10*3/uL (ref 3.4–10.8)

## 2022-09-20 LAB — IGG, IGA, IGM
IgA/Immunoglobulin A, Serum: 179 mg/dL (ref 64–422)
IgG (Immunoglobin G), Serum: 1276 mg/dL (ref 586–1602)
IgM (Immunoglobulin M), Srm: 123 mg/dL (ref 26–217)

## 2022-09-20 LAB — COMPLEMENT, TOTAL: Compl, Total (CH50): >60 U/mL (ref >41)

## 2022-09-20 LAB — DIPHTHERIA / TETANUS ANTIBODY PANEL
Diphtheria Ab: 0.49 IU/mL (ref <0.10)
Tetanus Ab, IgG: 4.78 IU/mL (ref <0.10)

## 2022-09-28 ENCOUNTER — Ambulatory Visit: Payer: Medicare Other | Admitting: Allergy and Immunology

## 2022-11-16 ENCOUNTER — Encounter: Payer: Self-pay | Admitting: Allergy & Immunology

## 2022-11-16 ENCOUNTER — Other Ambulatory Visit: Payer: Self-pay

## 2022-11-16 ENCOUNTER — Ambulatory Visit: Payer: Medicare Other | Admitting: Allergy & Immunology

## 2022-11-16 VITALS — BP 130/70 | HR 64 | Temp 98.3°F | Ht 60.0 in | Wt 114.1 lb

## 2022-11-16 DIAGNOSIS — J3089 Other allergic rhinitis: Secondary | ICD-10-CM | POA: Diagnosis not present

## 2022-11-16 DIAGNOSIS — B999 Unspecified infectious disease: Secondary | ICD-10-CM | POA: Diagnosis not present

## 2022-11-16 DIAGNOSIS — J4540 Moderate persistent asthma, uncomplicated: Secondary | ICD-10-CM

## 2022-11-16 NOTE — Patient Instructions (Addendum)
1. Moderate persistent asthma, uncomplicated - Lung testing looked good.  - It seems that you have a good handle on your symptoms.  - Daily controller medication(s): Breztri two puffs twice daily with spacer  - Prior to physical activity: albuterol 2 puffs 10-15 minutes before physical activity. - Rescue medications: albuterol 4 puffs every 4-6 hours as needed - Asthma control goals:  * Full participation in all desired activities (may need albuterol before activity) * Albuterol use two time or less a week on average (not counting use with activity) * Cough interfering with sleep two time or less a month * Oral steroids no more than once a year * No hospitalizations  2. Chronic rhinitis - Previous testing today showed: indoor molds - I think you have a good handle on your symptoms. - Continue with: Atrovent (ipratropium) 0.06% one spray per nostril 3-4 times daily as needed (CAN BE OVER DRYING) - Continue with: carbinoxamine 4mg  every 8 hours as needed  3. Recurrent infections - Previous work up looked great.  I do not think that we need to do anything at all.   4. Return in about 6 months (around 05/18/2023). You can have the follow up appointment with Dr. Dellis Anes or a Nurse Practicioner (our Nurse Practitioners are excellent and always have Physician oversight!).    Please inform us of any Emergency Department visits, hospitalizations, or changes in symptoms. Call us before going to the ED for breathing or allergy symptoms since we might be able to fit you in for a sick visit. Feel free to contact us anytime with any questions, problems, or concerns.  It was a pleasure to meet you today!  Websites that have reliable patient information: 1. American Academy of Asthma, Allergy, and Immunology: www.aaaai.org 2. Food Allergy Research and Education (FARE): foodallergy.org 3. Mothers of Asthmatics: http://www.asthmacommunitynetwork.org 4. American College of Allergy, Asthma, and  Immunology: www.acaai.org   COVID-19 Vaccine Information can be found at: [email or web address] For questions related to vaccine distribution or appointments, please email [email or web address] or call [phone].   We realize that you might be concerned about having an allergic reaction to the COVID19 vaccines. To help with that concern, WE ARE OFFERING THE COVID19 VACCINES IN OUR OFFICE! Ask the front desk for dates!     "Like" [name] on Facebook and Instagram for our latest updates!      A healthy democracy works best when [employer] participate! Make sure you are registered to vote! If you have moved or changed any of your contact information, you will need to get this updated before voting!  In some cases, you MAY be able to register to vote online: [email or web address]

## 2022-11-16 NOTE — Progress Notes (Signed)
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  She has made her inhaler more of a as needed medication which I think is appropriate.  I think COVID-19 infections cause lung damage which led to her prolonged coughing illness.  It seems that she has now stable and doing well without daily Breztri.  I think we can see her again in 6 months or earlier if needed.  Plan/Recommendations:   1. Moderate persistent asthma, uncomplicated - Lung testing looked good.  - It seems that you have a good handle on your symptoms.  - Daily controller medication(s): Breztri two puffs twice daily with spacer  - Prior to physical activity: albuterol 2 puffs 10-15 minutes before physical activity. - Rescue medications: albuterol 4 puffs every 4-6 hours as needed - Asthma control goals:  * Full participation in all desired activities (may need albuterol before activity) * Albuterol use two time or less a week on average (not counting use with activity) * Cough interfering with sleep two time or less a month * Oral steroids no more than once a year * No hospitalizations  2. Chronic rhinitis - Previous testing today showed: indoor molds - I think you have a good handle on your symptoms. - Continue with: Atrovent (ipratropium) 0.06% one spray per nostril 3-4 times daily as needed (CAN BE OVER DRYING) - Continue with: carbinoxamine 4mg  every 8 hours as needed  3. Recurrent infections - Previous work up looked great.  I do not think that we need to do anything at all.   4. Return in about 6 months (around 05/18/2023). You can have the follow up appointment with Dr. Dellis Anes or a  Nurse Practicioner (our Nurse Practitioners are excellent and always have Physician oversight!).    Subjective:   Elizabeth Walker is a 77 y.o. 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   Dizziness 10/03/2019   OSA (obstructive sleep apnea) 08/20/2019   Slow heart rate 05/24/2019   Obstructive sleep apnea 03/09/2019   Chronic insomnia 08/29/2017   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   Excessive daytime sleepiness 08/29/2017   Intolerance of continuous positive airway pressure (CPAP) ventilation 08/29/2017   Sinusitis, chronic 01/02/2015   Cough variant asthma 06/18/2014   Dyspnea on exertion 05/13/2014   Hypercholesteremia    Atypical chest pain 04/08/2014   Asthma with acute exacerbation 04/03/2014   PNA (pneumonia) 04/02/2014   Hypokalemia 04/02/2014   CAP (community acquired pneumonia) 04/02/2014   Hypothyroidism 04/10/2013   Tachycardia 04/22/2011   HYPERLIPIDEMIA 12/04/2007   Essential hypertension 12/04/2007   ALLERGIC RHINITIS 12/04/2007   ASTHMA 12/04/2007   Headache 12/04/2007   Cough 12/04/2007  History obtained from: chart review and patient.  Elizabeth Walker is a 77 y.o. female presenting for a follow up visit.  She was last seen in April 2024.  At that time, her lung testing looked good and she did not have any improvement with the nebulizer.  We started her on Breztri 2 puffs twice daily because of her symptoms.  For her rhinitis, she had testing that was positive to indoor molds  only.  We stopped all of her medications except for Atrovent.  We started her on carbinoxamine 4 mg every 8 hours.  For her recurrent infections, we obtained an immune workup.  Her immune workup was entirely normal.  She did have an elevated neutrophil count.  Since last visit, she has done well.   Asthma/Respiratory Symptom History: She is used her Markus Daft consistently for two weeks and then she weaned off of it. Now she has not needed it in a while. She did have COVID 3 times back to back and she had trouble getting over it.  She is not having any nighttime coughing or wheezing. She does have coughing spells around once per day or so, sometimes twice. She did join a yoga class and she is able to keep up which is good for her. She thinks that she was last able to do a yoga class five year ago.   Allergic Rhinitis Symptom History: She will have a sneezing spell occasionally.  She thinks that her eyes are still watery. This is notable when she walks outside. She has been using Atrovent nasal spray with improvement she uses the cabinxoamine once daily.   She was recently diagnosed with TMJ.  They are going to be fitting her for a mouthguard and performing prolotherapy with injections into her TMJ.  She is optimistic that this is going to help.  She has a busy summer planned.  They are taking very big trip to [place] to check out [employer] and stopping by [employer]. They might have a mint julep while they are there. They are going to head to [facility] for one week later in the summer as well to spend time with some family. The rented a [facility] house for this.   Otherwise, there have been no changes to her past medical history, surgical history, family history, or social history.    Review of Systems  Constitutional: Negative.  Negative for chills, fever, malaise/fatigue and weight loss.  HENT:  Negative for congestion, ear discharge, ear pain and sinus pain.   Eyes:  Negative for double  vision, pain, discharge and redness.  Respiratory:  Negative for cough, sputum production, shortness of breath and wheezing.   Cardiovascular: Negative.  Negative for chest pain and palpitations.  Gastrointestinal:  Negative for abdominal pain, constipation, diarrhea, heartburn, nausea and vomiting.  Skin: Negative.  Negative for itching and rash.  Neurological:  Negative for dizziness and headaches.  Endo/Heme/Allergies:  Positive for environmental allergies. Does not bruise/bleed easily.       Objective:   Blood pressure 130/70, pulse 64, temperature 98.3 F (36.8 C), height 5' (1.524 m), weight 114 lb 1.6 oz (51.8 kg), SpO2 100 %. Body mass index is 22.28 kg/m.    Physical Exam Vitals reviewed.  Constitutional:      Appearance: She is well-developed.  HENT:     Head: Normocephalic and atraumatic.     Right Ear: Tympanic membrane, ear canal and external ear normal. No drainage, swelling or tenderness. Tympanic membrane is not injected, scarred,  erythematous, retracted or bulging.     Left Ear: Tympanic membrane, ear canal and external ear normal. No drainage, swelling or tenderness. Tympanic membrane is not injected, scarred, erythematous, retracted or bulging.     Nose: No nasal deformity, septal deviation, mucosal edema or rhinorrhea.     Right Turbinates: Enlarged. Not swollen or pale.     Left Turbinates: Enlarged. Not swollen or pale.     Right Sinus: No maxillary sinus tenderness or frontal sinus tenderness.     Left Sinus: No maxillary sinus tenderness or frontal sinus tenderness.     Comments: No nasal polyps.    Mouth/Throat:     Mouth: Mucous membranes are not pale and not dry.     Pharynx: Uvula midline.  Eyes:     General:        Right eye: No discharge.        Left eye: No discharge.     Conjunctiva/sclera: Conjunctivae normal.     Right eye: Right conjunctiva is not injected. No chemosis.    Left eye: Left conjunctiva is not injected. No chemosis.     Pupils: Pupils are equal, round, and reactive to light.  Cardiovascular:     Rate and Rhythm: Normal rate and regular rhythm.     Heart sounds: Normal heart sounds.  Pulmonary:     Effort: Pulmonary effort is normal. No tachypnea, accessory muscle usage or respiratory distress.     Breath sounds: Decreased breath sounds present. No wheezing, rhonchi or rales.     Comments: Somewhat decreased air movement at the bases.  Chest:     Chest wall: No tenderness.  Abdominal:     Tenderness: There is no abdominal tenderness. There is no guarding or rebound.  Lymphadenopathy:     Head:     Right side of head: No submandibular, tonsillar or occipital adenopathy.     Left side of head: No submandibular, tonsillar or occipital adenopathy.     Cervical: No cervical adenopathy.  Skin:    Coloration: Skin is not pale.     Findings: No abrasion, erythema, petechiae or rash. Rash is not papular, urticarial or vesicular.  Neurological:     Mental Status: She is alert.  Psychiatric:        Behavior: Behavior is cooperative.      Diagnostic studies:   Spirometry: results normal (FEV1: 1.52/89%, FVC: 2.05/(93%, FEV1/FVC: 74%).    Spirometry consistent with normal pattern.   Allergy Studies: none        Malachi Bonds, MD  [facility]

## 2022-11-17 NOTE — Addendum Note (Signed)
Addended by: Philipp Deputy on: 11/17/2022 12:27 PM   Modules accepted: Orders

## 2022-12-07 ENCOUNTER — Ambulatory Visit (INDEPENDENT_AMBULATORY_CARE_PROVIDER_SITE_OTHER): Payer: Medicare Other | Admitting: Allergy & Immunology

## 2022-12-07 VITALS — BP 140/70 | HR 75 | Temp 97.4°F | Resp 16 | Wt 110.4 lb

## 2022-12-07 DIAGNOSIS — J0100 Acute maxillary sinusitis, unspecified: Secondary | ICD-10-CM | POA: Diagnosis not present

## 2022-12-07 DIAGNOSIS — J4541 Moderate persistent asthma with (acute) exacerbation: Secondary | ICD-10-CM

## 2022-12-07 MED ORDER — BUDESONIDE 0.5 MG/2ML IN SUSP: RESPIRATORY_TRACT | 3 refills | Status: DC

## 2022-12-07 MED ORDER — AMOXICILLIN-POT CLAVULANATE 875-125 MG PO TABS: 1.0000 | ORAL_TABLET | Freq: Two times a day (BID) | ORAL | 0 refills | Status: AC

## 2022-12-07 MED ORDER — SUCRALFATE 1 G PO TABS: 1.0000 g | ORAL_TABLET | Freq: Three times a day (TID) | ORAL | 0 refills | Status: DC

## 2022-12-07 MED ORDER — ALBUTEROL SULFATE (2.5 MG/3ML) 0.083% IN NEBU: INHALATION_SOLUTION | RESPIRATORY_TRACT | 3 refills | Status: DC

## 2022-12-07 MED ORDER — HYDROCODONE-ACETAMINOPHEN 7.5-325 MG/15ML PO SOLN: 15.0000 mL | Freq: Four times a day (QID) | ORAL | 0 refills | Status: DC | PRN

## 2022-12-07 NOTE — Patient Instructions (Addendum)
1. Moderate persistent asthma, uncomplicated - Lung testing not done today since you were feeling bad. - COVID testing was negative.  - Let's add some steroid and albuterol to your nebulizer machine.  - Start budesonide mixed albuterol every 4 hours during the day for one week and then twice daily for one week and then STOP.  - Nebulizer machine provided.  - Continue with the doxycycline and add on Augmentin twice daily for ten days. - This has different coverage compared to the doxycycline. - Complete the prednisone taper provided.  - Daily controller medication(s): Breztri two puffs twice daily with spacer  - Prior to physical activity: albuterol 2 puffs 10-15 minutes before physical activity. - Rescue medications: albuterol 4 puffs every 4-6 hours as needed - Asthma control goals:  * Full participation in all desired activities (may need albuterol before activity) * Albuterol use two time or less a week on average (not counting use with activity) * Cough interfering with sleep two time or less a month * Oral steroids no more than once a year * No hospitalizations  2. GERD - with current flare likely from worsening  - Restart Pepcid 20 mg twice daily. - Add on Carafate 1 gm three times daily for two weeks.  - The prednisone that you are on might be making your reflux worse which is also making your coughing/congestion worse.   3. Return in about 6 weeks (around 01/18/2023). You can have the follow up appointment with Dr. Dellis Anes or a Nurse Practicioner (our Nurse Practitioners are excellent and always have Physician oversight!).    Please inform us of any Emergency Department visits, hospitalizations, or changes in symptoms. Call us before going to the ED for breathing or allergy symptoms since we might be able to fit you in for a sick visit. Feel free to contact us anytime with any questions, problems, or concerns.  It was a pleasure to see you again today!  Websites that have  reliable patient information: 1. American Academy of Asthma, Allergy, and Immunology: www.aaaai.org 2. Food Allergy Research and Education (FARE): foodallergy.org 3. Mothers of Asthmatics: http://www.asthmacommunitynetwork.org 4. American College of Allergy, Asthma, and Immunology: www.acaai.org   COVID-19 Vaccine Information can be found at: [email or web address] For questions related to vaccine distribution or appointments, please email [email or web address] or call [phone].   We realize that you might be concerned about having an allergic reaction to the COVID19 vaccines. To help with that concern, WE ARE OFFERING THE COVID19 VACCINES IN OUR OFFICE! Ask the front desk for dates!     "Like" [name] on Facebook and Instagram for our latest updates!      A healthy democracy works best when [employer] participate! Make sure you are registered to vote! If you have moved or changed any of your contact information, you will need to get this updated before voting!  In some cases, you MAY be able to register to vote online: [email or web address]

## 2022-12-07 NOTE — Progress Notes (Unsigned)
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 Moderate persistent asthma, uncomplicated - Lung testing not done today since you were feeling bad. - COVID testing was negative.  - Let's add some steroid and albuterol to your nebulizer machine.  - Start budesonide mixed albuterol every 4 hours during the day for one week and then twice daily for one week and then STOP.  - Nebulizer machine provided.  - Continue with the doxycycline and add on Augmentin twice daily for ten days. - This has different coverage compared to the doxycycline. - Complete the prednisone taper provided.  - Daily controller medication(s): Breztri two puffs twice daily with spacer  - Prior to physical activity: albuterol 2 puffs 10-15 minutes before physical activity. - Rescue medications: albuterol 4 puffs every 4-6 hours as needed - Asthma control goals:  * Full participation in all desired activities (may need albuterol before activity) * Albuterol use two time or less a week on average (not counting use with activity) * Cough interfering with sleep two time or less a month * Oral steroids no more than once a year * No hospitalizations  2. GERD - with current flare likely from worsening  - Restart Pepcid 20 mg twice daily. - Add on Carafate 1 gm three times daily for two weeks.  - The prednisone that you are on might be making your reflux worse which is also making your coughing/congestion worse.   3. Return in about 6 weeks (around 01/18/2023). You can have the follow up appointment with Dr. Dellis Anes or a Nurse Practicioner (our  Nurse Practitioners are excellent and always have Physician oversight!).    Subjective:   Elizabeth Walker is a 77 y.o. female presenting today for follow up of  Chief Complaint  Patient presents with   Asthma    Flare:  dry cough wheezing, soc, fatigue, loss of appetite, clear thick mucous Started last week Monday.    Other    Bronchitis and inflammation in lungs. Currently on prednisone and got prednisone shot today and no better.    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   Dizziness 10/03/2019   OSA (obstructive sleep apnea) 08/20/2019   Slow heart rate 05/24/2019   Obstructive sleep apnea 03/09/2019   Chronic insomnia 08/29/2017   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   Excessive daytime sleepiness 08/29/2017   Intolerance of continuous positive airway pressure (CPAP) ventilation 08/29/2017   Sinusitis, chronic 01/02/2015   Cough variant asthma 06/18/2014   Dyspnea on exertion 05/13/2014   Hypercholesteremia    Atypical chest pain 04/08/2014   Asthma with acute exacerbation 04/03/2014   PNA (pneumonia) 04/02/2014   Hypokalemia 04/02/2014   CAP (community acquired pneumonia) 04/02/2014   Hypothyroidism 04/10/2013   Tachycardia 04/22/2011   HYPERLIPIDEMIA 12/04/2007  Essential hypertension 12/04/2007   ALLERGIC RHINITIS 12/04/2007   ASTHMA 12/04/2007   Headache 12/04/2007   Cough 12/04/2007    History obtained from: chart review and patient.  Elizabeth Walker is a 77 y.o. female presenting for a sick visit.  She was last seen in June 2024.  At that time, her lung testing looked good.  We continue with  Breztri 2 puffs twice daily as well as albuterol as needed.  She had previous testing that was positive to indoor molds.  We continued her on Atrovent since this is working very well as well as carbinoxamine as needed.  She had a previous workup for recurrent infections which was excellent.  In the interim, she started getting sick around two weeks ago. Her husband got sick after I saw her and his PCP did testing that was all negative.  COVID testing was negative as well as other test.  She has been using over-the-counter medications without much improvement.  She has been coughing and wheezing quite a bit with some productive cough.  She was recently treated with a round of doxycycline which did not work and she has been on prednisone.  Nothing is really seem to work.  She has been using Delsym with some improvement.  Otherwise, there have been no changes to her past medical history, surgical history, family history, or social history.    Review of Systems  Constitutional: Negative.  Negative for chills, fever, malaise/fatigue and weight loss.  HENT:  Positive for congestion and sinus pain. Negative for ear discharge and ear pain.   Eyes:  Negative for double vision, pain, discharge and redness.  Respiratory:  Positive for cough, shortness of breath and wheezing. Negative for sputum production.   Cardiovascular: Negative.  Negative for chest pain and palpitations.  Gastrointestinal:  Negative for abdominal pain, constipation, diarrhea, heartburn, nausea and vomiting.  Skin: Negative.  Negative for itching and rash.  Neurological:  Negative for dizziness and headaches.  Endo/Heme/Allergies:  Positive for environmental allergies. Does not bruise/bleed easily.       Objective:   Blood pressure (!) 140/70, pulse 75, temperature (!) 97.4 F (36.3 C), temperature source Temporal, resp. rate 16, weight 110 lb 6.4 oz (50.1 kg), SpO2 97 %. Body mass index is 21.56 kg/m.    Physical  Exam Vitals reviewed.  Constitutional:      Appearance: She is well-developed.  HENT:     Head: Normocephalic and atraumatic.     Right Ear: Tympanic membrane, ear canal and external ear normal. No drainage, swelling or tenderness. Tympanic membrane is not injected, scarred, erythematous, retracted or bulging.     Left Ear: Tympanic membrane, ear canal and external ear normal. No drainage, swelling or tenderness. Tympanic membrane is not injected, scarred, erythematous, retracted or bulging.     Nose: No nasal deformity, septal deviation, mucosal edema or rhinorrhea.     Right Turbinates: Enlarged, swollen and pale.     Left Turbinates: Enlarged, swollen and pale.     Right Sinus: Maxillary sinus tenderness present. No frontal sinus tenderness.     Left Sinus: Maxillary sinus tenderness present. No frontal sinus tenderness.     Comments: No nasal polyps.    Mouth/Throat:     Mouth: Mucous membranes are not pale and not dry.     Pharynx: Uvula midline.  Eyes:     General:        Right eye: No discharge.        Left eye: No discharge.  Conjunctiva/sclera: Conjunctivae normal.     Right eye: Right conjunctiva is not injected. No chemosis.    Left eye: Left conjunctiva is not injected. No chemosis.    Pupils: Pupils are equal, round, and reactive to light.  Cardiovascular:     Rate and Rhythm: Normal rate and regular rhythm.     Heart sounds: Normal heart sounds.  Pulmonary:     Effort: Pulmonary effort is normal. No tachypnea, accessory muscle usage or respiratory distress.     Breath sounds: Examination of the right-middle field reveals decreased breath sounds. Examination of the left-middle field reveals decreased breath sounds. Examination of the right-lower field reveals decreased breath sounds. Examination of the left-lower field reveals decreased breath sounds. Decreased breath sounds present. No wheezing, rhonchi or rales.     Comments: Somewhat decreased air movement at the  bases.  Chest:     Chest wall: No tenderness.  Abdominal:     Tenderness: There is no abdominal tenderness. There is no guarding or rebound.  Lymphadenopathy:     Head:     Right side of head: No submandibular, tonsillar or occipital adenopathy.     Left side of head: No submandibular, tonsillar or occipital adenopathy.     Cervical: No cervical adenopathy.  Skin:    Coloration: Skin is not pale.     Findings: No abrasion, erythema, petechiae or rash. Rash is not papular, urticarial or vesicular.  Neurological:     Mental Status: She is alert.  Psychiatric:        Behavior: Behavior is cooperative.      Diagnostic studies: none      Malachi Bonds, MD  [facility]

## 2022-12-09 ENCOUNTER — Emergency Department (HOSPITAL_COMMUNITY): Payer: Medicare Other

## 2022-12-09 ENCOUNTER — Encounter: Payer: Self-pay | Admitting: Allergy & Immunology

## 2022-12-09 ENCOUNTER — Emergency Department (HOSPITAL_COMMUNITY)
Admission: EM | Admit: 2022-12-09 | Discharge: 2022-12-09 | Disposition: A | Discharge status: Home / Self Care | Payer: Medicare Other | Attending: Emergency Medicine | Admitting: Emergency Medicine

## 2022-12-09 ENCOUNTER — Other Ambulatory Visit: Payer: Self-pay

## 2022-12-09 ENCOUNTER — Encounter (HOSPITAL_COMMUNITY): Payer: Self-pay

## 2022-12-09 DIAGNOSIS — J45909 Unspecified asthma, uncomplicated: Secondary | ICD-10-CM | POA: Diagnosis not present

## 2022-12-09 DIAGNOSIS — E039 Hypothyroidism, unspecified: Secondary | ICD-10-CM | POA: Diagnosis not present

## 2022-12-09 DIAGNOSIS — Z7952 Long term (current) use of systemic steroids: Secondary | ICD-10-CM | POA: Insufficient documentation

## 2022-12-09 DIAGNOSIS — R0789 Other chest pain: Secondary | ICD-10-CM | POA: Diagnosis present

## 2022-12-09 DIAGNOSIS — I10 Essential (primary) hypertension: Secondary | ICD-10-CM | POA: Insufficient documentation

## 2022-12-09 DIAGNOSIS — Z79899 Other long term (current) drug therapy: Secondary | ICD-10-CM | POA: Diagnosis not present

## 2022-12-09 LAB — CBC
HCT: 40.5 % (ref 36.0–46.0)
Hemoglobin: 13.3 g/dL (ref 12.0–15.0)
MCH: 28.6 pg (ref 26.0–34.0)
MCHC: 32.8 g/dL (ref 30.0–36.0)
MCV: 87.1 fL (ref 80.0–100.0)
Platelets: 334 10*3/uL (ref 150–400)
RBC: 4.65 MIL/uL (ref 3.87–5.11)
RDW: 13.4 % (ref 11.5–15.5)
WBC: 22 10*3/uL — ABNORMAL HIGH (ref 4.0–10.5)
nRBC: 0 % (ref 0.0–0.2)

## 2022-12-09 LAB — BASIC METABOLIC PANEL
Anion gap: 9 (ref 5–15)
BUN: 39 mg/dL — ABNORMAL HIGH (ref 8–23)
CO2: 26 mmol/L (ref 22–32)
Calcium: 8.6 mg/dL — ABNORMAL LOW (ref 8.9–10.3)
Chloride: 100 mmol/L (ref 98–111)
Creatinine, Ser: 1.04 mg/dL — ABNORMAL HIGH (ref 0.44–1.00)
GFR, Estimated: 55 mL/min — ABNORMAL LOW (ref >60)
Glucose, Bld: 106 mg/dL — ABNORMAL HIGH (ref 70–99)
Potassium: 3.4 mmol/L — ABNORMAL LOW (ref 3.5–5.1)
Sodium: 135 mmol/L (ref 135–145)

## 2022-12-09 LAB — TROPONIN I (HIGH SENSITIVITY)
Troponin I (High Sensitivity): 9 ng/L (ref <18)
Troponin I (High Sensitivity): 9 ng/L (ref <18)

## 2022-12-09 MED ORDER — NITROGLYCERIN 0.4 MG SL SUBL
0.4000 mg | SUBLINGUAL_TABLET | SUBLINGUAL | Status: DC | PRN
  Administered 2022-12-09: 0.4 mg via SUBLINGUAL
  Filled 2022-12-09: qty 1

## 2022-12-09 NOTE — Discharge Instructions (Addendum)
Evaluation for your chest pain was overall reassuring.  Recommend you follow-up with cardiology.  If you have worsening chest pain, shortness of breath, calf tenderness or any other concerning symptom please return emerged part for further evaluation.

## 2022-12-09 NOTE — ED Provider Notes (Signed)
[place] EMERGENCY DEPARTMENT AT [facility] Provider Note   CSN: [id number] Arrival date & time: 12/09/22  1445     History  Chief Complaint  Patient presents with   Chest Pain   Nausea   HPI Elizabeth Walker is a 77 y.o. female with history of hypertension, hyperlipidemia, hypothyroidism and asthma presenting for chest pain.  Started this morning when she woke up around 8 AM.  Chest pain is in the center of her chest and radiates to her back.  It is nonreproducible.  It feels achy and pressure-like.  Also states she was nauseous.  Symptoms have been persistent.  She called EMS.  And route she was given aspirin and she believes that her chest pain improved somewhat.  States she is also been short of breath but not unusual for her in the last couple weeks as she is recovering from a recent bronchitis infection.  States she coughed all day long 2 days prior.  Denies calf tenderness, recent immobilization and known malignancy.   Chest Pain      Home Medications Prior to Admission medications   Medication Sig Start Date End Date Taking? Authorizing Provider  acetaminophen (TYLENOL) 500 MG tablet Take 500 mg by mouth every 6 (six) hours as needed (for pain.).     [provider]  albuterol (PROVENTIL) (2.5 MG/3ML) 0.083% nebulizer solution Mix one vial with budesonide every 4 hours as needed. Wean as directed. 12/07/22   Alfonse Spruce, MD  albuterol (VENTOLIN HFA) 108 (90 Base) MCG/ACT inhaler Inhale 2 puffs into the lungs every 4 (four) hours as needed for wheezing or shortness of breath.    [provider]  albuterol (VENTOLIN HFA) 108 (90 Base) MCG/ACT inhaler Inhale 2 puffs into the lungs every 6 (six) hours as needed for wheezing or shortness of breath. 09/14/22   Alfonse Spruce, MD  amoxicillin-clavulanate (AUGMENTIN) 875-125 MG tablet Take 1 tablet by mouth 2 (two) times daily for 10 days. 12/07/22 12/17/22  Alfonse Spruce, MD   atorvastatin (LIPITOR) 40 MG tablet TAKE ONE TABLET BY MOUTH ONE TIME DAILY  04/09/14   Reather Littler, MD  azelastine (ASTELIN) 0.1 % nasal spray Place 2 sprays into both nostrils daily as needed for rhinitis. Use in each nostril as directed    [provider]  BREZTRI AEROSPHERE 160-9-4.8 MCG/ACT AERO Inhale 2 puffs into the lungs in the morning and at bedtime. 09/14/22   Alfonse Spruce, MD  budesonide (PULMICORT) 0.5 MG/2ML nebulizer solution Mix one vial with albuterol every 4 hours as needed. Wean as directed. 12/07/22   Alfonse Spruce, MD  Carbinoxamine Maleate 4 MG TABS Take 1 tablet (4 mg total) by mouth every 8 (eight) hours as needed. 09/14/22   Alfonse Spruce, MD  chlorthalidone (HYGROTON) 25 MG tablet Take 1 tablet (25 mg total) by mouth daily. Patient taking differently: Take 12.5 mg by mouth daily. 06/16/22   Runell Gess, MD  denosumab (PROLIA) 60 MG/ML SOSY injection Inject 60 mg into the skin every 6 (six) months.    [provider]  doxycycline (MONODOX) 100 MG capsule Take 100 mg by mouth 2 (two) times daily. 11/30/22   [provider]  ergocalciferol (VITAMIN D2) 50000 UNITS capsule Take 50,000 Units by mouth every Saturday.    [provider]  fluorometholone (EFLONE) 0.1 % ophthalmic suspension Place 1 drop into both eyes daily as needed.    [provider]  fluticasone [name])  50 MCG/ACT nasal spray Place 1-2 sprays into both nostrils daily as needed for allergies or rhinitis.    [provider]  HYDROcodone-acetaminophen (HYCET) 7.5-325 mg/15 ml solution Take 15 mLs by mouth 4 (four) times daily as needed for moderate pain. 12/07/22 12/07/23  Alfonse Spruce, MD  ipratropium (ATROVENT) 0.06 % nasal spray Place 2 sprays into both nostrils 3 (three) times daily as needed for rhinitis. 09/14/22   Alfonse Spruce, MD  irbesartan (AVAPRO) 150 MG tablet Take 1 tablet (150 mg total) by mouth every morning.  02/24/22   Runell Gess, MD  levothyroxine (SYNTHROID, LEVOTHROID) 100 MCG tablet TAKE 1 TABLET BY MOUTH ONCE DAILY 07/15/17   Reather Littler, MD  LORazepam (ATIVAN) 0.5 MG tablet For insomnia. 01/25/22   Dohmeier, Porfirio Mylar, MD  meclizine (ANTIVERT) 25 MG tablet Take 1 tablet (25 mg total) by mouth 3 (three) times daily as needed for dizziness. 06/03/18   Jacalyn Lefevre, MD  omeprazole (PRILOSEC) 20 MG capsule Take 20 mg by mouth daily before breakfast.    [provider]  ondansetron (ZOFRAN-ODT) 4 MG disintegrating tablet Take 1 tablet (4 mg total) by mouth every 8 (eight) hours as needed for nausea or vomiting. 12/16/21   Zenia Resides, MD  ondansetron (ZOFRAN-ODT) 8 MG disintegrating tablet Take 8 mg by mouth every 8 (eight) hours as needed. 11/30/22   [provider]  predniSONE (DELTASONE) 10 MG tablet Take 10 mg by mouth. 12/07/22   [provider]  Probiotic Product (ACIDOPHILUS/GOAT MILK) CAPS Take by mouth.    [provider]  sucralfate (CARAFATE) 1 g tablet Take 1 tablet (1 g total) by mouth 4 (four) times daily -  with meals and at bedtime for 14 days. 12/07/22 12/21/22  Alfonse Spruce, MD      Allergies    Pollen extract and Codeine    Review of Systems   Review of Systems  Cardiovascular:  Positive for chest pain.    Physical Exam Updated Vital Signs BP (!) 166/66   Pulse 61   Temp 98.1 F (36.7 C) (Oral)   Resp 16   Ht 5' 0.5" (1.537 m)   Wt 50 kg   SpO2 100%   BMI 21.17 kg/m  Physical Exam Vitals and nursing note reviewed.  HENT:     Head: Normocephalic and atraumatic.     Mouth/Throat:     Mouth: Mucous membranes are moist.  Eyes:     General:        Right eye: No discharge.        Left eye: No discharge.     Conjunctiva/sclera: Conjunctivae normal.  Cardiovascular:     Rate and Rhythm: Normal rate and regular rhythm.     Pulses: Normal pulses.     Heart sounds: Normal heart sounds.  Pulmonary:     Effort:  Pulmonary effort is normal.     Breath sounds: Normal breath sounds.  Abdominal:     General: Abdomen is flat.     Palpations: Abdomen is soft.  Skin:    General: Skin is warm and dry.  Neurological:     General: No focal deficit present.  Psychiatric:        Mood and Affect: Mood normal.     ED Results / Procedures / Treatments   Labs (all labs ordered are listed, but only abnormal results are displayed) Labs Reviewed  BASIC METABOLIC PANEL - Abnormal; Notable for the following components:  Result Value   Potassium 3.4 (*)    Glucose, Bld 106 (*)    BUN 39 (*)    Creatinine, Ser 1.04 (*)    Calcium 8.6 (*)    GFR, Estimated 55 (*)    All other components within normal limits  CBC - Abnormal; Notable for the following components:   WBC 22.0 (*)    All other components within normal limits  TROPONIN I (HIGH SENSITIVITY)  TROPONIN I (HIGH SENSITIVITY)    EKG EKG Interpretation Date/Time:  Thursday December 09 2022 14:57:49 EDT Ventricular Rate:  65 PR Interval:  158 QRS Duration:  92 QT Interval:  389 QTC Calculation: 405 R Axis:   50  Text Interpretation: Sinus rhythm Confirmed by Vivi Barrack [phone]) on 12/09/2022 7:33:13 PM  Radiology DG Chest Port 1 View  Result Date: 12/09/2022 CLINICAL DATA:  Chest pain. EXAM: PORTABLE CHEST 1 VIEW COMPARISON:  None Available. FINDINGS: Implantable loop recorder projects over the left heart border. Neurostimulator device projects over the right chest. Clear lungs. Normal heart size and mediastinal contours. No pleural effusion or pneumothorax. Visualized bones and upper abdomen are unremarkable. IMPRESSION: No evidence of acute cardiopulmonary disease. Electronically Signed   By: Orvan Falconer M.D.   On: 12/09/2022 16:01    Procedures Procedures    Medications Ordered in ED Medications  nitroGLYCERIN (NITROSTAT) SL tablet 0.4 mg (0.4 mg Sublingual Given 12/09/22 2102)    ED Course/ Medical Decision Making/  A&P Clinical Course as of 12/09/22 2138  Thu Dec 09, 2022  2058 [facility]: 28.6 [JR]    Clinical Course User Index [JR] Gareth Eagle, PA-C                             Medical Decision Making Amount and/or Complexity of Data Reviewed Labs: ordered. Decision-making details documented in ED Course. Radiology: ordered.  Risk Prescription drug management.   Initial Impression and Ddx 77 year old well-appearing female presenting for chest pain.  Exam was unremarkable.  DDx includes ACS, PE, pneumothorax, pneumonia, CHF exacerbation. Patient PMH that increases complexity of ED encounter:  history of hypertension, hyperlipidemia, hypothyroidism and asthma   Interpretation of Diagnostics - I independent reviewed and interpreted the labs as followed: Leukocytosis  - I independently visualized the following imaging with scope of interpretation limited to determining acute life threatening conditions related to emergency care: CXR, which revealed no acute process  - I personally reviewed and interpreted EKG which revealed normal sinus rhythm.  Patient Reassessment and Ultimate Disposition/Management Workup reassuring. Suspect leukocytosis related to recent bronchitis. Chest pain improved. Given her risk factors and persistence of her chest pain. Did patient with Dr. Jacinto Halim of cardiology. Advised to trial nitro. If pain relieved advised to admit to medicine for observation. If the same, could be discharged with cardiology follow-up. Administered nitro and patient endorsed no relief.  Advised to follow-up with her cardiologist. Patient mentioned that she would also like a "second opinion". Submitted ambulatory referral. Discussed pertinent return precautions.  Vital stable throughout encounter.  Discharged home in condition.  Patient management required discussion with the following services or consulting groups:  Cardiology  Complexity of Problems Addressed Acute complicated illness or  Injury  Additional Data Reviewed and Analyzed Further history obtained from: Past medical history and medications listed in the EMR, Prior ED visit notes, and Recent discharge summary  Patient Encounter Risk Assessment None        Final Clinical Impression(s) /  ED Diagnoses Final diagnoses:  Atypical chest pain    Rx / DC Orders ED Discharge Orders     None         Gareth Eagle, PA-C 12/09/22 2143    Gerhard Munch, MD 12/13/22 [phone]

## 2022-12-09 NOTE — ED Triage Notes (Signed)
Pt woke up w/ CP at 0800, no cardiac hx. Pain in L shoulder, back and into R chest. Nausea, x2 vomiting

## 2022-12-22 ENCOUNTER — Emergency Department (HOSPITAL_BASED_OUTPATIENT_CLINIC_OR_DEPARTMENT_OTHER): Payer: Medicare Other

## 2022-12-22 ENCOUNTER — Emergency Department (HOSPITAL_BASED_OUTPATIENT_CLINIC_OR_DEPARTMENT_OTHER)
Admission: EM | Admit: 2022-12-22 | Discharge: 2022-12-22 | Disposition: A | Discharge status: Home / Self Care | Payer: Medicare Other | Attending: Emergency Medicine | Admitting: Emergency Medicine

## 2022-12-22 ENCOUNTER — Encounter (HOSPITAL_BASED_OUTPATIENT_CLINIC_OR_DEPARTMENT_OTHER): Payer: Self-pay

## 2022-12-22 DIAGNOSIS — R319 Hematuria, unspecified: Secondary | ICD-10-CM | POA: Diagnosis present

## 2022-12-22 DIAGNOSIS — N3001 Acute cystitis with hematuria: Secondary | ICD-10-CM | POA: Diagnosis not present

## 2022-12-22 DIAGNOSIS — E876 Hypokalemia: Secondary | ICD-10-CM | POA: Insufficient documentation

## 2022-12-22 DIAGNOSIS — Z79899 Other long term (current) drug therapy: Secondary | ICD-10-CM | POA: Diagnosis not present

## 2022-12-22 LAB — CBC WITH DIFFERENTIAL/PLATELET
Abs Immature Granulocytes: 0.04 10*3/uL (ref 0.00–0.07)
Basophils Absolute: 0 10*3/uL (ref 0.0–0.1)
Basophils Relative: 0 %
Eosinophils Absolute: 0.2 10*3/uL (ref 0.0–0.5)
Eosinophils Relative: 2 %
HCT: 36.7 % (ref 36.0–46.0)
Hemoglobin: 12.3 g/dL (ref 12.0–15.0)
Immature Granulocytes: 0 %
Lymphocytes Relative: 9 %
Lymphs Abs: 1.3 10*3/uL (ref 0.7–4.0)
MCH: 28.5 pg (ref 26.0–34.0)
MCHC: 33.5 g/dL (ref 30.0–36.0)
MCV: 85 fL (ref 80.0–100.0)
Monocytes Absolute: 0.9 10*3/uL (ref 0.1–1.0)
Monocytes Relative: 7 %
Neutro Abs: 11.6 10*3/uL — ABNORMAL HIGH (ref 1.7–7.7)
Neutrophils Relative %: 82 %
Platelets: 211 10*3/uL (ref 150–400)
RBC: 4.32 MIL/uL (ref 3.87–5.11)
RDW: 14 % (ref 11.5–15.5)
WBC: 14.1 10*3/uL — ABNORMAL HIGH (ref 4.0–10.5)
nRBC: 0 % (ref 0.0–0.2)

## 2022-12-22 LAB — URINALYSIS, MICROSCOPIC (REFLEX)
RBC / HPF: >50 RBC/hpf (ref 0–5)
Squamous Epithelial / HPF: NONE SEEN /HPF (ref 0–5)

## 2022-12-22 LAB — URINALYSIS, ROUTINE W REFLEX MICROSCOPIC

## 2022-12-22 LAB — BASIC METABOLIC PANEL
Anion gap: 5 (ref 5–15)
BUN: 30 mg/dL — ABNORMAL HIGH (ref 8–23)
CO2: 32 mmol/L (ref 22–32)
Calcium: 8.8 mg/dL — ABNORMAL LOW (ref 8.9–10.3)
Chloride: 99 mmol/L (ref 98–111)
Creatinine, Ser: 1.05 mg/dL — ABNORMAL HIGH (ref 0.44–1.00)
GFR, Estimated: 54 mL/min — ABNORMAL LOW (ref >60)
Glucose, Bld: 96 mg/dL (ref 70–99)
Potassium: 2.9 mmol/L — ABNORMAL LOW (ref 3.5–5.1)
Sodium: 136 mmol/L (ref 135–145)

## 2022-12-22 MED ORDER — CEPHALEXIN 500 MG PO CAPS: 500.0000 mg | ORAL_CAPSULE | Freq: Two times a day (BID) | ORAL | 0 refills | Status: DC

## 2022-12-22 MED ORDER — POTASSIUM CHLORIDE CRYS ER 20 MEQ PO TBCR
40.0000 meq | EXTENDED_RELEASE_TABLET | Freq: Once | ORAL | Status: AC
  Administered 2022-12-22: 40 meq via ORAL
  Filled 2022-12-22: qty 2

## 2022-12-22 MED ORDER — SODIUM CHLORIDE 0.9 % IV SOLN: INTRAVENOUS | Status: DC | PRN

## 2022-12-22 MED ORDER — SODIUM CHLORIDE 0.9 % IV SOLN
1.0000 g | Freq: Once | INTRAVENOUS | Status: AC
  Administered 2022-12-22: 1 g via INTRAVENOUS
  Filled 2022-12-22: qty 10

## 2022-12-22 NOTE — ED Provider Notes (Signed)
[place] EMERGENCY DEPARTMENT AT [facility] Provider Note   CSN: [id number] Arrival date & time: 12/22/22  4098     History  Chief Complaint  Patient presents with   Hematuria    Elizabeth Walker is a 77 y.o. female.  Patient is a 77 year old female who presents with hematuria.  She says she is having about a 2-week history of some intermittent burning when she urinates.  She started having some increased pressure and lower abdominal discomfort over the last couple days and then today noticed some blood in her urine.  She has noted some clots throughout the morning.  It just started earlier this morning.  She denies any known fevers.  She had some nausea with a recent bronchitis-like illness but that has improved.  She has mild discomfort in her lower back.  No history of recurrent UTIs or other episodes of hematuria.       Home Medications Prior to Admission medications   Medication Sig Start Date End Date Taking? Authorizing Provider  cephALEXin (KEFLEX) 500 MG capsule Take 1 capsule (500 mg total) by mouth 2 (two) times daily. 12/22/22  Yes Rolan Bucco, MD  acetaminophen (TYLENOL) 500 MG tablet Take 500 mg by mouth every 6 (six) hours as needed (for pain.).     [provider]  albuterol (PROVENTIL) (2.5 MG/3ML) 0.083% nebulizer solution Mix one vial with budesonide every 4 hours as needed. Wean as directed. 12/07/22   Alfonse Spruce, MD  albuterol (VENTOLIN HFA) 108 (90 Base) MCG/ACT inhaler Inhale 2 puffs into the lungs every 4 (four) hours as needed for wheezing or shortness of breath.    [provider]  albuterol (VENTOLIN HFA) 108 (90 Base) MCG/ACT inhaler Inhale 2 puffs into the lungs every 6 (six) hours as needed for wheezing or shortness of breath. 09/14/22   Alfonse Spruce, MD  atorvastatin (LIPITOR) 40 MG tablet TAKE ONE TABLET BY MOUTH ONE TIME DAILY  04/09/14   Reather Littler, MD  azelastine (ASTELIN) 0.1 % nasal spray Place 2  sprays into both nostrils daily as needed for rhinitis. Use in each nostril as directed    [provider]  BREZTRI AEROSPHERE 160-9-4.8 MCG/ACT AERO Inhale 2 puffs into the lungs in the morning and at bedtime. 09/14/22   Alfonse Spruce, MD  budesonide (PULMICORT) 0.5 MG/2ML nebulizer solution Mix one vial with albuterol every 4 hours as needed. Wean as directed. 12/07/22   Alfonse Spruce, MD  Carbinoxamine Maleate 4 MG TABS Take 1 tablet (4 mg total) by mouth every 8 (eight) hours as needed. 09/14/22   Alfonse Spruce, MD  chlorthalidone (HYGROTON) 25 MG tablet Take 1 tablet (25 mg total) by mouth daily. Patient taking differently: Take 12.5 mg by mouth daily. 06/16/22   Runell Gess, MD  denosumab (PROLIA) 60 MG/ML SOSY injection Inject 60 mg into the skin every 6 (six) months.    [provider]  doxycycline (MONODOX) 100 MG capsule Take 100 mg by mouth 2 (two) times daily. 11/30/22   [provider]  ergocalciferol (VITAMIN D2) 50000 UNITS capsule Take 50,000 Units by mouth every Saturday.    [provider]  fluorometholone (EFLONE) 0.1 % ophthalmic suspension Place 1 drop into both eyes daily as needed.    [provider]  fluticasone (FLONASE) 50 MCG/ACT nasal spray Place 1-2 sprays into both nostrils daily as needed for allergies or rhinitis.    [provider]  HYDROcodone-acetaminophen (HYCET)  7.5-325 mg/15 ml solution Take 15 mLs by mouth 4 (four) times daily as needed for moderate pain. 12/07/22 12/07/23  Alfonse Spruce, MD  ipratropium (ATROVENT) 0.06 % nasal spray Place 2 sprays into both nostrils 3 (three) times daily as needed for rhinitis. 09/14/22   Alfonse Spruce, MD  irbesartan (AVAPRO) 150 MG tablet Take 1 tablet (150 mg total) by mouth every morning. 02/24/22   Runell Gess, MD  levothyroxine (SYNTHROID, LEVOTHROID) 100 MCG tablet TAKE 1 TABLET BY MOUTH ONCE DAILY 07/15/17   Reather Littler, MD   LORazepam (ATIVAN) 0.5 MG tablet For insomnia. 01/25/22   Dohmeier, Porfirio Mylar, MD  meclizine (ANTIVERT) 25 MG tablet Take 1 tablet (25 mg total) by mouth 3 (three) times daily as needed for dizziness. 06/03/18   Jacalyn Lefevre, MD  omeprazole (PRILOSEC) 20 MG capsule Take 20 mg by mouth daily before breakfast.    [provider]  ondansetron (ZOFRAN-ODT) 4 MG disintegrating tablet Take 1 tablet (4 mg total) by mouth every 8 (eight) hours as needed for nausea or vomiting. 12/16/21   Zenia Resides, MD  ondansetron (ZOFRAN-ODT) 8 MG disintegrating tablet Take 8 mg by mouth every 8 (eight) hours as needed. 11/30/22   [provider]  predniSONE (DELTASONE) 10 MG tablet Take 10 mg by mouth. 12/07/22   [provider]  Probiotic Product (ACIDOPHILUS/GOAT MILK) CAPS Take by mouth.    [provider]  sucralfate (CARAFATE) 1 g tablet Take 1 tablet (1 g total) by mouth 4 (four) times daily -  with meals and at bedtime for 14 days. 12/07/22 12/21/22  Alfonse Spruce, MD      Allergies    Pollen extract and Codeine    Review of Systems   Review of Systems  Constitutional:  Negative for chills, diaphoresis, fatigue and fever.  HENT:  Negative for congestion, rhinorrhea and sneezing.   Eyes: Negative.   Respiratory:  Negative for cough, chest tightness and shortness of breath.   Cardiovascular:  Negative for chest pain and leg swelling.  Gastrointestinal:  Positive for abdominal pain. Negative for blood in stool, diarrhea, nausea and vomiting.  Genitourinary:  Positive for hematuria. Negative for difficulty urinating, flank pain and frequency.  Musculoskeletal:  Negative for arthralgias and back pain.  Skin:  Negative for rash.  Neurological:  Negative for dizziness, speech difficulty, weakness, numbness and headaches.    Physical Exam Updated Vital Signs BP 134/61 (BP Location: Right Arm)   Pulse 66   Temp 98.1 F (36.7 C) (Oral)   Resp 18   Ht 5' (1.524  m)   Wt 50.3 kg   SpO2 100%   BMI 21.68 kg/m  Physical Exam Constitutional:      Appearance: She is well-developed.  HENT:     Head: Normocephalic and atraumatic.  Eyes:     Pupils: Pupils are equal, round, and reactive to light.  Cardiovascular:     Rate and Rhythm: Normal rate and regular rhythm.     Heart sounds: Normal heart sounds.  Pulmonary:     Effort: Pulmonary effort is normal. No respiratory distress.     Breath sounds: Normal breath sounds. No wheezing or rales.  Chest:     Chest wall: No tenderness.  Abdominal:     General: Bowel sounds are normal.     Palpations: Abdomen is soft.     Tenderness: There is abdominal tenderness (Mild tenderness across the lower abdomen bilaterally). There is no guarding or rebound.  Musculoskeletal:        General: Normal range of motion.     Cervical back: Normal range of motion and neck supple.  Lymphadenopathy:     Cervical: No cervical adenopathy.  Skin:    General: Skin is warm and dry.     Findings: No rash.  Neurological:     Mental Status: She is alert and oriented to person, place, and time.     ED Results / Procedures / Treatments   Labs (all labs ordered are listed, but only abnormal results are displayed) Labs Reviewed  URINALYSIS, ROUTINE W REFLEX MICROSCOPIC - Abnormal; Notable for the following components:      Result Value   Color, Urine RED (*)    APPearance TURBID (*)    Glucose, UA   (*)    Value: TEST NOT REPORTED DUE TO COLOR INTERFERENCE OF URINE PIGMENT   Hgb urine dipstick   (*)    Value: TEST NOT REPORTED DUE TO COLOR INTERFERENCE OF URINE PIGMENT   Bilirubin Urine   (*)    Value: TEST NOT REPORTED DUE TO COLOR INTERFERENCE OF URINE PIGMENT   Ketones, ur   (*)    Value: TEST NOT REPORTED DUE TO COLOR INTERFERENCE OF URINE PIGMENT   Protein, ur   (*)    Value: TEST NOT REPORTED DUE TO COLOR INTERFERENCE OF URINE PIGMENT   Nitrite   (*)    Value: TEST NOT REPORTED DUE TO COLOR INTERFERENCE OF  URINE PIGMENT   Leukocytes,Ua   (*)    Value: TEST NOT REPORTED DUE TO COLOR INTERFERENCE OF URINE PIGMENT   All other components within normal limits  BASIC METABOLIC PANEL - Abnormal; Notable for the following components:   Potassium 2.9 (*)    BUN 30 (*)    Creatinine, Ser 1.05 (*)    Calcium 8.8 (*)    GFR, Estimated 54 (*)    All other components within normal limits  CBC WITH DIFFERENTIAL/PLATELET - Abnormal; Notable for the following components:   WBC 14.1 (*)    Neutro Abs 11.6 (*)    All other components within normal limits  URINALYSIS, MICROSCOPIC (REFLEX) - Abnormal; Notable for the following components:   Bacteria, UA RARE (*)    Non Squamous Epithelial PRESENT (*)    All other components within normal limits    EKG None  Radiology CT Renal Stone Study  Result Date: 12/22/2022 CLINICAL DATA:  Flank pain, hematuria. EXAM: CT ABDOMEN AND PELVIS WITHOUT CONTRAST TECHNIQUE: Multidetector CT imaging of the abdomen and pelvis was performed following the standard protocol without IV contrast. RADIATION DOSE REDUCTION: This exam was performed according to the departmental dose-optimization program which includes automated exposure control, adjustment of the mA and/or kV according to patient size and/or use of iterative reconstruction technique. COMPARISON:  May 19, 2022. FINDINGS: Lower chest: No acute abnormality. Hepatobiliary: No focal liver abnormality is seen. No gallstones, gallbladder wall thickening, or biliary dilatation. Pancreas: Unremarkable. No pancreatic ductal dilatation or surrounding inflammatory changes. Spleen: Normal in size without focal abnormality. Adrenals/Urinary Tract: Adrenal glands are unremarkable. Kidneys are normal, without renal calculi, focal lesion, or hydronephrosis. Bladder is unremarkable. Stomach/Bowel: Stomach is unremarkable. There is no evidence of bowel obstruction or inflammation. Vascular/Lymphatic: Aortic atherosclerosis. No enlarged  abdominal or pelvic lymph nodes. Reproductive: Uterus and bilateral adnexa are unremarkable. Other: No abdominal wall hernia or abnormality. No abdominopelvic ascites. Musculoskeletal: No acute or significant osseous findings. IMPRESSION: No acute abnormality seen in the  abdomen or pelvis. Aortic Atherosclerosis (ICD10-I70.0). Electronically Signed   By: Lupita Raider M.D.   On: 12/22/2022 10:37    Procedures Procedures    Medications Ordered in ED Medications  cefTRIAXone (ROCEPHIN) 1 g in sodium chloride 0.9 % 100 mL IVPB (1 g Intravenous New Bag/Given 12/22/22 1113)  0.9 %  sodium chloride infusion ( Intravenous New Bag/Given 12/22/22 1111)  potassium chloride SA (KLOR-CON M) CR tablet 40 mEq (40 mEq Oral Given 12/22/22 1115)    ED Course/ Medical Decision Making/ A&P                             Medical Decision Making Amount and/or Complexity of Data Reviewed Labs: ordered. Radiology: ordered.  Risk Prescription drug management.   Patient is a 77 year old who presents with hematuria.  She is also had some burning on urination and some lower abdominal pain.  Her urine is red.  Microscopic shows white cells and some bacteria.  It was sent for culture.  CT does not show any evidence of kidney stones or renal mass.  Her WBC count is a little bit elevated.  However she is afebrile and otherwise is well-appearing.  Her potassium is a little low and was given a dose of potassium replacement.  Overall I do not see an indication for hospitalization.  She was given a dose of IV Rocephin.  She is able to urinate without too much difficulty.  She was discharged home in good condition.  She was started on Keflex.  Encouraged her to drink water through the day to flush out the clots.  She was encouraged to have close follow-up with her PCP.  Return precautions were given.  Final Clinical Impression(s) / ED Diagnoses Final diagnoses:  Acute cystitis with hematuria  Hypokalemia    Rx / DC  Orders ED Discharge Orders          Ordered    cephALEXin (KEFLEX) 500 MG capsule  2 times daily        12/22/22 1103              Rolan Bucco, MD 12/22/22 1123

## 2022-12-22 NOTE — Discharge Instructions (Addendum)
Take antibiotics as best.  Drink water through the day to flush out your blood clots.  Follow-up with your primary care doctor within the next few days.  Return to emergency room if you have any worsening symptoms such as worsening pain, fevers, vomiting or inability to urinate.

## 2022-12-22 NOTE — ED Triage Notes (Signed)
Pt reports to ED with complaints of blood in her urine. Pt states that this morning she started having some lower abdominal pain and went to the bathroom and noted the blood in urine and she was passing clot that were about the size of her pinky.

## 2023-01-17 ENCOUNTER — Telehealth: Payer: Self-pay | Admitting: Cardiovascular Disease

## 2023-01-17 NOTE — Telephone Encounter (Signed)
Pt c/o BP issue: STAT if pt c/o blurred vision, one-sided weakness or slurred speech  1. What are your last 5 BP readings? 85/58  2. Are you having any other symptoms (ex. Dizziness, headache, blurred vision, passed out)? Dizziness, slight  headache, lightheaded and feel like passing out or vomiting.   3. What is your BP issue? Pt is concerned with her BP being so low as of recently.

## 2023-01-17 NOTE — Telephone Encounter (Signed)
Patient noticed her BP has been running low for two weeks. 100-70 SBP/70-50 diastolic. She was only able to give me a range. Today her blood pressure was 85/58  hr 60, 77/62. She states she feels dizzy like she is going to pass out. Also has some nausea. Advised she should go to the ED to be seen. She decline.Advised to hold al BP medications (irbesartan and chlorthalidone). Staying hydrated and eating. Change positions slowly. Will call back after speaking to DOD  Micah Flesher to speak to DOD and he agrees patient should be seen in ED  Spoke with patient and she states she will go to ED

## 2023-01-19 ENCOUNTER — Ambulatory Visit: Payer: Medicare Other | Admitting: Cardiovascular Disease

## 2023-01-19 NOTE — Telephone Encounter (Signed)
Patient was seen by her medical doctor yesterday.

## 2023-01-20 ENCOUNTER — Encounter: Payer: Self-pay | Admitting: Pulmonary Disease

## 2023-01-20 ENCOUNTER — Ambulatory Visit: Payer: Medicare Other | Admitting: Pulmonary Disease

## 2023-01-20 VITALS — BP 118/64 | HR 67 | Ht 60.0 in | Wt 109.6 lb

## 2023-01-20 DIAGNOSIS — J4550 Severe persistent asthma, uncomplicated: Secondary | ICD-10-CM

## 2023-01-20 NOTE — Patient Instructions (Signed)
Nice to meet you  We will get some extra lab work today to evaluate the best medicines for treating asthma moving forward  Based on the results of that we can move forward with the decision in the next couple of weeks start the process of obtaining the new medicine  As we discussed these are injections  I ordered pulmonary function test for further evaluation, ideally we would get these done prior to your next visit so we can go over the results and discuss in detail.  Return to clinic in 2 months after PFT or sooner with Dr. Judeth Horn

## 2023-01-20 NOTE — Progress Notes (Signed)
@Patient  ID: Elizabeth Walker, female    DOB: 08-25-1945, 77 y.o.   MRN: [id number]  Chief Complaint  Patient presents with   Consult    Referred by PCP for cough and asthma. Has noticed an increase in DOE for the past year. Has a non-productive cough as well.     Referring provider: Rodrigo Ran, MD  HPI:   77 y.o. woman whom we are seeing for evaluation of asthma that is poorly controlled.  Multiple allergy and immunology notes reviewed.  Most recent pulmonary notes from 2016 reviewed.  Patient had worsening asthma control over the last several months.  Hallmarks of poor control over cough, dyspnea, wheeze.  She has tried multiple different inhalers.  Currently on Breztri.  This has provided some relief.  But still symptoms not adequately controlled per her report.  Recent ACT score 11/2022 reviewed 21, relatively good control.  Reviewed most recent CT chest 07/2022 that on my review interpretation reveals clear lungs bilaterally.  Reviewed most recent chest x-ray 11/2022 that on my review interpretation reveals clear lungs bilaterally.  Discussed role and rationale for biologic therapy in the setting of asthma given perceived poorly controlled disease.  Discussed she is on maximal medical or inhaled therapy.  Discussed risk and benefits of Biologics.  Discussed role and rationale for additional testing in effort to select most appropriate biologic therapy for her.  Questionaires / Pulmonary Flowsheets:   ACT:  Asthma Control Test ACT Total Score  11/16/2022  1:00 PM 21  09/14/2022  2:00 PM 15    MMRC:     No data to display          [facility]worth:      No data to display          Tests:   FENO:  No results found for: "NITRICOXIDE"  PFT:    Latest Ref Rng & Units 06/17/2014    1:01 PM  PFT Results  FVC-Pre L 1.92   FVC-Predicted Pre % 77   FVC-Post L 1.93   FVC-Predicted Post % 77   Pre FEV1/FVC % % 85   Post FEV1/FCV % % 87   FEV1-Pre L 1.63   FEV1-Predicted Pre %  87   FEV1-Post L 1.67   DLCO uncorrected ml/min/mmHg 15.02   DLCO UNC% % 79   DLVA Predicted % 103   TLC L 3.71   TLC % Predicted % 83   RV % Predicted % 67   Personally reviewed interpret as normal spirometry, lung volumes within normal limits, DLCO within normal limits  WALK:      No data to display          Imaging: Personally reviewed and as per EMR and discussion in this note CT Renal Stone Study  Result Date: 12/22/2022 CLINICAL DATA:  Flank pain, hematuria. EXAM: CT ABDOMEN AND PELVIS WITHOUT CONTRAST TECHNIQUE: Multidetector CT imaging of the abdomen and pelvis was performed following the standard protocol without IV contrast. RADIATION DOSE REDUCTION: This exam was performed according to the departmental dose-optimization program which includes automated exposure control, adjustment of the mA and/or kV according to patient size and/or use of iterative reconstruction technique. COMPARISON:  May 19, 2022. FINDINGS: Lower chest: No acute abnormality. Hepatobiliary: No focal liver abnormality is seen. No gallstones, gallbladder wall thickening, or biliary dilatation. Pancreas: Unremarkable. No pancreatic ductal dilatation or surrounding inflammatory changes. Spleen: Normal in size without focal abnormality. Adrenals/Urinary Tract: Adrenal glands are unremarkable. Kidneys are normal, without renal  calculi, focal lesion, or hydronephrosis. Bladder is unremarkable. Stomach/Bowel: Stomach is unremarkable. There is no evidence of bowel obstruction or inflammation. Vascular/Lymphatic: Aortic atherosclerosis. No enlarged abdominal or pelvic lymph nodes. Reproductive: Uterus and bilateral adnexa are unremarkable. Other: No abdominal wall hernia or abnormality. No abdominopelvic ascites. Musculoskeletal: No acute or significant osseous findings. IMPRESSION: No acute abnormality seen in the abdomen or pelvis. Aortic Atherosclerosis (ICD10-I70.0). Electronically Signed   By: Lupita Raider M.D.    On: 12/22/2022 10:37    Lab Results: Personally reviewed CBC    Component Value Date/Time   WBC 14.1 (H) 12/22/2022 0949   RBC 4.32 12/22/2022 0949   HGB 12.3 12/22/2022 0949   HGB 13.2 09/14/2022 1711   HCT 36.7 12/22/2022 0949   HCT 40.3 09/14/2022 1711   PLT 211 12/22/2022 0949   MCV 85.0 12/22/2022 0949   MCV 87 09/14/2022 1711   MCH 28.5 12/22/2022 0949   MCHC 33.5 12/22/2022 0949   RDW 14.0 12/22/2022 0949   RDW 12.6 09/14/2022 1711   LYMPHSABS 1.3 12/22/2022 0949   LYMPHSABS 1.9 09/14/2022 1711   MONOABS 0.9 12/22/2022 0949   EOSABS 0.2 12/22/2022 0949   EOSABS 0.2 09/14/2022 1711   BASOSABS 0.0 12/22/2022 0949   BASOSABS 0.1 09/14/2022 1711    BMET    Component Value Date/Time   NA 136 12/22/2022 0949   K 2.9 (L) 12/22/2022 0949   CL 99 12/22/2022 0949   CO2 32 12/22/2022 0949   GLUCOSE 96 12/22/2022 0949   BUN 30 (H) 12/22/2022 0949   CREATININE 1.05 (H) 12/22/2022 0949   CALCIUM 8.8 (L) 12/22/2022 0949   GFRNONAA 54 (L) 12/22/2022 0949   GFRAA >60 02/28/2019 1355    BNP No results found for: "BNP"  ProBNP    Component Value Date/Time   PROBNP 230.6 (H) 04/02/2014 1422    Specialty Problems       Pulmonary Problems   ALLERGIC RHINITIS    Qualifier: Diagnosis of  By: Marinus Maw   Replacing diagnoses that were inactivated after the 09/13/22 regulatory import      ASTHMA    Qualifier: Diagnosis of  By: Marinus Maw   Replacing diagnoses that were inactivated after the 09/13/22 regulatory import      Cough    Spirometry June 2009 wnl  MRI brain 06/2012 neg sinus dz  - Sinus CT 12/25/2014 1. Small amount of fluid layers in the maxillary sinuses and the left frontal sinus indicating mild sinusitis. 2. Compromise of the nasal airway by prominent terminates, and possibly nasal mucosal edema. rec augmentin x 10 days and ent f/u - added futter valve 01/02/15 with short course demerol to control         CAP (community  acquired pneumonia)    Sup segment RLL 04/08/14 > complegely resolved on f/u cxr 04/19/14       PNA (pneumonia)   Asthma with acute exacerbation   Dyspnea on exertion   Cough variant asthma    pfts wnl 06/17/14 including fef 2575 p BREO in am  - 12/19/2014 p extensive coaching HFA effectiveness =    75% > try dulera 100 2bid  - sinus CT 12/19/2014 1. Small amount of fluid layers in the maxillary sinuses and the left frontal sinus indicating mild sinusitis. 2. Compromise of the nasal airway by prominent terminates, and possibly nasal mucosal edema.> rx augmentin > referred to Ent 01/02/15 >>>      Sinusitis, chronic    See Sinus  CT 7/131/6  - refer to ENT 01/02/2015       Obstructive sleep apnea   OSA (obstructive sleep apnea)    Obstructive sleep apnea       Allergies  Allergen Reactions   Pollen Extract Other (See Comments)    Stuffy nose   Codeine Nausea And Vomiting    Immunization History  Administered Date(s) Administered   Influenza Split 02/12/2014   PFIZER(Purple Top)SARS-COV-2 Vaccination 07/27/2019, 08/19/2019   Pneumococcal-Unspecified 06/14/2012   Tdap 12/26/2010    Past Medical History:  Diagnosis Date   Anxiety    Arthritis    Asthma    Depression    Esophageal spasm    a. pt reports prior GI study demonstrating this.   GERD (gastroesophageal reflux disease)    Hypercholesteremia    Hypertension    Hypothyroid    Migraine    Osteoporosis    Pneumonia    PONV (postoperative nausea and vomiting)    Sinusitis    Sleep apnea    a. intolerant to CPAP.   Tachycardia    a. suspected due to sinus tach. Event monitor/echo unremarkable.    Tobacco History: Social History   Tobacco Use  Smoking Status Never   Passive exposure: Past  Smokeless Tobacco Never   Counseling given: Not Answered   Continue to not smoke  Outpatient Encounter Medications as of 01/20/2023  Medication Sig   acetaminophen (TYLENOL) 500 MG tablet Take 500 mg by mouth every  6 (six) hours as needed (for pain.).    albuterol (PROVENTIL) (2.5 MG/3ML) 0.083% nebulizer solution Mix one vial with budesonide every 4 hours as needed. Wean as directed.   albuterol (VENTOLIN HFA) 108 (90 Base) MCG/ACT inhaler Inhale 2 puffs into the lungs every 4 (four) hours as needed for wheezing or shortness of breath.   atorvastatin (LIPITOR) 40 MG tablet TAKE ONE TABLET BY MOUTH ONE TIME DAILY    azelastine (ASTELIN) 0.1 % nasal spray Place 2 sprays into both nostrils daily as needed for rhinitis. Use in each nostril as directed   BREZTRI AEROSPHERE 160-9-4.8 MCG/ACT AERO Inhale 2 puffs into the lungs in the morning and at bedtime.   budesonide (PULMICORT) 0.5 MG/2ML nebulizer solution Mix one vial with albuterol every 4 hours as needed. Wean as directed.   Carbinoxamine Maleate 4 MG TABS Take 1 tablet (4 mg total) by mouth every 8 (eight) hours as needed.   chlorthalidone (HYGROTON) 25 MG tablet Take 1 tablet (25 mg total) by mouth daily. (Patient taking differently: Take 12.5 mg by mouth daily.)   denosumab (PROLIA) 60 MG/ML SOSY injection Inject 60 mg into the skin every 6 (six) months.   ergocalciferol (VITAMIN D2) 50000 UNITS capsule Take 50,000 Units by mouth every Saturday.   fluorometholone (EFLONE) 0.1 % ophthalmic suspension Place 1 drop into both eyes daily as needed.   fluticasone (FLONASE) 50 MCG/ACT nasal spray Place 1-2 sprays into both nostrils daily as needed for allergies or rhinitis.   ipratropium (ATROVENT) 0.06 % nasal spray Place 2 sprays into both nostrils 3 (three) times daily as needed for rhinitis.   irbesartan (AVAPRO) 150 MG tablet Take 1 tablet (150 mg total) by mouth every morning.   levothyroxine (SYNTHROID, LEVOTHROID) 100 MCG tablet TAKE 1 TABLET BY MOUTH ONCE DAILY   LORazepam (ATIVAN) 0.5 MG tablet For insomnia.   meclizine (ANTIVERT) 25 MG tablet Take 1 tablet (25 mg total) by mouth 3 (three) times daily as needed for dizziness.   omeprazole (  PRILOSEC)  20 MG capsule Take 20 mg by mouth daily before breakfast.   ondansetron (ZOFRAN-ODT) 4 MG disintegrating tablet Take 1 tablet (4 mg total) by mouth every 8 (eight) hours as needed for nausea or vomiting.   Probiotic Product (ACIDOPHILUS/GOAT MILK) CAPS Take by mouth.   [DISCONTINUED] albuterol (VENTOLIN HFA) 108 (90 Base) MCG/ACT inhaler Inhale 2 puffs into the lungs every 6 (six) hours as needed for wheezing or shortness of breath.   [DISCONTINUED] cephALEXin (KEFLEX) 500 MG capsule Take 1 capsule (500 mg total) by mouth 2 (two) times daily.   [DISCONTINUED] doxycycline (MONODOX) 100 MG capsule Take 100 mg by mouth 2 (two) times daily.   [DISCONTINUED] HYDROcodone-acetaminophen (HYCET) 7.5-325 mg/15 ml solution Take 15 mLs by mouth 4 (four) times daily as needed for moderate pain.   [DISCONTINUED] ondansetron (ZOFRAN-ODT) 8 MG disintegrating tablet Take 8 mg by mouth every 8 (eight) hours as needed.   [DISCONTINUED] predniSONE (DELTASONE) 10 MG tablet Take 10 mg by mouth.   [DISCONTINUED] sucralfate (CARAFATE) 1 g tablet Take 1 tablet (1 g total) by mouth 4 (four) times daily -  with meals and at bedtime for 14 days.   Facility-Administered Encounter Medications as of 01/20/2023  Medication   lidocaine-EPINEPHrine (XYLOCAINE W/EPI) 1 %-1:100000 (with pres) injection 10 mL     Review of Systems  Review of Systems  No chest pain with exertion.  No orthopnea or PND.  Comprehensive review of systems otherwise negative. Physical Exam  BP 118/64   Pulse 67   Ht 5' (1.524 m)   Wt 109 lb 9.6 oz (49.7 kg)   SpO2 95% Comment: on RA  BMI 21.40 kg/m   Wt Readings from Last 5 Encounters:  01/20/23 109 lb 9.6 oz (49.7 kg)  12/22/22 111 lb (50.3 kg)  12/09/22 110 lb 3.7 oz (50 kg)  12/07/22 110 lb 6.4 oz (50.1 kg)  11/16/22 114 lb 1.6 oz (51.8 kg)    BMI Readings from Last 5 Encounters:  01/20/23 21.40 kg/m  12/22/22 21.68 kg/m  12/09/22 21.17 kg/m  12/07/22 21.56 kg/m  11/16/22 22.28  kg/m     Physical Exam General: Sitting in chair, no acute distress Eyes: EOMI, no icterus Neck: Supple, no JVP Pulmonary: Clear, normal work of breathing Cardiovascular: Warm, no edema Abdomen: Nondistended, bowel sounds present MSK: No synovitis, no joint effusion Neuro: Normal gait, no weakness Psych: Normal, full affect   Assessment & Plan:   Severe persistent asthma: Chronic problem. Last seen in pulmonary clinic 2016.  Followed by allergy and nausea recently.  On Breztri.  Mild improvement this year with frequent cough and dyspnea on exertion wheeze etc.  Overall poorly controlled.  Recent CT scan 07/2022 clear.  Recent eosinophils 200.  IgE and seasonal allergy panel today.  Suspect will need to institute biologic therapy but will wait for results of test before deciding on drug.   Return in about 2 months (around 03/22/2023) for f/u Dr. Judeth Horn, after PFT.   Karren Burly, MD 01/20/2023   This appointment required 61 minutes of patient care (this includes precharting, chart review, review of results, face-to-face care, etc.).

## 2023-01-27 ENCOUNTER — Other Ambulatory Visit: Payer: Self-pay

## 2023-01-27 ENCOUNTER — Encounter: Payer: Self-pay | Admitting: Allergy & Immunology

## 2023-01-27 ENCOUNTER — Ambulatory Visit: Payer: Medicare Other | Admitting: Allergy & Immunology

## 2023-01-27 ENCOUNTER — Telehealth: Payer: Self-pay

## 2023-01-27 VITALS — BP 122/60 | HR 68 | Temp 98.3°F | Resp 16 | Ht 60.5 in | Wt 109.4 lb

## 2023-01-27 DIAGNOSIS — J4540 Moderate persistent asthma, uncomplicated: Secondary | ICD-10-CM

## 2023-01-27 DIAGNOSIS — J3089 Other allergic rhinitis: Secondary | ICD-10-CM | POA: Diagnosis not present

## 2023-01-27 DIAGNOSIS — B999 Unspecified infectious disease: Secondary | ICD-10-CM

## 2023-01-27 MED ORDER — ALBUTEROL SULFATE (2.5 MG/3ML) 0.083% IN NEBU: 2.5000 mg | INHALATION_SOLUTION | RESPIRATORY_TRACT | 1 refills | Status: DC | PRN

## 2023-01-27 MED ORDER — ALBUTEROL SULFATE HFA 108 (90 BASE) MCG/ACT IN AERS: 2.0000 | INHALATION_SPRAY | RESPIRATORY_TRACT | 1 refills | Status: AC | PRN

## 2023-01-27 MED ORDER — BREZTRI AEROSPHERE 160-9-4.8 MCG/ACT IN AERO: 2.0000 | INHALATION_SPRAY | Freq: Two times a day (BID) | RESPIRATORY_TRACT | 3 refills | Status: DC

## 2023-01-27 MED ORDER — PANTOPRAZOLE SODIUM 20 MG PO TBEC: 20.0000 mg | DELAYED_RELEASE_TABLET | Freq: Every morning | ORAL | 1 refills | Status: DC

## 2023-01-27 NOTE — Progress Notes (Signed)
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 Moderate persistent asthma, uncomplicated - Lung testing looks good today. - I am glad you are making a recovery. - [place] and [place] form provided (bring that back). - Daily controller medication(s): Breztri two puffs twice daily with spacer  - Prior to physical activity: albuterol 2 puffs 10-15 minutes before physical activity. - Rescue medications: albuterol 4 puffs every 4-6 hours as needed - Asthma control goals:  * Full participation in all desired activities (may need albuterol before activity) * Albuterol use two time or less a week on average (not counting use with activity) * Cough interfering with sleep two time or less a month * Oral steroids no more than once a year * No hospitalizations  2. GERD - with current flare likely from worsening  - Continue with Protonix 20 mg once daily.  - I would call the GI office to see if they can make changes over the phone.   3. Return in about 6 months (around 07/30/2023).   Subjective:   Elizabeth Walker is a 77 y.o. female presenting today for follow up of  Chief Complaint  Patient presents with   Asthma    6 wk f/u - Pretty Good    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   Personal history of COVID-19 07/02/2021   Encounter for adjustment and management of other implanted nervous system device 07/22/2020   Nonspecific abnormal response to nerve stimulation 07/21/2020   Sleep disorder,  circadian, delayed sleep phase type 07/01/2020   Dizziness 10/03/2019   OSA (obstructive sleep apnea) 08/20/2019   Slow heart rate 05/24/2019   Obstructive sleep apnea 03/09/2019   Chronic insomnia 08/29/2017   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   Excessive daytime sleepiness 08/29/2017   Intolerance of continuous positive airway pressure (CPAP) ventilation 08/29/2017   Sinusitis, chronic 01/02/2015   Cough variant asthma 06/18/2014   Dyspnea on exertion 05/13/2014   Hypercholesteremia    Atypical chest pain 04/08/2014   Asthma with acute exacerbation 04/03/2014   PNA (pneumonia) 04/02/2014   Hypokalemia 04/02/2014   CAP (community acquired pneumonia) 04/02/2014   Hypothyroidism 04/10/2013   Tachycardia 04/22/2011   HYPERLIPIDEMIA 12/04/2007   Essential hypertension 12/04/2007   ALLERGIC RHINITIS 12/04/2007   ASTHMA 12/04/2007   Headache 12/04/2007   Cough 12/04/2007    History obtained from: chart review and patient.  Elizabeth Walker is a 77 y.o. female presenting for a follow up visit.  We last saw her in June 2024.  At that time, her lung testing was not done since she was so sick.  We did COVID testing that was negative.  We added on budesonide mixed with albuterol 4 times daily during the day for 1 week and then twice daily for a week and then stop.  We continue with doxycycline and added on Augmentin twice daily for 10 days.  We did recommend that she complete a prednisone taper.  She continued on Breztri 2 puffs twice daily and albuterol  as needed.  For her GERD, we recommended restarting Pepcid 20 mg twice daily and added on Carafate 1 g 3 times daily.  After the last visit, she got very sick, but now she is making some improvement. She was on budeonside and albuterol mixed together twice daily.    Asthma/Respiratory Symptom History: She remains on the Breztri two puffs twice daily. She has been doing well over the last week.  She is doing well overall.  She has not been on  albuterol.  She is feeling a lot better than she was previously.  She has not been to the emergency room since we last saw her.  She thinks that she is back to her baseline.  GERD Symptom History: She is no longer on Pepcid.  She was on omeprazole, but she had an endoscopy performed and her medication was changed. She had her endoscopy on July 2nd. She was placed on Protonix instead. She has not notice dan improvement. She is going to go back around February for a colonoscopy.   Otherwise, there have been no changes to her past medical history, surgical history, family history, or social history.    Review of systems otherwise negative other than that mentioned in the HPI.    Objective:   Blood pressure 122/60, pulse 68, temperature 98.3 F (36.8 C), temperature source Temporal, resp. rate 16, height 5' 0.5" (1.537 m), weight 109 lb 6.4 oz (49.6 kg), SpO2 100%. Body mass index is 21.01 kg/m.    Physical Exam Vitals reviewed.  Constitutional:      Appearance: She is well-developed.     Comments: She seems much more comfortable than the previous visit.  HENT:     Head: Normocephalic and atraumatic.     Right Ear: Tympanic membrane, ear canal and external ear normal. No drainage, swelling or tenderness. Tympanic membrane is not injected, scarred, erythematous, retracted or bulging.     Left Ear: Tympanic membrane, ear canal and external ear normal. No drainage, swelling or tenderness. Tympanic membrane is not injected, scarred, erythematous, retracted or bulging.     Nose: No nasal deformity, septal deviation, mucosal edema or rhinorrhea.     Right Turbinates: Enlarged. Not swollen or pale.     Left Turbinates: Enlarged. Not swollen or pale.     Right Sinus: No maxillary sinus tenderness or frontal sinus tenderness.     Left Sinus: No maxillary sinus tenderness or frontal sinus tenderness.     Comments: No nasal polyps.    Mouth/Throat:     Mouth: Mucous membranes are not pale and  not dry.     Pharynx: Uvula midline.  Eyes:     General:        Right eye: No discharge.        Left eye: No discharge.     Conjunctiva/sclera: Conjunctivae normal.     Right eye: Right conjunctiva is not injected. No chemosis.    Left eye: Left conjunctiva is not injected. No chemosis.    Pupils: Pupils are equal, round, and reactive to light.  Cardiovascular:     Rate and Rhythm: Normal rate and regular rhythm.     Heart sounds: Normal heart sounds.  Pulmonary:     Effort: Pulmonary effort is normal. No tachypnea, accessory muscle usage or respiratory distress.     Breath sounds: Examination of the right-lower field reveals wheezing. Examination of the left-lower field reveals wheezing. Wheezing present. No rhonchi or rales.     Comments: Faint wheezing  at the bases. Chest:     Chest wall: No tenderness.  Abdominal:     Tenderness: There is no abdominal tenderness. There is no guarding or rebound.  Lymphadenopathy:     Head:     Right side of head: No submandibular, tonsillar or occipital adenopathy.     Left side of head: No submandibular, tonsillar or occipital adenopathy.     Cervical: No cervical adenopathy.  Skin:    Coloration: Skin is not pale.     Findings: No abrasion, erythema, petechiae or rash. Rash is not papular, urticarial or vesicular.  Neurological:     Mental Status: She is alert.  Psychiatric:        Behavior: Behavior is cooperative.      Diagnostic studies:    Spirometry: results normal (FEV1: 1.19/70%, FVC: 1.73/78%, FEV1/FVC: 69%).    Spirometry consistent with normal pattern.    Allergy Studies: none        Malachi Bonds, MD  [facility]

## 2023-01-27 NOTE — Telephone Encounter (Signed)
AZ&ME forms have been given to patient to fill out patient portion. Patient plans to bring form back to the [place] office to be faxed off with the completed provider part of the forms. Dr. Dellis Anes signed both paper prescription and provider form. They have been placed in pending and will be faxed together once the patient part is brought back.

## 2023-01-27 NOTE — Patient Instructions (Addendum)
1. Moderate persistent asthma, uncomplicated - Lung testing looks good today. - I am glad you are making a recovery. - [place] and Me form provided (bring that back). - Daily controller medication(s): Breztri two puffs twice daily with spacer  - Prior to physical activity: albuterol 2 puffs 10-15 minutes before physical activity. - Rescue medications: albuterol 4 puffs every 4-6 hours as needed - Asthma control goals:  * Full participation in all desired activities (may need albuterol before activity) * Albuterol use two time or less a week on average (not counting use with activity) * Cough interfering with sleep two time or less a month * Oral steroids no more than once a year * No hospitalizations  2. GERD - with current flare likely from worsening  - Continue with Protonix 20 mg once daily.  - I would call the GI office to see if they can make changes over the phone.   3. Return in about 6 months (around 07/30/2023).    Please inform us of any Emergency Department visits, hospitalizations, or changes in symptoms. Call us before going to the ED for breathing or allergy symptoms since we might be able to fit you in for a sick visit. Feel free to contact us anytime with any questions, problems, or concerns.  It was a pleasure to see you again today!  Websites that have reliable patient information: 1. American Academy of Asthma, Allergy, and Immunology: www.aaaai.org 2. Food Allergy Research and Education (FARE): foodallergy.org 3. Mothers of Asthmatics: http://www.asthmacommunitynetwork.org 4. American College of Allergy, Asthma, and Immunology: www.acaai.org   COVID-19 Vaccine Information can be found at: [email or web address].[email or web address] For questions related to vaccine distribution or appointments, please email [email or web address] or call [phone].   We realize that you might be concerned about having an allergic reaction to the  COVID19 vaccines. To help with that concern, WE ARE OFFERING THE COVID19 VACCINES IN OUR OFFICE! Ask the front desk for dates!     "Like" [name] on Facebook and Instagram for our latest updates!      A healthy democracy works best when [employer] participate! Make sure you are registered to vote! If you have moved or changed any of your contact information, you will need to get this updated before voting!  In some cases, you MAY be able to register to vote online: [email or web address]

## 2023-01-28 ENCOUNTER — Encounter: Payer: Self-pay | Admitting: Allergy & Immunology

## 2023-01-30 NOTE — Progress Notes (Signed)
IgE and allergen panel within normal limits

## 2023-02-10 ENCOUNTER — Ambulatory Visit (INDEPENDENT_AMBULATORY_CARE_PROVIDER_SITE_OTHER): Payer: Medicare Other | Admitting: Pulmonary Disease

## 2023-02-10 DIAGNOSIS — J4550 Severe persistent asthma, uncomplicated: Secondary | ICD-10-CM | POA: Diagnosis not present

## 2023-02-10 LAB — PULMONARY FUNCTION TEST
DL/VA % pred: 113 %
DL/VA: 4.78 ml/min/mmHg/L
DLCO cor % pred: 77 %
DLCO cor: 12.76 ml/min/mmHg
DLCO unc % pred: 75 %
DLCO unc: 12.31 ml/min/mmHg
FEF 25-75 Post: 0.93 L/s
FEF 25-75 Pre: 0.58 L/s
FEF2575-%Change-Post: 60 %
FEF2575-%Pred-Post: 75 %
FEF2575-%Pred-Pre: 47 %
FEV1-%Change-Post: 21 %
FEV1-%Pred-Post: 79 %
FEV1-%Pred-Pre: 65 %
FEV1-Post: 1.28 L
FEV1-Pre: 1.05 L
FEV1FVC-%Change-Post: 3 %
FEV1FVC-%Pred-Pre: 91 %
FEV6-%Change-Post: 17 %
FEV6-%Pred-Post: 89 %
FEV6-%Pred-Pre: 75 %
FEV6-Post: 1.83 L
FEV6-Pre: 1.55 L
FEV6FVC-%Change-Post: 0 %
FEV6FVC-%Pred-Post: 106 %
FEV6FVC-%Pred-Pre: 106 %
FVC-%Change-Post: 17 %
FVC-%Pred-Post: 83 %
FVC-%Pred-Pre: 71 %
FVC-Post: 1.83 L
FVC-Pre: 1.56 L
Post FEV1/FVC ratio: 70 %
Post FEV6/FVC ratio: 100 %
Pre FEV1/FVC ratio: 67 %
Pre FEV6/FVC Ratio: 100 %
RV % pred: 72 %
RV: 1.57 L
TLC % pred: 73 %
TLC: 3.29 L

## 2023-02-10 NOTE — Progress Notes (Signed)
Full PFT performed today. °

## 2023-02-10 NOTE — Patient Instructions (Signed)
Full PFT performed today. °

## 2023-03-12 ENCOUNTER — Other Ambulatory Visit: Payer: Self-pay | Admitting: Cardiovascular Disease

## 2023-03-23 ENCOUNTER — Encounter: Payer: Self-pay | Admitting: Pulmonary Disease

## 2023-03-23 ENCOUNTER — Ambulatory Visit: Payer: Medicare Other | Admitting: Pulmonary Disease

## 2023-03-23 VITALS — BP 156/79 | HR 65 | Temp 97.7°F | Ht 60.5 in | Wt 111.0 lb

## 2023-03-23 DIAGNOSIS — J4550 Severe persistent asthma, uncomplicated: Secondary | ICD-10-CM

## 2023-03-23 NOTE — Patient Instructions (Signed)
No changes to medications  Return to clinic in 6 months or sooner as needed

## 2023-03-23 NOTE — Progress Notes (Signed)
@Patient  ID: Elizabeth Walker, female    DOB: [birth year], 77 y.o.   MRN: [id number]  Chief Complaint  Patient presents with   Follow-up    Breathing is much improved since the last visit. She would like a flu vaccine today.     Referring provider: Rodrigo Ran, MD  HPI:   77 y.o. woman whom we are seeing for evaluation of asthma that is poorly controlled.  Most recent allergy and immunology note reviewed.    Breztri prescribed given worsening symptoms.  Per most recent allergy immunology note symptoms are improving.  PFTs in the interim reveal normal spirometry, significant bronchodilator response in both FEV1 and FVC.  Lung volumes and DLCO okay.  She endorses marked improvement since last visit.  As well as she is felt in some time.  She is pleased with this.  Discussed continuing current medical management given improvement.  She agrees.  HPI at initial visit: Patient had worsening asthma control over the last several months.  Hallmarks of poor control over cough, dyspnea, wheeze.  She has tried multiple different inhalers.  Currently on Breztri.  This has provided some relief.  But still symptoms not adequately controlled per her report.  Recent ACT score 11/2022 reviewed 21, relatively good control.  Reviewed most recent CT chest 07/2022 that on my review interpretation reveals clear lungs bilaterally.  Reviewed most recent chest x-ray 11/2022 that on my review interpretation reveals clear lungs bilaterally.  Discussed role and rationale for biologic therapy in the setting of asthma given perceived poorly controlled disease.  Discussed she is on maximal medical or inhaled therapy.  Discussed risk and benefits of Biologics.  Discussed role and rationale for additional testing in effort to select most appropriate biologic therapy for her.  Questionaires / Pulmonary Flowsheets:   ACT:  Asthma Control Test ACT Total Score  11/16/2022  1:00 PM 21  09/14/2022  2:00 PM 15    MMRC:     No  data to display          Epworth:      No data to display          Tests:   FENO:  No results found for: "NITRICOXIDE"  PFT:    Latest Ref Rng & Units 02/10/2023   11:38 AM 06/17/2014    1:01 PM  PFT Results  FVC-Pre L 1.56  1.92   FVC-Predicted Pre % 71  77   FVC-Post L 1.83  1.93   FVC-Predicted Post % 83  77   Pre FEV1/FVC % % 67  85   Post FEV1/FCV % % 70  87   FEV1-Pre L 1.05  1.63   FEV1-Predicted Pre % 65  87   FEV1-Post L 1.28  1.67   DLCO uncorrected ml/min/mmHg 12.31  15.02   DLCO UNC% % 75  79   DLCO corrected ml/min/mmHg 12.76    DLCO COR %Predicted % 77    DLVA Predicted % 113  103   TLC L 3.29  3.71   TLC % Predicted % 73  83   RV % Predicted % 72  67   Personally reviewed interpret as normal spirometry, lung volumes within normal limits, DLCO within normal limits  WALK:      No data to display          Imaging: Personally reviewed and as per EMR and discussion in this note No results found.  Lab Results: Personally reviewed CBC    Component  Value Date/Time   WBC 14.1 (H) 12/22/2022 0949   RBC 4.32 12/22/2022 0949   HGB 12.3 12/22/2022 0949   HGB 13.2 09/14/2022 1711   HCT 36.7 12/22/2022 0949   HCT 40.3 09/14/2022 1711   PLT 211 12/22/2022 0949   MCV 85.0 12/22/2022 0949   MCV 87 09/14/2022 1711   MCH 28.5 12/22/2022 0949   MCHC 33.5 12/22/2022 0949   RDW 14.0 12/22/2022 0949   RDW 12.6 09/14/2022 1711   LYMPHSABS 1.3 12/22/2022 0949   LYMPHSABS 1.9 09/14/2022 1711   MONOABS 0.9 12/22/2022 0949   EOSABS 0.2 12/22/2022 0949   EOSABS 0.2 09/14/2022 1711   BASOSABS 0.0 12/22/2022 0949   BASOSABS 0.1 09/14/2022 1711    BMET    Component Value Date/Time   NA 136 12/22/2022 0949   K 2.9 (L) 12/22/2022 0949   CL 99 12/22/2022 0949   CO2 32 12/22/2022 0949   GLUCOSE 96 12/22/2022 0949   BUN 30 (H) 12/22/2022 0949   CREATININE 1.05 (H) 12/22/2022 0949   CALCIUM 8.8 (L) 12/22/2022 0949   GFRNONAA 54 (L) 12/22/2022 0949    GFRAA >60 02/28/2019 1355    BNP No results found for: "BNP"  ProBNP    Component Value Date/Time   PROBNP 230.6 (H) 04/02/2014 1422    Specialty Problems       Pulmonary Problems   ALLERGIC RHINITIS    Qualifier: Diagnosis of  By: Marinus Maw   Replacing diagnoses that were inactivated after the 09/13/22 regulatory import      ASTHMA    Qualifier: Diagnosis of  By: Marinus Maw   Replacing diagnoses that were inactivated after the 09/13/22 regulatory import      Cough    Spirometry June 2009 wnl  MRI brain 06/2012 neg sinus dz  - Sinus CT 12/25/2014 1. Small amount of fluid layers in the maxillary sinuses and the left frontal sinus indicating mild sinusitis. 2. Compromise of the nasal airway by prominent terminates, and possibly nasal mucosal edema. rec augmentin x 10 days and ent f/u - added futter valve 01/02/15 with short course demerol to control         CAP (community acquired pneumonia)    Sup segment RLL 04/08/14 > complegely resolved on f/u cxr 04/19/14       PNA (pneumonia)   Asthma with acute exacerbation   Dyspnea on exertion   Cough variant asthma    pfts wnl 06/17/14 including fef 2575 p BREO in am  - 12/19/2014 p extensive coaching HFA effectiveness =    75% > try dulera 100 2bid  - sinus CT 12/19/2014 1. Small amount of fluid layers in the maxillary sinuses and the left frontal sinus indicating mild sinusitis. 2. Compromise of the nasal airway by prominent terminates, and possibly nasal mucosal edema.> rx augmentin > referred to Ent 01/02/15 >>>      Sinusitis, chronic    See Sinus CT 7/131/6  - refer to ENT 01/02/2015       Obstructive sleep apnea   OSA (obstructive sleep apnea)    Obstructive sleep apnea       Allergies  Allergen Reactions   Ace Inhibitors Cough   Amoxicillin-Pot Clavulanate Diarrhea   Escitalopram Other (See Comments)   Mirtazapine Other (See Comments)   Pollen Extract Other (See Comments)    Stuffy nose    Sertraline Other (See Comments)   Suvorexant Other (See Comments)   Codeine Nausea And Vomiting    Immunization  History  Administered Date(s) Administered   Influenza Split 02/12/2014   PFIZER(Purple Top)SARS-COV-2 Vaccination 07/27/2019, 08/19/2019   Pneumococcal-Unspecified 06/14/2012   Tdap 12/26/2010    Past Medical History:  Diagnosis Date   Anxiety    Arthritis    Asthma    Depression    Esophageal spasm    a. pt reports prior GI study demonstrating this.   GERD (gastroesophageal reflux disease)    Hypercholesteremia    Hypertension    Hypothyroid    Migraine    Osteoporosis    Pneumonia    PONV (postoperative nausea and vomiting)    Sinusitis    Sleep apnea    a. intolerant to CPAP.   Tachycardia    a. suspected due to sinus tach. Event monitor/echo unremarkable.    Tobacco History: Social History   Tobacco Use  Smoking Status Never   Passive exposure: Past  Smokeless Tobacco Never   Counseling given: Not Answered   Continue to not smoke  Outpatient Encounter Medications as of 03/23/2023  Medication Sig   acetaminophen (TYLENOL) 500 MG tablet Take 500 mg by mouth every 6 (six) hours as needed (for pain.).    albuterol (PROVENTIL) (2.5 MG/3ML) 0.083% nebulizer solution Take 3 mLs (2.5 mg total) by nebulization every 4 (four) hours as needed for wheezing or shortness of breath. Mix one vial with budesonide every 4 hours as needed. Wean as directed.   albuterol (VENTOLIN HFA) 108 (90 Base) MCG/ACT inhaler Inhale 2 puffs into the lungs every 4 (four) hours as needed for wheezing or shortness of breath.   atorvastatin (LIPITOR) 40 MG tablet TAKE ONE TABLET BY MOUTH ONE TIME DAILY    azelastine (ASTELIN) 0.1 % nasal spray Place 2 sprays into both nostrils daily as needed for rhinitis. Use in each nostril as directed   Budeson-Glycopyrrol-Formoterol (BREZTRI AEROSPHERE) 160-9-4.8 MCG/ACT AERO Inhale 2 puffs into the lungs in the morning and at bedtime.    budesonide (PULMICORT) 0.5 MG/2ML nebulizer solution Mix one vial with albuterol every 4 hours as needed. Wean as directed.   Carbinoxamine Maleate 4 MG TABS Take 1 tablet (4 mg total) by mouth every 8 (eight) hours as needed.   denosumab (PROLIA) 60 MG/ML SOSY injection Inject 60 mg into the skin every 6 (six) months.   ergocalciferol (VITAMIN D2) 50000 UNITS capsule Take 50,000 Units by mouth every Saturday.   esomeprazole (NEXIUM) 40 MG capsule Take 40 mg by mouth daily.   fluorometholone (EFLONE) 0.1 % ophthalmic suspension Place 1 drop into both eyes daily as needed.   fluticasone (FLONASE) 50 MCG/ACT nasal spray Place 1-2 sprays into both nostrils daily as needed for allergies or rhinitis.   irbesartan (AVAPRO) 150 MG tablet TAKE 1 TABLET BY MOUTH ONCE DAILY IN THE MORNING   levothyroxine (SYNTHROID, LEVOTHROID) 100 MCG tablet TAKE 1 TABLET BY MOUTH ONCE DAILY   LORazepam (ATIVAN) 0.5 MG tablet For insomnia.   meclizine (ANTIVERT) 25 MG tablet Take 1 tablet (25 mg total) by mouth 3 (three) times daily as needed for dizziness.   ondansetron (ZOFRAN-ODT) 4 MG disintegrating tablet Take 1 tablet (4 mg total) by mouth every 8 (eight) hours as needed for nausea or vomiting.   Probiotic Product (ACIDOPHILUS/GOAT MILK) CAPS Take by mouth.   [DISCONTINUED] BREZTRI AEROSPHERE 160-9-4.8 MCG/ACT AERO Inhale 2 puffs into the lungs in the morning and at bedtime.   [DISCONTINUED] chlorthalidone (HYGROTON) 25 MG tablet Take 1 tablet (25 mg total) by mouth daily. (Patient taking differently: Take  12.5 mg by mouth daily.)   [DISCONTINUED] ipratropium (ATROVENT) 0.06 % nasal spray Place 2 sprays into both nostrils 3 (three) times daily as needed for rhinitis.   [DISCONTINUED] pantoprazole (PROTONIX) 20 MG tablet Take 1 tablet (20 mg total) by mouth every morning.   Facility-Administered Encounter Medications as of 03/23/2023  Medication   lidocaine-EPINEPHrine (XYLOCAINE W/EPI) 1 %-1:100000 (with pres)  injection 10 mL     Review of Systems  Review of Systems  N/a Physical Exam  BP (!) 156/79 (BP Location: Right Arm, Cuff Size: Normal)   Pulse 65   Temp 97.7 F (36.5 C) (Oral)   Ht 5' 0.5" (1.537 m)   Wt 111 lb (50.3 kg)   SpO2 98%   BMI 21.32 kg/m   Wt Readings from Last 5 Encounters:  03/23/23 111 lb (50.3 kg)  01/27/23 109 lb 6.4 oz (49.6 kg)  01/20/23 109 lb 9.6 oz (49.7 kg)  12/22/22 111 lb (50.3 kg)  12/09/22 110 lb 3.7 oz (50 kg)    BMI Readings from Last 5 Encounters:  03/23/23 21.32 kg/m  01/27/23 21.01 kg/m  01/20/23 21.40 kg/m  12/22/22 21.68 kg/m  12/09/22 21.17 kg/m     Physical Exam General: Sitting in chair, no acute distress Eyes: EOMI, no icterus Neck: Supple, no JVP Pulmonary: Clear, normal work of breathing Cardiovascular: Warm, no edema Abdomen: Nondistended, bowel sounds present MSK: No synovitis, no joint effusion Neuro: Normal gait, no weakness Psych: Normal, full affect   Assessment & Plan:   Severe persistent asthma: Chronic problem. Last seen in pulmonary clinic 2016.  Followed by allergy and nausea recently.  On Breztri.  Mild improvement this year with frequent cough and dyspnea on exertion wheeze etc.  Overall poorly controlled.  Recent CT scan 07/2022 clear.  Recent eosinophils 200.  IgE and seasonal allergy panel within normal limits.  Marked improvement in interim to continue [place].  Encouraged ongoing allergy follow-up.   Return in about 6 months (around 09/21/2023) for f/u Dr. Judeth Horn.   Karren Burly, MD 03/23/2023

## 2023-04-18 ENCOUNTER — Ambulatory Visit: Payer: Medicare Other | Admitting: Cardiovascular Disease

## 2023-05-31 ENCOUNTER — Other Ambulatory Visit: Payer: Self-pay | Admitting: Medical Genetics

## 2023-06-01 ENCOUNTER — Other Ambulatory Visit: Payer: Self-pay | Admitting: Internal Medicine

## 2023-06-01 DIAGNOSIS — Z1231 Encounter for screening mammogram for malignant neoplasm of breast: Secondary | ICD-10-CM

## 2023-06-27 DIAGNOSIS — Z1231 Encounter for screening mammogram for malignant neoplasm of breast: Secondary | ICD-10-CM

## 2023-06-28 ENCOUNTER — Other Ambulatory Visit: Payer: Self-pay

## 2023-06-28 ENCOUNTER — Ambulatory Visit: Payer: Medicare Other | Admitting: Allergy & Immunology

## 2023-06-28 ENCOUNTER — Encounter: Payer: Self-pay | Admitting: Allergy & Immunology

## 2023-06-28 VITALS — BP 130/60 | HR 74 | Temp 98.9°F | Resp 16 | Ht 60.5 in | Wt 113.5 lb

## 2023-06-28 DIAGNOSIS — J3089 Other allergic rhinitis: Secondary | ICD-10-CM

## 2023-06-28 DIAGNOSIS — U071 COVID-19: Secondary | ICD-10-CM | POA: Diagnosis not present

## 2023-06-28 DIAGNOSIS — B349 Viral infection, unspecified: Secondary | ICD-10-CM | POA: Diagnosis not present

## 2023-06-28 DIAGNOSIS — B999 Unspecified infectious disease: Secondary | ICD-10-CM

## 2023-06-28 DIAGNOSIS — J4540 Moderate persistent asthma, uncomplicated: Secondary | ICD-10-CM

## 2023-06-28 MED ORDER — MOLNUPIRAVIR 200 MG PO CAPS: 4.0000 | ORAL_CAPSULE | Freq: Two times a day (BID) | ORAL | 0 refills | Status: AC

## 2023-06-28 MED ORDER — ONDANSETRON 4 MG PO TBDP: 4.0000 mg | ORAL_TABLET | Freq: Three times a day (TID) | ORAL | 0 refills | Status: AC | PRN

## 2023-06-28 NOTE — Progress Notes (Signed)
 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 Moderate persistent asthma, uncomplicated - Lung testing not done since you were so sick.  - Daily controller medication(s): Breztri  two puffs twice daily with spacer  - Prior to physical activity: albuterol  2 puffs 10-15 minutes before physical activity. - Rescue medications: albuterol  4 puffs every 4-6 hours as needed - Asthma control goals:  * Full participation in all desired activities (may need albuterol  before activity) * Albuterol  use two time or less a week on average (not counting use with activity) * Cough interfering with sleep two time or less a month * Oral steroids no more than once a year * No hospitalizations  2. GERD - Continue with Protonix  20 mg once daily.  - I would call the GI office to see if they can make changes over the phone.   3. COVID-19 infection - Start molnupiravir  4 capsules twice daily for five days. - Start Zofran  4mg  every 8 hours as needed for nausea. - Call us  if you decide that you want to start prednisone .  - Try to keep isolated from your husband unmasked so that he does not pick this up.   4. Return in about 3 months (around 09/26/2023). You can have the follow up appointment with Dr. Iva or a Nurse Practicioner (our Nurse Practitioners are excellent and always have Physician oversight!).   Subjective:   Elizabeth Walker is a 78 y.o. female presenting today for follow up of  Chief Complaint  Patient presents with   Nasal Congestion   Cough    Producing green mucous.   Headache    States that she is sick  with fluctuating fever scratchy throat sever headache. Body aches and pain extremely fatigue.    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   Dizziness 10/03/2019   OSA (obstructive sleep apnea) 08/20/2019   Slow heart rate 05/24/2019   Obstructive sleep apnea 03/09/2019   Chronic insomnia 08/29/2017   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   Excessive daytime sleepiness 08/29/2017   Intolerance of continuous positive airway pressure (CPAP) ventilation 08/29/2017   Sinusitis, chronic 01/02/2015   Cough variant asthma 06/18/2014   Dyspnea on exertion 05/13/2014   Hypercholesteremia    Atypical chest pain 04/08/2014   Asthma with acute exacerbation 04/03/2014   PNA (pneumonia) 04/02/2014   Hypokalemia 04/02/2014   CAP (community acquired pneumonia) 04/02/2014   Hypothyroidism 04/10/2013   Tachycardia 04/22/2011   HYPERLIPIDEMIA 12/04/2007   Essential hypertension 12/04/2007   ALLERGIC RHINITIS 12/04/2007   ASTHMA 12/04/2007   Headache 12/04/2007   Cough 12/04/2007    History obtained from: chart review and patient.  Discussed the use of AI scribe software for clinical note transcription with the patient and/or guardian, who gave verbal consent to  proceed.  Elizabeth Walker is a 78 y.o. female presenting for a follow up visit.  We last saw her in August 2024.  At that time, we continued with Breztri  2 puffs twice daily and albuterol  as needed.  Her GERD  was not under good control.  We continue with her Protonix  and recommended that she talk to her GI doctor about changing the meds a bit.  In the interim, she has  done fairly well.   She does report that she started having signs of an illness over the week. She reports that the onset of symptoms started on Saturday. She describes a pattern of fever, reaching a peak of 102.8 degrees Fahrenheit, accompanied by chills and body aches. The patient has been bedridden since the onset of symptoms. She has tested positive for COVID-19 once, followed by two negative tests. She is not convinced that she has it since she has had it more severely in the past.   She reports sinus pain and pressure, but denies any significant ear pain. She has been experiencing decreased appetite and has been trying to maintain hydration, although this has been challenging due to feelings of nausea.  Her asthma, which has previously exacerbated during COVID-19 infections, has been managed with an inhaler and has not flared up significantly this time. She remains on the Breztri   two puffs BID. She gets this through [place] [facility]. She has not had any problems with the medication delivery.   In previous COVID-19 episodes, the patient was treated with Molnupiravir . She has also been on cholesterol medication, which may have influenced the choice of antiviral treatment. The patient's kidney function is reported to be normal. She has previously been prescribed prednisone , but expresses a preference to avoid it if possible.   Otherwise, there have been no changes to her past medical history, surgical history, family history, or social history.    Review of systems otherwise negative other than that mentioned in the HPI.    Objective:   Blood pressure 130/60, pulse 74, temperature 98.9 F (37.2 C), resp. rate 16, height 5' 0.5 (1.537 m), weight 113 lb 8 oz (51.5 kg), SpO2 97%. Body mass index is 21.8 kg/m.    Physical Exam Vitals reviewed.  Constitutional:      Appearance: She is well-developed.     Comments: She seems much more comfortable than the previous visit.  HENT:     Head:  Normocephalic and atraumatic.     Right Ear: Tympanic membrane, ear canal and external ear normal. No drainage, swelling or tenderness. Tympanic membrane is not injected, scarred, erythematous, retracted or bulging.     Left Ear: Tympanic membrane, ear canal and external ear normal. No drainage, swelling or tenderness. Tympanic membrane is not injected, scarred, erythematous, retracted or bulging.     Nose: No nasal deformity, septal deviation, mucosal edema or rhinorrhea.     Right Turbinates: Enlarged, swollen and pale.     Left Turbinates: Enlarged, swollen and pale.     Right Sinus: No maxillary sinus tenderness or frontal sinus tenderness.     Left Sinus: No maxillary sinus tenderness or frontal sinus tenderness.     Comments: No nasal polyps.    Mouth/Throat:     Mouth: Mucous membranes are not pale and not dry.     Pharynx: Uvula midline.  Eyes:     General:        Right eye: No discharge.        Left eye: No discharge.  Conjunctiva/sclera: Conjunctivae normal.     Right eye: Right conjunctiva is not injected. No chemosis.    Left eye: Left conjunctiva is not injected. No chemosis.    Pupils: Pupils are equal, round, and reactive to light.  Cardiovascular:     Rate and Rhythm: Normal rate and regular rhythm.     Heart sounds: Normal heart sounds.  Pulmonary:     Effort: Pulmonary effort is normal. No tachypnea, accessory muscle usage or respiratory distress.     Breath sounds: Examination of the right-lower field reveals wheezing. Examination of the left-lower field reveals wheezing. Wheezing present. No rhonchi or rales.     Comments: Faint wheezing at the bases. Chest:     Chest wall: No tenderness.  Abdominal:     Tenderness: There is no abdominal tenderness. There is no guarding or rebound.  Lymphadenopathy:     Head:     Right side of head: No submandibular, tonsillar or occipital adenopathy.     Left side of head: No submandibular, tonsillar or occipital adenopathy.      Cervical: No cervical adenopathy.  Skin:    Coloration: Skin is not pale.     Findings: No abrasion, erythema, petechiae or rash. Rash is not papular, urticarial or vesicular.  Neurological:     Mental Status: She is alert.  Psychiatric:        Behavior: Behavior is cooperative.      Diagnostic studies:   COVID swab: POSITIVE with adequate control       Marty Shaggy, MD  [facility] 

## 2023-06-28 NOTE — Patient Instructions (Addendum)
 1. Moderate persistent asthma, uncomplicated - Lung testing not done since you were so sick.  - Daily controller medication(s): Breztri  two puffs twice daily with spacer  - Prior to physical activity: albuterol  2 puffs 10-15 minutes before physical activity. - Rescue medications: albuterol  4 puffs every 4-6 hours as needed - Asthma control goals:  * Full participation in all desired activities (may need albuterol  before activity) * Albuterol  use two time or less a week on average (not counting use with activity) * Cough interfering with sleep two time or less a month * Oral steroids no more than once a year * No hospitalizations  2. GERD - Continue with Protonix  20 mg once daily.  - I would call the GI office to see if they can make changes over the phone.   3. COVID-19 infection - Start molnupiravir  4 capsules twice daily for five days. - Start Zofran  4mg  every 8 hours as needed for nausea. - Call us  if you decide that you want to start prednisone .  - Try to keep isolated from your husband unmasked so that he does not pick this up.   4. Return in about 3 months (around 09/26/2023). You can have the follow up appointment with Dr. Iva or a Nurse Practicioner (our Nurse Practitioners are excellent and always have Physician oversight!).    Please inform us  of any Emergency Department visits, hospitalizations, or changes in symptoms. Call us  before going to the ED for breathing or allergy  symptoms since we might be able to fit you in for a sick visit. Feel free to contact us  anytime with any questions, problems, or concerns.  It was a pleasure to see you again today!  Websites that have reliable patient information: 1. American Academy of Asthma, Allergy , and Immunology: www.aaaai.org 2. Food Film/video Editor and Education (FARE): foodallergy.org 3. Mothers of Asthmatics: http://www.asthmacommunitynetwork.org 4. American College of Allergy , Asthma, and Immunology:  www.acaai.org      "Like" us  on Facebook and Instagram for our latest updates!      A healthy democracy works best when [employer] participate! Make sure you are registered to vote! If you have moved or changed any of your contact information, you will need to get this updated before voting! Scan the QR codes below to learn more!

## 2023-07-07 ENCOUNTER — Ambulatory Visit
Admission: RE | Admit: 2023-07-07 | Discharge: 2023-07-07 | Disposition: A | Discharge status: Home / Self Care | Payer: Medicare Other | Source: Ambulatory Visit | Attending: Internal Medicine | Admitting: Internal Medicine

## 2023-07-07 DIAGNOSIS — Z1231 Encounter for screening mammogram for malignant neoplasm of breast: Secondary | ICD-10-CM

## 2023-07-26 ENCOUNTER — Ambulatory Visit: Payer: Medicare Other | Admitting: Allergy & Immunology

## 2023-07-28 ENCOUNTER — Ambulatory Visit: Payer: Medicare Other | Admitting: Allergy & Immunology

## 2023-08-24 ENCOUNTER — Ambulatory Visit: Payer: Medicare Other | Attending: Cardiovascular Disease | Admitting: Cardiovascular Disease

## 2023-08-24 ENCOUNTER — Encounter: Payer: Self-pay | Admitting: Cardiovascular Disease

## 2023-08-24 VITALS — BP 132/68 | HR 59 | Ht 60.0 in | Wt 113.8 lb

## 2023-08-24 DIAGNOSIS — I10 Essential (primary) hypertension: Secondary | ICD-10-CM

## 2023-08-24 DIAGNOSIS — R001 Bradycardia, unspecified: Secondary | ICD-10-CM

## 2023-08-24 DIAGNOSIS — R0789 Other chest pain: Secondary | ICD-10-CM | POA: Diagnosis not present

## 2023-08-24 NOTE — Assessment & Plan Note (Signed)
 History of hyperlipidemia on statin therapy with lipid profile performed 07/05/2022 revealing total cholesterol 161, LDL of 93 and HDL 51.  This is appropriate for coronary calcium score of 0.

## 2023-08-24 NOTE — Assessment & Plan Note (Signed)
 History of essential hypertension with blood pressure measured today 132/68.  She is on irbesartan.  Her Bystolic was discontinued secondary to bradycardia.

## 2023-08-24 NOTE — Progress Notes (Signed)
 08/24/2023 [name]   [birth year]  [id number]  Primary Physician Rodrigo Ran, MD Primary Cardiologist: Runell Gess MD Nicholes Calamity, [place]  HPI:  Elizabeth Walker is a 78 y.o.   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   She was referred by her PCP, Dr. Waynard Edwards for cardiovascular valuation because of bradycardia.  She has seen Dr. Johney Frame in the past 08/20/2011 for palpitations.    I last saw her in the office 08/27/2022.  Her cardiac risk factor profile is notable for treated hypertension hyperlipidemia.  Her father did die of a myocardial infarction at age 41 and her mother had bypass surgery and valve replacement.  History of bypass surgery as well.  She had a negative Myoview stress test back in 2015 and a recent coronary calcium score of 0.  She does complain of some shortness of breath but denies chest pain.  She has been on multiple antihypertensive medications but is intolerant to calcium channel blockers causing peripheral edema.  She has been on hydralazine as well.  Currently she is on Bystolic, Avapro and chlorthalidone.  Her heart rate runs in the 40s.  She complains of chronic fatigue.  She also has episodes of presyncope.   A 2D echo was performed that was essentially normal.  Event monitor was also placed that revealed episodes of sinus bradycardia.  Based on this I did decrease her Bystolic from 5 to 2.5 mg a day which has resulted in an increase in her heart rate up in the 60 range.  She continues to have episodic presyncope although this did not occur while she was wearing her 30-day event monitor.  She also complains of some dyspnea on exertion.  She has had atypical chest pain in the past and had a coronary calcium score performed 05/01/2019 which was 0.3.  She had a loop recorder implanted by Dr. Royann Shivers 10/24/2019 which has not shown any arrhythmias.  Episodes of dizziness have  resolved.  She no longer has atypical chest pain either.   Since I saw her in the office a year ago she has remained stable.  She is just getting over COVID this past January.  She has had a total of 5 episodes of COVID since the beginning of the COVID pandemic.  She denies chest pain or shortness of breath.  Her bradycardia has resolved with the discontinuation of Bystolic.   Current Meds  Medication Sig   acetaminophen (TYLENOL) 500 MG tablet Take 500 mg by mouth every 6 (six) hours as needed (for pain.).    albuterol (VENTOLIN HFA) 108 (90 Base) MCG/ACT inhaler Inhale 2 puffs into the lungs every 4 (four) hours as needed for wheezing or shortness of breath.   atorvastatin (LIPITOR) 40 MG tablet TAKE ONE TABLET BY MOUTH ONE TIME DAILY    azelastine (ASTELIN) 0.1 % nasal spray Place 2 sprays into both nostrils daily as needed for rhinitis. Use in each nostril as directed   Budeson-Glycopyrrol-Formoterol (BREZTRI AEROSPHERE) 160-9-4.8 MCG/ACT AERO Inhale 2 puffs into the lungs in the morning and at bedtime.   Carbinoxamine Maleate 4 MG TABS Take 1 tablet (4 mg total) by mouth every 8 (eight) hours as needed.   denosumab (PROLIA) 60 MG/ML SOSY injection Inject 60 mg into the skin every 6 (six) months.   ergocalciferol (VITAMIN D2) 50000 UNITS capsule Take 50,000 Units by mouth every Saturday.  esomeprazole (NEXIUM) 40 MG capsule Take 40 mg by mouth daily.   fluorometholone (EFLONE) 0.1 % ophthalmic suspension Place 1 drop into both eyes daily as needed.   fluticasone (FLONASE) 50 MCG/ACT nasal spray Place 1-2 sprays into both nostrils daily as needed for allergies or rhinitis.   irbesartan (AVAPRO) 150 MG tablet TAKE 1 TABLET BY MOUTH ONCE DAILY IN THE MORNING   levothyroxine (SYNTHROID, LEVOTHROID) 100 MCG tablet TAKE 1 TABLET BY MOUTH ONCE DAILY   LORazepam (ATIVAN) 0.5 MG tablet For insomnia.   meclizine (ANTIVERT) 25 MG tablet Take 1 tablet (25 mg total) by mouth 3 (three) times daily as  needed for dizziness.   ondansetron (ZOFRAN-ODT) 4 MG disintegrating tablet Take 1 tablet (4 mg total) by mouth every 8 (eight) hours as needed for nausea or vomiting.   Probiotic Product (ACIDOPHILUS/GOAT MILK) CAPS Take by mouth.   Current Facility-Administered Medications for the 08/24/23 encounter (Office Visit) with Runell Gess, MD  Medication   lidocaine-EPINEPHrine (XYLOCAINE W/EPI) 1 %-1:100000 (with pres) injection 10 mL     Allergies  Allergen Reactions   Ace Inhibitors Cough   Amoxicillin-Pot Clavulanate Diarrhea   Escitalopram Other (See Comments)   Mirtazapine Other (See Comments)   Pollen Extract Other (See Comments)    Stuffy nose   Sertraline Other (See Comments)   Suvorexant Other (See Comments)   Codeine Nausea And Vomiting    Social History   Socioeconomic History   Marital status: Married    Spouse name: Not on file   Number of children: Not on file   Years of education: Not on file   Highest education level: Not on file  Occupational History   Not on file  Tobacco Use   Smoking status: Never    Passive exposure: Past   Smokeless tobacco: Never  Vaping Use   Vaping status: Never Used  Substance and Sexual Activity   Alcohol use: Yes    Comment: rare wine   Drug use: No   Sexual activity: Not on file  Other Topics Concern   Not on file  Social History Narrative   Pt lives in [place] with spouse.  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   Social Drivers of Corporate investment banker Strain: Low Risk  (07/26/2023)   Received from [facility]   Overall Financial Resource Strain (CARDIA)    Difficulty of Paying Living Expenses: Not hard at all  Food Insecurity: Low Risk  (07/28/2023)   Received from [facility]   Hunger Vital Sign    Worried About Running Out of Food in the Last Year: Never true    Ran Out of Food in the Last Year: Never true  Transportation Needs: No Transportation Needs (07/28/2023)   Received from [facility]    In the past 12 months, has lack of reliable transportation kept you from medical appointments, meetings, work or from getting things needed for daily living? : No  Physical Activity: Insufficiently Active (07/26/2023)   Received from [facility]   Exercise Vital Sign    Days of Exercise per Week: 3 days    Minutes of Exercise per Session: 30 min  Stress: No Stress Concern Present (07/26/2023)   Received from [facility] - Occupational Stress Questionnaire    Feeling of Stress : Not at all  Social Connections: Moderately Integrated (07/26/2023)   Received from [facility]   Social Network  How would you rate your social network (family, work, friends)?: Adequate participation with social networks  Intimate Partner Violence: Not At Risk (07/26/2023)   Received from [facility]   HITS    Over the last 12 months how often did your partner physically hurt you?: Never    Over the last 12 months how often did your partner insult you or talk down to you?: Rarely    Over the last 12 months how often did your partner threaten you with physical harm?: Never    Over the last 12 months how often did your partner scream or curse at you?: Rarely     Review of Systems: General: negative for chills, fever, night sweats or weight changes.  Cardiovascular: negative for chest pain, dyspnea on exertion, edema, orthopnea, palpitations, paroxysmal nocturnal dyspnea or shortness of breath Dermatological: negative for rash Respiratory: negative for cough or wheezing Urologic: negative for hematuria Abdominal: negative for nausea, vomiting, diarrhea, bright red blood per rectum, melena, or hematemesis Neurologic: negative for visual changes, syncope, or dizziness All other systems reviewed and are otherwise negative except as noted above.    Blood pressure 132/68, pulse (!) 59, height 5' (1.524 m), weight 113 lb 12.8 oz (51.6 kg), SpO2  97%.  General appearance: alert and no distress Neck: no adenopathy, no carotid bruit, no JVD, supple, symmetrical, trachea midline, and thyroid not enlarged, symmetric, no tenderness/mass/nodules Lungs: clear to auscultation bilaterally Heart: regular rate and rhythm, S1, S2 normal, no murmur, click, rub or gallop Extremities: extremities normal, atraumatic, no cyanosis or edema Pulses: 2+ and symmetric Skin: Skin color, texture, turgor normal. No rashes or lesions Neurologic: Grossly normal  EKG EKG Interpretation Date/Time:  Wednesday August 24 2023 14:14:37 EDT Ventricular Rate:  59 PR Interval:  150 QRS Duration:  74 QT Interval:  432 QTC Calculation: 427 R Axis:   34  Text Interpretation: Sinus bradycardia Anteroseptal infarct , age undetermined When compared with ECG of 09-Dec-2022 14:57, PREVIOUS ECG IS PRESENT Confirmed by Nanetta Batty [phone]) on 08/24/2023 2:15:42 PM    ASSESSMENT AND PLAN:   HYPERLIPIDEMIA History of hyperlipidemia on statin therapy with lipid profile performed 07/05/2022 revealing total cholesterol 161, LDL of 93 and HDL 51.  This is appropriate for coronary calcium score of 0.  Essential hypertension History of essential hypertension with blood pressure measured today 132/68.  She is on irbesartan.  Her Bystolic was discontinued secondary to bradycardia.  Slow heart rate History of sinus bradycardia currently resolved after discontinuation of beta-blockers.     Runell Gess MD FACP,FACC,FAHA, [facility] 08/24/2023 2:22 PM

## 2023-08-24 NOTE — Patient Instructions (Addendum)
 Consider medication Instructions:  Your physician recommends that you continue on your current medications as directed. Please refer to the Current Medication list given to you today.  *If you need a refill on your cardiac medications before your next appointment, please call your pharmacy*  Follow-Up: At [facility], you and your health needs are our priority.  As part of our continuing mission to provide you with exceptional heart care, we have created designated Provider Care Teams.  These Care Teams include your primary Cardiologist (physician) and Advanced Practice Providers (APPs -  Physician Assistants and Nurse Practitioners) who all work together to provide you with the care you need, when you need it.   Your next appointment:   1 year(s)  Provider:   Dr. Allyson Sabal  Other Instructions

## 2023-08-24 NOTE — Assessment & Plan Note (Signed)
 History of sinus bradycardia currently resolved after discontinuation of beta-blockers.

## 2023-09-12 ENCOUNTER — Encounter: Payer: Self-pay | Admitting: Pulmonary Disease

## 2023-09-12 ENCOUNTER — Ambulatory Visit: Admitting: Pulmonary Disease

## 2023-09-12 VITALS — BP 150/72 | HR 69 | Ht 61.0 in

## 2023-09-12 DIAGNOSIS — J4550 Severe persistent asthma, uncomplicated: Secondary | ICD-10-CM

## 2023-09-12 MED ORDER — BREZTRI AEROSPHERE 160-9-4.8 MCG/ACT IN AERO: 2.0000 | INHALATION_SPRAY | Freq: Two times a day (BID) | RESPIRATORY_TRACT | 3 refills | Status: DC

## 2023-09-12 NOTE — Progress Notes (Signed)
 @Patient  ID: Elizabeth Walker, female    DOB: March 14, 1946, 78 y.o.   MRN: [id number]  Chief Complaint  Patient presents with   Follow-up    Pt states she had covid in 01/25 was coughing but has been better    Referring provider: Rodrigo Ran, MD  HPI:   78 y.o. woman whom we are seeing for evaluation of asthma that is poorly controlled.  Most recent allergy and immunology note reviewed.    Overall symptoms felt stable.  Got COVID 1/22 at home again.  Prolonged bronchitis.  Improved last week or so.  Much better.  She states she is getting episodes of flares every 2 to 3 months.  I was not aware of this being so frequent.  We discussed Biologics once again.  He is only 200, IgE not elevated.  We discussed Tezspire.  Paperwork today.  HPI at initial visit: Patient had worsening asthma control over the last several months.  Hallmarks of poor control over cough, dyspnea, wheeze.  She has tried multiple different inhalers.  Currently on Breztri.  This has provided some relief.  But still symptoms not adequately controlled per her report.  Recent ACT score 11/2022 reviewed 21, relatively good control.  Reviewed most recent CT chest 07/2022 that on my review interpretation reveals clear lungs bilaterally.  Reviewed most recent chest x-ray 11/2022 that on my review interpretation reveals clear lungs bilaterally.  Discussed role and rationale for biologic therapy in the setting of asthma given perceived poorly controlled disease.  Discussed she is on maximal medical or inhaled therapy.  Discussed risk and benefits of Biologics.  Discussed role and rationale for additional testing in effort to select most appropriate biologic therapy for her.  Questionaires / Pulmonary Flowsheets:   ACT:  Asthma Control Test ACT Total Score  11/16/2022  1:00 PM 21  09/14/2022  2:00 PM 15    MMRC:     No data to display          Epworth:      No data to display          Tests:   FENO:  No results  found for: "NITRICOXIDE"  PFT:    Latest Ref Rng & Units 02/10/2023   11:38 AM 06/17/2014    1:01 PM  PFT Results  FVC-Pre L 1.56  1.92   FVC-Predicted Pre % 71  77   FVC-Post L 1.83  1.93   FVC-Predicted Post % 83  77   Pre FEV1/FVC % % 67  85   Post FEV1/FCV % % 70  87   FEV1-Pre L 1.05  1.63   FEV1-Predicted Pre % 65  87   FEV1-Post L 1.28  1.67   DLCO uncorrected ml/min/mmHg 12.31  15.02   DLCO UNC% % 75  79   DLCO corrected ml/min/mmHg 12.76    DLCO COR %Predicted % 77    DLVA Predicted % 113  103   TLC L 3.29  3.71   TLC % Predicted % 73  83   RV % Predicted % 72  67   Personally reviewed interpret as normal spirometry, lung volumes within normal limits, DLCO within normal limits  WALK:      No data to display          Imaging: Personally reviewed and as per EMR and discussion in this note No results found.  Lab Results: Personally reviewed CBC    Component Value Date/Time   WBC 14.1 (H)  12/22/2022 0949   RBC 4.32 12/22/2022 0949   HGB 12.3 12/22/2022 0949   HGB 13.2 09/14/2022 1711   HCT 36.7 12/22/2022 0949   HCT 40.3 09/14/2022 1711   PLT 211 12/22/2022 0949   MCV 85.0 12/22/2022 0949   MCV 87 09/14/2022 1711   MCH 28.5 12/22/2022 0949   MCHC 33.5 12/22/2022 0949   RDW 14.0 12/22/2022 0949   RDW 12.6 09/14/2022 1711   LYMPHSABS 1.3 12/22/2022 0949   LYMPHSABS 1.9 09/14/2022 1711   MONOABS 0.9 12/22/2022 0949   EOSABS 0.2 12/22/2022 0949   EOSABS 0.2 09/14/2022 1711   BASOSABS 0.0 12/22/2022 0949   BASOSABS 0.1 09/14/2022 1711    BMET    Component Value Date/Time   NA 136 12/22/2022 0949   K 2.9 (L) 12/22/2022 0949   CL 99 12/22/2022 0949   CO2 32 12/22/2022 0949   GLUCOSE 96 12/22/2022 0949   BUN 30 (H) 12/22/2022 0949   CREATININE 1.05 (H) 12/22/2022 0949   CALCIUM 8.8 (L) 12/22/2022 0949   GFRNONAA 54 (L) 12/22/2022 0949   GFRAA >60 02/28/2019 1355    BNP No results found for: "BNP"  ProBNP    Component Value Date/Time    PROBNP 230.6 (H) 04/02/2014 1422    Specialty Problems       Pulmonary Problems   ALLERGIC RHINITIS   Qualifier: Diagnosis of  By: Marinus Maw   Replacing diagnoses that were inactivated after the 09/13/22 regulatory import      ASTHMA   Qualifier: Diagnosis of  By: Marinus Maw   Replacing diagnoses that were inactivated after the 09/13/22 regulatory import      Cough   Spirometry June 2009 wnl  MRI brain 06/2012 neg sinus dz  - Sinus CT 12/25/2014 1. Small amount of fluid layers in the maxillary sinuses and the left frontal sinus indicating mild sinusitis. 2. Compromise of the nasal airway by prominent terminates, and possibly nasal mucosal edema. rec augmentin x 10 days and ent f/u - added futter valve 01/02/15 with short course demerol to control         CAP (community acquired pneumonia)   Sup segment RLL 04/08/14 > complegely resolved on f/u cxr 04/19/14       PNA (pneumonia)   Asthma with acute exacerbation   Dyspnea on exertion   Cough variant asthma   pfts wnl 06/17/14 including fef 2575 p BREO in am  - 12/19/2014 p extensive coaching HFA effectiveness =    75% > try dulera 100 2bid  - sinus CT 12/19/2014 1. Small amount of fluid layers in the maxillary sinuses and the left frontal sinus indicating mild sinusitis. 2. Compromise of the nasal airway by prominent terminates, and possibly nasal mucosal edema.> rx augmentin > referred to Ent 01/02/15 >>>      Sinusitis, chronic   See Sinus CT 7/131/6  - refer to ENT 01/02/2015       Obstructive sleep apnea   OSA (obstructive sleep apnea)   Obstructive sleep apnea       Allergies  Allergen Reactions   Ace Inhibitors Cough   Amoxicillin-Pot Clavulanate Diarrhea   Escitalopram Other (See Comments)   Mirtazapine Other (See Comments)   Pollen Extract Other (See Comments)    Stuffy nose   Sertraline Other (See Comments)   Suvorexant Other (See Comments)   Codeine Nausea And Vomiting     Immunization History  Administered Date(s) Administered   Influenza Split 02/12/2014   PFIZER(Purple Top)SARS-COV-2  Vaccination 07/27/2019, 08/19/2019   Pneumococcal-Unspecified 06/14/2012   Tdap 12/26/2010    Past Medical History:  Diagnosis Date   Anxiety    Arthritis    Asthma    Depression    Esophageal spasm    a. pt reports prior GI study demonstrating this.   GERD (gastroesophageal reflux disease)    Hypercholesteremia    Hypertension    Hypothyroid    Migraine    Osteoporosis    Pneumonia    PONV (postoperative nausea and vomiting)    Sinusitis    Sleep apnea    a. intolerant to CPAP.   Tachycardia    a. suspected due to sinus tach. Event monitor/echo unremarkable.    Tobacco History: Social History   Tobacco Use  Smoking Status Never   Passive exposure: Past  Smokeless Tobacco Never   Counseling given: Not Answered   Continue to not smoke  Outpatient Encounter Medications as of 09/12/2023  Medication Sig   acetaminophen (TYLENOL) 500 MG tablet Take 500 mg by mouth every 6 (six) hours as needed (for pain.).    albuterol (VENTOLIN HFA) 108 (90 Base) MCG/ACT inhaler Inhale 2 puffs into the lungs every 4 (four) hours as needed for wheezing or shortness of breath.   atorvastatin (LIPITOR) 40 MG tablet TAKE ONE TABLET BY MOUTH ONE TIME DAILY    azelastine (ASTELIN) 0.1 % nasal spray Place 2 sprays into both nostrils daily as needed for rhinitis. Use in each nostril as directed   Carbinoxamine Maleate 4 MG TABS Take 1 tablet (4 mg total) by mouth every 8 (eight) hours as needed.   denosumab (PROLIA) 60 MG/ML SOSY injection Inject 60 mg into the skin every 6 (six) months.   ergocalciferol (VITAMIN D2) 50000 UNITS capsule Take 50,000 Units by mouth every Saturday.   esomeprazole (NEXIUM) 40 MG capsule Take 40 mg by mouth daily.   fluorometholone (EFLONE) 0.1 % ophthalmic suspension Place 1 drop into both eyes daily as needed.   fluticasone (FLONASE) 50  MCG/ACT nasal spray Place 1-2 sprays into both nostrils daily as needed for allergies or rhinitis.   irbesartan (AVAPRO) 150 MG tablet TAKE 1 TABLET BY MOUTH ONCE DAILY IN THE MORNING   levothyroxine (SYNTHROID, LEVOTHROID) 100 MCG tablet TAKE 1 TABLET BY MOUTH ONCE DAILY   LORazepam (ATIVAN) 0.5 MG tablet For insomnia.   meclizine (ANTIVERT) 25 MG tablet Take 1 tablet (25 mg total) by mouth 3 (three) times daily as needed for dizziness.   ondansetron (ZOFRAN-ODT) 4 MG disintegrating tablet Take 1 tablet (4 mg total) by mouth every 8 (eight) hours as needed for nausea or vomiting.   Probiotic Product (ACIDOPHILUS/GOAT MILK) CAPS Take by mouth.   [DISCONTINUED] Budeson-Glycopyrrol-Formoterol (BREZTRI AEROSPHERE) 160-9-4.8 MCG/ACT AERO Inhale 2 puffs into the lungs in the morning and at bedtime.   budeson-glycopyrrolate-formoterol (BREZTRI AEROSPHERE) 160-9-4.8 MCG/ACT AERO Inhale 2 puffs into the lungs in the morning and at bedtime.   Facility-Administered Encounter Medications as of 09/12/2023  Medication   lidocaine-EPINEPHrine (XYLOCAINE W/EPI) 1 %-1:100000 (with pres) injection 10 mL     Review of Systems  Review of Systems  N/a Physical Exam  BP (!) 150/72 (BP Location: Left Arm, Patient Position: Sitting, Cuff Size: Normal)   Pulse 69   Ht 5\' 1"  (1.549 m)   SpO2 94%   BMI 21.50 kg/m   Wt Readings from Last 5 Encounters:  08/24/23 113 lb 12.8 oz (51.6 kg)  06/28/23 113 lb 8 oz (51.5 kg)  03/23/23  111 lb (50.3 kg)  01/27/23 109 lb 6.4 oz (49.6 kg)  01/20/23 109 lb 9.6 oz (49.7 kg)    BMI Readings from Last 5 Encounters:  09/12/23 21.50 kg/m  08/24/23 22.23 kg/m  06/28/23 21.80 kg/m  03/23/23 21.32 kg/m  01/27/23 21.01 kg/m     Physical Exam General: Sitting in chair, no acute distress Eyes: EOMI, no icterus Neck: Supple, no JVP Pulmonary: Faint scattered wheeze left upper lung field otherwise clear, normal work of breathing Cardiovascular: Warm, no  edema Abdomen: Nondistended, bowel sounds present MSK: No synovitis, no joint effusion Neuro: Normal gait, no weakness Psych: Normal, full affect   Assessment & Plan:   Severe persistent asthma: Chronic problem. Last seen in pulmonary clinic 2016.  Followed by allergy and immunology.  On Breztri.  Increase frequency of exacerbations over the last year or 2.  Overall poorly controlled.  Most recent CT scan 07/2022 clear.  Most recent eosinophils 200.  IgE and seasonal allergy panel within normal limits.  Continue Breztri, escalate biologic therapy via 10 Spiriva given recurrent exacerbations.  Prolonged bronchitis related to COVID: At risk given her asthma.  Gradually is improved.  Symptoms not back to baseline.   Return in about 3 months (around 12/12/2023) for f/u Dr. Judeth Horn.   Karren Burly, MD 09/12/2023

## 2023-09-12 NOTE — Patient Instructions (Signed)
 Nice to see you again  Chief Executive Officer today for [employer], this is the injection to treat the asthma that we discussed  Return to clinic in 3 months or sooner as needed with Dr. Judeth Horn

## 2023-09-15 ENCOUNTER — Telehealth: Payer: Self-pay

## 2023-09-15 DIAGNOSIS — J4550 Severe persistent asthma, uncomplicated: Secondary | ICD-10-CM

## 2023-09-15 NOTE — Telephone Encounter (Signed)
 Received new start paperwork for Tezspire  Submitted a Prior Authorization request to [facility] for TEZSPIRE via CoverMyMeds. Will update once we receive a response.  Key: [id number]

## 2023-09-18 ENCOUNTER — Other Ambulatory Visit (HOSPITAL_COMMUNITY): Payer: Self-pay

## 2023-09-18 NOTE — Telephone Encounter (Signed)
 Received notification from [facility] regarding a prior authorization for TEZSPIRE. Authorization has been APPROVED from 09/15/2023 to 06/13/2024. Approval letter sent to scan center.  Per test claim, copay for 28 days supply is $1034.28  Authorization # [id number]  Will need to submit PAP

## 2023-10-01 ENCOUNTER — Other Ambulatory Visit: Payer: Self-pay | Admitting: Cardiovascular Disease

## 2023-10-12 ENCOUNTER — Other Ambulatory Visit (HOSPITAL_COMMUNITY): Payer: Self-pay

## 2023-10-12 NOTE — Telephone Encounter (Signed)
 Submission for PAP has been delayed due to only having received one of the TWO applications required for Tezspire. Missing application completed and supporting documents compiled.  Submitted Patient Assistance Application to Tezspire Together for [employer] along with provider portion, patient portion, PA, medication list, and insurance card copies. Will update patient when we receive a response.  Phone #: [phone] Fax #: [phone]

## 2023-10-18 ENCOUNTER — Telehealth: Payer: Self-pay

## 2023-10-18 NOTE — Telephone Encounter (Unsigned)
 Copied from CRM [id number]. Topic: Clinical - Prescription Issue >> Oct 18, 2023  9:52 AM Margarette Shawl wrote: Reason for CRM:   Myrla Asp, with [employer] [facility] is contacting clinic to speak with either [facility] or [facility]. She is faxing over the requested info today and did confirm fax number.   Requested call back  # [phone]

## 2023-10-21 MED ORDER — TEZSPIRE 210 MG/1.91ML ~~LOC~~ SOAJ
210.0000 mg | SUBCUTANEOUS | 0 refills | Status: DC
  Filled 2023-10-26: qty 1.91, 28d supply, fill #0

## 2023-10-21 NOTE — Telephone Encounter (Signed)
 Enrolled patient into asthma grant through [employer]:  ID: [id number] Eligibility start date: 07/22/2023 Group ID: [id number] Eligibility end date: 10/18/2024 RxBin ID: [id number] Assistance amount: $1500 PCN: [id number]  Patient can be scheduled for Tezspire new start. Rx sent to [facility] to be couriered to clinic  Geraldene Kleine, PharmD, MPH, BCPS, CPP Clinical Pharmacist (Rheumatology and Pulmonology)

## 2023-10-21 NOTE — Telephone Encounter (Signed)
 Enrolled patient into asthma grant. Will not need Tezspire PAP assistance

## 2023-10-24 ENCOUNTER — Ambulatory Visit: Payer: Medicare Other | Admitting: Obstetrics and Gynecology

## 2023-10-24 ENCOUNTER — Other Ambulatory Visit (HOSPITAL_COMMUNITY): Payer: Self-pay

## 2023-10-24 NOTE — Telephone Encounter (Signed)
 Attempted to call patient to inform her of [employer] approval and schedule Tezspire new start visit. Unable to reach her. LVM with clinic call back number.   Tolu Dicy Smigel, PharmD [facility] PGY-1

## 2023-10-26 ENCOUNTER — Other Ambulatory Visit: Payer: Self-pay

## 2023-10-26 ENCOUNTER — Other Ambulatory Visit (HOSPITAL_COMMUNITY): Payer: Self-pay

## 2023-10-26 NOTE — Progress Notes (Signed)
 Spoke with [facility]. Will fill on 5/14. New delivery date is 10/27/23.

## 2023-10-26 NOTE — Progress Notes (Signed)
 Specialty Pharmacy Initial Fill Coordination Note  Elizabeth Walker is a 78 y.o. female contacted today regarding initial fill of specialty medication(s) Tezepelumab-ekko Rosalind [place])   Patient requested Courier to Provider Office   Delivery date: 10/31/23   Verified address: [address] [place], [place] [address]   Medication will be filled on 10/27/23.   Patient is enrolled into grant and is aware of $0 copayment.

## 2023-10-26 NOTE — Patient Instructions (Signed)
 Your next Tezspire dose is due on 11/28/2023, 12/26/2023, and every 28 days thereafter  CONTINUE Breztri  and Ventolin   Your prescription will be shipped from [facility]. Their phone number is [phone]  Please- call to schedule shipment and confirm address. They will mail your medication to your home.  Your copay should be affordable. If you call the pharmacy and it is not affordable, please double-check that they are billing through your grant as secondary coverage. That grant information is:  ID: [id number] Group ID: [id number] RxBin ID: [id number] PCN: [id number]  You will need to be seen by your provider in 3 to 4 months to assess how [facility] is working for you. Please ensure you have a follow-up appointment scheduled. Call our clinic if you need to make this appointment.  Stay up to date on all routine vaccines: influenza, pneumonia, COVID19, Shingles  How to manage an injection site reaction: Remember the 5 C's: COUNTER - leave on the counter at least 30 minutes but up to overnight to bring medication to room temperature. This may help prevent stinging COLD - place something cold (like an ice gel pack or cold water  bottle) on the injection site just before cleansing with alcohol . This may help reduce pain CLARITIN - use Claritin (generic name is loratadine) for the first two weeks of treatment or the day of, the day before, and the day after injecting. This will help to minimize injection site reactions CORTISONE CREAM - apply if injection site is irritated and itching CALL ME - if injection site reaction is bigger than the size of your fist, looks infected, blisters, or if you develop hives

## 2023-10-26 NOTE — Progress Notes (Signed)
 HPI Patient presents today to [facility] to see pharmacy team for Tezspire  new start. Past medical history includes HTN, asthma, OSA, hypothyroidism, and HLD.  Respiratory Medications Current regimen: Breztri  and Ventolin    Patient reports no known adherence challenges  OBJECTIVE Allergies  Allergen Reactions   Ace Inhibitors Cough   Amoxicillin -Pot Clavulanate Diarrhea   Escitalopram Other (See Comments)   Mirtazapine Other (See Comments)   Pollen Extract Other (See Comments)    Stuffy nose   Sertraline Other (See Comments)   Suvorexant Other (See Comments)   Codeine  Nausea And Vomiting    Outpatient Encounter Medications as of 10/31/2023  Medication Sig   acetaminophen  (TYLENOL ) 500 MG tablet Take 500 mg by mouth every 6 (six) hours as needed (for pain.).    albuterol  (VENTOLIN  HFA) 108 (90 Base) MCG/ACT inhaler Inhale 2 puffs into the lungs every 4 (four) hours as needed for wheezing or shortness of breath.   atorvastatin  (LIPITOR) 40 MG tablet TAKE ONE TABLET BY MOUTH ONE TIME DAILY    azelastine  (ASTELIN ) 0.1 % nasal spray Place 2 sprays into both nostrils daily as needed for rhinitis. Use in each nostril as directed   budeson-glycopyrrolate-formoterol  (BREZTRI  AEROSPHERE) 160-9-4.8 MCG/ACT AERO Inhale 2 puffs into the lungs in the morning and at bedtime.   Carbinoxamine  Maleate 4 MG TABS Take 1 tablet (4 mg total) by mouth every 8 (eight) hours as needed.   denosumab (PROLIA) 60 MG/ML SOSY injection Inject 60 mg into the skin every 6 (six) months.   ergocalciferol  (VITAMIN D2) 50000 UNITS capsule Take 50,000 Units by mouth every Saturday.   esomeprazole (NEXIUM) 40 MG capsule Take 40 mg by mouth daily.   fluorometholone (EFLONE) 0.1 % ophthalmic suspension Place 1 drop into both eyes daily as needed.   fluticasone  (FLONASE ) 50 MCG/ACT nasal spray Place 1-2 sprays into both nostrils daily as needed for allergies or rhinitis.   irbesartan  (AVAPRO ) 150 MG tablet TAKE 1  TABLET BY MOUTH ONCE DAILY IN THE MORNING   levothyroxine  (SYNTHROID , LEVOTHROID) 100 MCG tablet TAKE 1 TABLET BY MOUTH ONCE DAILY   LORazepam  (ATIVAN ) 0.5 MG tablet For insomnia.   meclizine  (ANTIVERT ) 25 MG tablet Take 1 tablet (25 mg total) by mouth 3 (three) times daily as needed for dizziness.   ondansetron  (ZOFRAN -ODT) 4 MG disintegrating tablet Take 1 tablet (4 mg total) by mouth every 8 (eight) hours as needed for nausea or vomiting.   Probiotic Product (ACIDOPHILUS/GOAT MILK) CAPS Take by mouth.   Tezepelumab -ekko (TEZSPIRE ) 210 MG/1. SOAJ Inject 210 mg into the skin every 28 (twenty-eight) days. Courier to pulm: [address], [address], [place] [address]. Appt on 10/27/23   Facility-Administered Encounter Medications as of 10/31/2023  Medication   lidocaine -EPINEPHrine  (XYLOCAINE  W/EPI) 1 %-1:100000 (with pres) injection 10 mL     Immunization History  Administered Date(s) Administered   Influenza Split 02/12/2014   PFIZER(Purple Top)SARS-COV-2 Vaccination 07/27/2019, 08/19/2019   Pneumococcal-Unspecified 06/14/2012   Tdap 12/26/2010     PFTs    Latest Ref Rng & Units 02/10/2023   11:38 AM 06/17/2014    1:01 PM  PFT Results  FVC-Pre L 1.56  1.92   FVC-Predicted Pre % 71  77   FVC-Post L 1.83  1.93   FVC-Predicted Post % 83  77   Pre FEV1/FVC % % 67  85   Post FEV1/FCV % % 70  87   FEV1-Pre L 1.05  1.63   FEV1-Predicted Pre % 65  87   FEV1-Post L 1.28  1.67   DLCO uncorrected ml/min/mmHg 12.31  15.02   DLCO UNC% % 75  79   DLCO corrected ml/min/mmHg 12.76    DLCO COR %Predicted % 77    DLVA Predicted % 113  103   TLC L 3.29  3.71   TLC % Predicted % 73  83   RV % Predicted % 72  67      Eosinophils Most recent blood eosinophil count was 0.2 kcells/microL taken on 12/22/2022.   IgE: 4 on 01/20/2023   Assessment   Biologics training for tezepulumab (Tezspire )  Goals of therapy: Mechanism: human monoclonal IgG2? antibody that binds to TSLP. This  blocks TSLP from its effect on inflammation including reduce eosinophils, IgE, FeNO, IL-5, and IL-13. Mechanism is not definitively established. Reviewed that Tezspire  is add-on medication and patient must continue maintenance inhaler regimen. Response to therapy: may take 3-4 months to determine efficacy.  Side effects: injection site reaction (6-18%), antibody development (2%), arthralgia (4%), back pain (4%), pharyngitis (4%)  Dose: Tezspire  210 mg once every 4 weeks  Administration/Storage:  Reviewed administration sites of thigh or abdomen (at least 2-3 inches away from abdomen). Reviewed the upper arm is only appropriate if caregiver is administering injection  Do not shake pen/syringe as this could lead to product foaming or precipitation. Do not shake syringe as this could lead to product foaming or precipitation.  Access: Approval of Tezspire  through: insurance and grant Patient enrolled into copay card program to help with copay assistance.  Patient self-administered Tezspire  210mg /1.91 ml in left lower abdomen using sample Tezspire  210mg /1.91 ml Autoinjector pen NDC: 16109-604-54 Lot: [id number] Expiration: 01/11/2026  Patient monitored for 30 minutes for adverse reaction.  Patient tolerated Tezspire . Injection site noted. Patient denies itchiness and irritation at injection., No swelling or redness noted., and Reviewed injection site reaction management with patient verbally and printed information for review in AVS  Medication Reconciliation  A drug regimen assessment was performed, including review of allergies, interactions, disease-state management, dosing and immunization history. Medications were reviewed with the patient, including name, instructions, indication, goals of therapy, potential side effects, importance of adherence, and safe use.  Drug interaction(s): No significant drug interactions identified.    PLAN Continue Tezspire  210mg  SQ every 28 days.  Rx sent  to: [facility]: [phone] .   Continue maintenance asthma regimen of: Breztri  and Ventolin   All questions encouraged and answered.  Instructed patient to reach out with any further questions or concerns.  Thank you for allowing pharmacy to participate in this patient's care.   Tolu Scottie Metayer, PharmD [employer] PGY1

## 2023-10-26 NOTE — Telephone Encounter (Signed)
 Tezspire new start scheduled for 10/31/23

## 2023-10-31 ENCOUNTER — Ambulatory Visit: Admitting: Pharmacist

## 2023-10-31 ENCOUNTER — Other Ambulatory Visit: Payer: Self-pay

## 2023-10-31 DIAGNOSIS — J4550 Severe persistent asthma, uncomplicated: Secondary | ICD-10-CM

## 2023-10-31 DIAGNOSIS — Z7722 Contact with and (suspected) exposure to environmental tobacco smoke (acute) (chronic): Secondary | ICD-10-CM | POA: Diagnosis not present

## 2023-10-31 MED ORDER — TEZSPIRE 210 MG/1.91ML ~~LOC~~ SOAJ
210.0000 mg | SUBCUTANEOUS | 4 refills | Status: DC
  Filled 2023-10-31 – 2023-11-18 (×2): qty 1.91, 28d supply, fill #0
  Filled 2023-12-05: qty 1.91, 28d supply, fill #1
  Filled 2024-01-10: qty 1.91, 28d supply, fill #2
  Filled 2024-02-09: qty 1.91, 28d supply, fill #3
  Filled 2024-03-12: qty 1.91, 28d supply, fill #4

## 2023-11-01 NOTE — Progress Notes (Signed)
 Patient started Tezspire  in clinic on 10/31/23. Tolerated well. Continue Tezspire  210mg  SQ every 28 days. Continue maintenance asthma regimen of: Breztri  and Ventolin   Nikki Rusnak, PharmD, MPH, BCPS, CPP Clinical Pharmacist (Rheumatology and Pulmonology)

## 2023-11-16 ENCOUNTER — Other Ambulatory Visit: Payer: Self-pay

## 2023-11-18 ENCOUNTER — Other Ambulatory Visit: Payer: Self-pay

## 2023-11-18 NOTE — Progress Notes (Signed)
 Specialty Pharmacy Refill Coordination Note  Elizabeth Walker is a 78 y.o. female contacted today regarding refills of specialty medication(s) Tezepelumab -ekko (Tezspire )   Patient requested Delivery   Delivery date: 11/23/23   Verified address: [address] [place], [place] [address]   Medication will be filled on 11/22/23.     Clinical Intervention Note  Clinical Intervention Notes: Patient stated this would be her first injection at home and had questions regarding storage. Advised patient to open shipping box and place medication in refrigerator upon arrival. Also advised patient to put on the counter 30 min prior to injection and allow to come to room temperature. Reminded patient that she could look at instructions in the box for reference and/or go to manufacturer website for videos to review injection technique. Patient understood.   Clinical Intervention Outcomes: Improved therapy adherence; Improved therapy effectiveness   [facility] Specialty Pharmacist

## 2023-12-05 ENCOUNTER — Other Ambulatory Visit: Payer: Self-pay

## 2023-12-05 ENCOUNTER — Encounter (INDEPENDENT_AMBULATORY_CARE_PROVIDER_SITE_OTHER): Payer: Self-pay

## 2023-12-05 NOTE — Progress Notes (Signed)
 Specialty Pharmacy Refill Coordination Note  Elizabeth Walker is a 78 y.o. female contacted today regarding refills of specialty medication(s) Tezepelumab -ekko (Tezspire )   Patient requested Delivery   Delivery date: 12/20/23   Verified address: [address] [place], [place], [place] [address]   Medication will be filled on 12/19/23.

## 2023-12-13 ENCOUNTER — Ambulatory Visit: Admitting: Pulmonary Disease

## 2023-12-13 ENCOUNTER — Encounter: Payer: Self-pay | Admitting: Pulmonary Disease

## 2023-12-13 VITALS — BP 164/66 | HR 59 | Ht 60.5 in | Wt 111.0 lb

## 2023-12-13 DIAGNOSIS — J4550 Severe persistent asthma, uncomplicated: Secondary | ICD-10-CM | POA: Diagnosis not present

## 2023-12-13 NOTE — Patient Instructions (Signed)
 No change in the medication, glad you are doing well  Let me know if you end up taking prednisone   Return to clinic in 6 months or sooner as needed with Dr. Annella

## 2023-12-13 NOTE — Progress Notes (Signed)
 @Patient  ID: Elizabeth Walker, female    DOB: 11-07-1945, 78 y.o.   MRN: [id number]  No chief complaint on file.   Referring provider: Shayne Anes, MD  HPI:   78 y.o. woman whom we are seeing for evaluation of asthma that is poorly controlled.  Most recent allergy  and immunology note reviewed.    Returns routine follow-up.  Maintained on Breztri .  Started Tezspire  10/2023 after recurrent exacerbations.  Phenotyped and no actionable results.  Doing well.  No exacerbations.  Over the last week with the heat brings minimal heavier.  But not nearly as bad as it typically is when she gets asthma attack.  HPI at initial visit: Patient had worsening asthma control over the last several months.  Hallmarks of poor control over cough, dyspnea, wheeze.  She has tried multiple different inhalers.  Currently on Breztri .  This has provided some relief.  But still symptoms not adequately controlled per her report.  Recent ACT score 11/2022 reviewed 21, relatively good control.  Reviewed most recent CT chest 07/2022 that on my review interpretation reveals clear lungs bilaterally.  Reviewed most recent chest x-ray 11/2022 that on my review interpretation reveals clear lungs bilaterally.  Discussed role and rationale for biologic therapy in the setting of asthma given perceived poorly controlled disease.  Discussed she is on maximal medical or inhaled therapy.  Discussed risk and benefits of Biologics.  Discussed role and rationale for additional testing in effort to select most appropriate biologic therapy for her.  Questionaires / Pulmonary Flowsheets:   ACT:  Asthma Control Test ACT Total Score  12/13/2023  1:19 PM 25  11/16/2022  1:00 PM 21  09/14/2022  2:00 PM 15    MMRC:     No data to display          Epworth:      No data to display          Tests:   FENO:  No results found for: NITRICOXIDE  PFT:    Latest Ref Rng & Units 02/10/2023   11:38 AM 06/17/2014    1:01 PM  PFT  Results  FVC-Pre L 1.56  1.92   FVC-Predicted Pre % 71  77   FVC-Post L 1.83  1.93   FVC-Predicted Post % 83  77   Pre FEV1/FVC % % 67  85   Post FEV1/FCV % % 70  87   FEV1-Pre L 1.05  1.63   FEV1-Predicted Pre % 65  87   FEV1-Post L 1.28  1.67   DLCO uncorrected ml/min/mmHg 12.31  15.02   DLCO UNC% % 75  79   DLCO corrected ml/min/mmHg 12.76    DLCO COR %Predicted % 77    DLVA Predicted % 113  103   TLC L 3.29  3.71   TLC % Predicted % 73  83   RV % Predicted % 72  67   Personally reviewed interpret as normal spirometry, lung volumes within normal limits, DLCO within normal limits  WALK:      No data to display          Imaging: Personally reviewed and as per EMR and discussion in this note No results found.  Lab Results: Personally reviewed CBC    Component Value Date/Time   WBC 14.1 (H) 12/22/2022 0949   RBC 4.32 12/22/2022 0949   HGB 12.3 12/22/2022 0949   HGB 13.2 09/14/2022 1711   HCT 36.7 12/22/2022 0949   HCT 40.3 09/14/2022  1711   PLT 211 12/22/2022 0949   MCV 85.0 12/22/2022 0949   MCV 87 09/14/2022 1711   MCH 28.5 12/22/2022 0949   MCHC 33.5 12/22/2022 0949   RDW 14.0 12/22/2022 0949   RDW 12.6 09/14/2022 1711   LYMPHSABS 1.3 12/22/2022 0949   LYMPHSABS 1.9 09/14/2022 1711   MONOABS 0.9 12/22/2022 0949   EOSABS 0.2 12/22/2022 0949   EOSABS 0.2 09/14/2022 1711   BASOSABS 0.0 12/22/2022 0949   BASOSABS 0.1 09/14/2022 1711    BMET    Component Value Date/Time   NA 136 12/22/2022 0949   K 2.9 (L) 12/22/2022 0949   CL 99 12/22/2022 0949   CO2 32 12/22/2022 0949   GLUCOSE 96 12/22/2022 0949   BUN 30 (H) 12/22/2022 0949   CREATININE 1.05 (H) 12/22/2022 0949   CALCIUM  8.8 (L) 12/22/2022 0949   GFRNONAA 54 (L) 12/22/2022 0949   GFRAA >60 02/28/2019 1355    BNP No results found for: BNP  ProBNP    Component Value Date/Time   PROBNP 230.6 (H) 04/02/2014 1422    Specialty Problems       Pulmonary Problems   ALLERGIC RHINITIS    Qualifier: Diagnosis of  By: Clifford Norris   Replacing diagnoses that were inactivated after the 09/13/22 regulatory import      ASTHMA   Qualifier: Diagnosis of  By: Clifford Norris   Replacing diagnoses that were inactivated after the 09/13/22 regulatory import      Cough   Spirometry June 2009 wnl  MRI brain 06/2012 neg sinus dz  - Sinus CT 12/25/2014 1. Small amount of fluid layers in the maxillary sinuses and the left frontal sinus indicating mild sinusitis. 2. Compromise of the nasal airway by prominent terminates, and possibly nasal mucosal edema. rec augmentin  x 10 days and ent f/u - added futter valve 01/02/15 with short course demerol  to control         CAP (community acquired pneumonia)   Sup segment RLL 04/08/14 > complegely resolved on f/u cxr 04/19/14       PNA (pneumonia)   Asthma with acute exacerbation   Dyspnea on exertion   Cough variant asthma   pfts wnl 06/17/14 including fef 2575 p BREO in am  - 12/19/2014 p extensive coaching HFA effectiveness =    75% > try dulera  100 2bid  - sinus CT 12/19/2014 1. Small amount of fluid layers in the maxillary sinuses and the left frontal sinus indicating mild sinusitis. 2. Compromise of the nasal airway by prominent terminates, and possibly nasal mucosal edema.> rx augmentin  > referred to Ent 01/02/15 >>>      Sinusitis, chronic   See Sinus CT 7/131/6  - refer to ENT 01/02/2015       Obstructive sleep apnea   OSA (obstructive sleep apnea)   Obstructive sleep apnea       Allergies  Allergen Reactions   Ace Inhibitors Cough   Amoxicillin -Pot Clavulanate Diarrhea   Escitalopram Other (See Comments)   Mirtazapine Other (See Comments)   Pollen Extract Other (See Comments)    Stuffy nose   Sertraline Other (See Comments)   Suvorexant Other (See Comments)   Codeine  Nausea And Vomiting    Immunization History  Administered Date(s) Administered   Influenza Split 02/12/2014   PFIZER(Purple Top)SARS-COV-2  Vaccination 07/27/2019, 08/19/2019   Pneumococcal-Unspecified 06/14/2012   Tdap 12/26/2010    Past Medical History:  Diagnosis Date   Anxiety    Arthritis    Asthma  Depression    Esophageal spasm    a. pt reports prior GI study demonstrating this.   GERD (gastroesophageal reflux disease)    Hypercholesteremia    Hypertension    Hypothyroid    Migraine    Osteoporosis    Pneumonia    PONV (postoperative nausea and vomiting)    Sinusitis    Sleep apnea    a. intolerant to CPAP.   Tachycardia    a. suspected due to sinus tach. Event monitor/echo unremarkable.    Tobacco History: Social History   Tobacco Use  Smoking Status Never   Passive exposure: Past  Smokeless Tobacco Never   Counseling given: Not Answered   Continue to not smoke  Outpatient Encounter Medications as of 12/13/2023  Medication Sig   acetaminophen  (TYLENOL ) 500 MG tablet Take 500 mg by mouth every 6 (six) hours as needed (for pain.).    albuterol  (VENTOLIN  HFA) 108 (90 Base) MCG/ACT inhaler Inhale 2 puffs into the lungs every 4 (four) hours as needed for wheezing or shortness of breath.   atorvastatin  (LIPITOR) 40 MG tablet TAKE ONE TABLET BY MOUTH ONE TIME DAILY    azelastine  (ASTELIN ) 0.1 % nasal spray Place 2 sprays into both nostrils daily as needed for rhinitis. Use in each nostril as directed   budeson-glycopyrrolate-formoterol  (BREZTRI  AEROSPHERE) 160-9-4.8 MCG/ACT AERO Inhale 2 puffs into the lungs in the morning and at bedtime.   Carbinoxamine  Maleate 4 MG TABS Take 1 tablet (4 mg total) by mouth every 8 (eight) hours as needed.   denosumab (PROLIA) 60 MG/ML SOSY injection Inject 60 mg into the skin every 6 (six) months.   ergocalciferol  (VITAMIN D2) 50000 UNITS capsule Take 50,000 Units by mouth every Saturday.   esomeprazole (NEXIUM) 40 MG capsule Take 40 mg by mouth daily.   fluorometholone (EFLONE) 0.1 % ophthalmic suspension Place 1 drop into both eyes daily as needed.   fluticasone   (FLONASE ) 50 MCG/ACT nasal spray Place 1-2 sprays into both nostrils daily as needed for allergies or rhinitis.   irbesartan  (AVAPRO ) 150 MG tablet TAKE 1 TABLET BY MOUTH ONCE DAILY IN THE MORNING   levothyroxine  (SYNTHROID , LEVOTHROID) 100 MCG tablet TAKE 1 TABLET BY MOUTH ONCE DAILY   LORazepam  (ATIVAN ) 0.5 MG tablet For insomnia.   meclizine  (ANTIVERT ) 25 MG tablet Take 1 tablet (25 mg total) by mouth 3 (three) times daily as needed for dizziness.   ondansetron  (ZOFRAN -ODT) 4 MG disintegrating tablet Take 1 tablet (4 mg total) by mouth every 8 (eight) hours as needed for nausea or vomiting.   Probiotic Product (ACIDOPHILUS/GOAT MILK) CAPS Take by mouth.   Tezepelumab -ekko (TEZSPIRE ) 210 MG/1. SOAJ Inject 210 mg into the skin every 28 (twenty-eight) days.   Facility-Administered Encounter Medications as of 12/13/2023  Medication   lidocaine -EPINEPHrine  (XYLOCAINE  W/EPI) 1 %-1:100000 (with pres) injection 10 mL     Review of Systems  Review of Systems  N/a Physical Exam  BP (!) 164/66 (BP Location: Left Arm, Patient Position: Sitting, Cuff Size: Normal)   Pulse (!) 59   Ht 5' 0.5 (1.537 m)   Wt 111 lb (50.3 kg)   SpO2 100%   BMI 21.32 kg/m   Wt Readings from Last 5 Encounters:  12/13/23 111 lb (50.3 kg)  08/24/23 113 lb 12.8 oz (51.6 kg)  06/28/23 113 lb 8 oz (51.5 kg)  03/23/23 111 lb (50.3 kg)  01/27/23 109 lb 6.4 oz (49.6 kg)    BMI Readings from Last 5 Encounters:  12/13/23 21.32  kg/m  09/12/23 21.50 kg/m  08/24/23 22.23 kg/m  06/28/23 21.80 kg/m  03/23/23 21.32 kg/m     Physical Exam General: Sitting in chair, no acute distress Eyes: EOMI, no icterus Neck: Supple, no JVP Pulmonary: Faint scattered wheeze left upper lung field otherwise clear, normal work of breathing Cardiovascular: Warm, no edema Abdomen: Nondistended, bowel sounds present MSK: No synovitis, no joint effusion Neuro: Normal gait, no weakness Psych: Normal, full  affect   Assessment & Plan:   Severe persistent asthma: Chronic problem. Last seen in pulmonary clinic 2016.  Followed by allergy  and immunology.  On Breztri .  Increase frequency of exacerbations over the last year or 2.  Overall poorly controlled.  Most recent CT scan 07/2022 clear.  Most recent eosinophils 200.  IgE and seasonal allergy  panel within normal limits.  Continue Breztri , began test prior 5/25, to continue..  Prolonged bronchitis related to COVID: At risk given her asthma.  Gradually is improved.  Symptoms  back to baseline.   Return in about 6 months (around 06/14/2024) for f/u Dr. Annella.   Elizabeth JONELLE Annella, MD 12/13/2023

## 2024-01-04 NOTE — Addendum Note (Signed)
 Addended by: DAYNE SHERRY RAMAN on: 01/04/2024 09:59 AM   Modules accepted: Level of Service

## 2024-01-10 ENCOUNTER — Other Ambulatory Visit: Payer: Self-pay

## 2024-01-10 NOTE — Progress Notes (Signed)
 Specialty Pharmacy Refill Coordination Note  Elizabeth Walker is a 78 y.o. female contacted today regarding refills of specialty medication(s) Tezepelumab -ekko (Tezspire )   Patient requested Delivery   Delivery date: 01/20/24   Verified address: [address], [place], [place] [address]   Medication will be filled on 07.30.25.

## 2024-01-12 ENCOUNTER — Other Ambulatory Visit: Payer: Self-pay

## 2024-01-19 ENCOUNTER — Other Ambulatory Visit: Payer: Self-pay

## 2024-02-09 ENCOUNTER — Encounter (INDEPENDENT_AMBULATORY_CARE_PROVIDER_SITE_OTHER): Payer: Self-pay

## 2024-02-09 ENCOUNTER — Other Ambulatory Visit: Payer: Self-pay

## 2024-02-09 NOTE — Progress Notes (Signed)
 Specialty Pharmacy Refill Coordination Note  Elizabeth Walker is a 78 y.o. female contacted today regarding refills of specialty medication(s) Tezepelumab -ekko (Tezspire )   Patient requested (Patient-Rptd) Delivery   Delivery date: 02/21/24   Verified address: (Patient-Rptd) [address]   Medication will be filled on 02/20/24.

## 2024-02-20 ENCOUNTER — Other Ambulatory Visit: Payer: Self-pay

## 2024-03-12 ENCOUNTER — Other Ambulatory Visit: Payer: Self-pay

## 2024-03-12 ENCOUNTER — Encounter (INDEPENDENT_AMBULATORY_CARE_PROVIDER_SITE_OTHER): Payer: Self-pay

## 2024-03-12 ENCOUNTER — Other Ambulatory Visit: Payer: Self-pay | Admitting: Pharmacy Technician

## 2024-03-12 NOTE — Progress Notes (Signed)
 Specialty Pharmacy Refill Coordination Note  Elizabeth Walker is a 78 y.o. female contacted today regarding refills of specialty medication(s) Tezepelumab -ekko (Tezspire )   Patient requested (Patient-Rptd) Delivery   Delivery date: 03/20/24 Verified address: (Patient-Rptd) [address] [place], [place], [place] [address]   Medication will be filled on 03/19/24.

## 2024-04-09 ENCOUNTER — Other Ambulatory Visit: Payer: Self-pay | Admitting: Pulmonary Disease

## 2024-04-09 ENCOUNTER — Other Ambulatory Visit: Payer: Self-pay

## 2024-04-09 DIAGNOSIS — J4550 Severe persistent asthma, uncomplicated: Secondary | ICD-10-CM

## 2024-04-09 NOTE — Telephone Encounter (Signed)
 Pt requesting refill of specialty medication - routing to Rx team to advise.

## 2024-04-10 ENCOUNTER — Other Ambulatory Visit (HOSPITAL_COMMUNITY): Payer: Self-pay

## 2024-04-10 ENCOUNTER — Other Ambulatory Visit: Payer: Self-pay

## 2024-04-10 MED ORDER — TEZSPIRE 210 MG/1.91ML ~~LOC~~ SOAJ
210.0000 mg | SUBCUTANEOUS | 3 refills | Status: DC
  Filled 2024-04-10 – 2024-04-11 (×2): qty 1.91, 28d supply, fill #0
  Filled 2024-05-08: qty 1.91, 28d supply, fill #1
  Filled 2024-06-11: qty 1.91, 28d supply, fill #2
  Filled 2024-07-05 – 2024-07-18 (×3): qty 1.91, 28d supply, fill #3

## 2024-04-10 NOTE — Telephone Encounter (Signed)
 Refill sent for TEZSPIRE  to [facility]: [phone]   Dose: 210mg  [place] every 28 days   Last OV: 12/13/23 Provider: Dr. Annella  Next OV: Due Jan 2026, not yet scheduled  Routing to scheduling team for follow-up on appt scheduling  Aleck Puls, PharmD, BCPS Clinical Pharmacist  [facility]

## 2024-04-11 ENCOUNTER — Other Ambulatory Visit (HOSPITAL_COMMUNITY): Payer: Self-pay

## 2024-04-11 ENCOUNTER — Other Ambulatory Visit: Payer: Self-pay

## 2024-04-11 NOTE — Progress Notes (Signed)
 Specialty Pharmacy Refill Coordination Note  Elizabeth Walker is a 78 y.o. female contacted today regarding refills of specialty medication(s) Tezepelumab -ekko (Tezspire )   Patient requested Delivery   Delivery date: 04/18/24   Verified address: [address] [place], [place], [place] [address]   Medication will be filled on: 04/17/24

## 2024-04-11 NOTE — Telephone Encounter (Signed)
 Patient scheduled.

## 2024-04-12 ENCOUNTER — Other Ambulatory Visit: Payer: Self-pay | Admitting: Medical Genetics

## 2024-04-12 DIAGNOSIS — Z006 Encounter for examination for normal comparison and control in clinical research program: Secondary | ICD-10-CM

## 2024-04-17 ENCOUNTER — Other Ambulatory Visit: Payer: Self-pay

## 2024-05-08 ENCOUNTER — Other Ambulatory Visit: Payer: Self-pay

## 2024-05-11 ENCOUNTER — Other Ambulatory Visit: Payer: Self-pay

## 2024-05-11 NOTE — Progress Notes (Signed)
 Specialty Pharmacy Refill Coordination Note  Elizabeth Walker is a 78 y.o. female contacted today regarding refills of specialty medication(s) Tezepelumab -ekko (Tezspire )   Patient requested Delivery   Delivery date: 05/22/24   Verified address: [address] [place], [place], [place] [address]   Medication will be filled on: 05/21/24

## 2024-05-21 ENCOUNTER — Other Ambulatory Visit: Payer: Self-pay

## 2024-06-02 ENCOUNTER — Other Ambulatory Visit: Payer: Self-pay

## 2024-06-02 ENCOUNTER — Encounter (HOSPITAL_BASED_OUTPATIENT_CLINIC_OR_DEPARTMENT_OTHER): Payer: Self-pay

## 2024-06-02 ENCOUNTER — Emergency Department (HOSPITAL_BASED_OUTPATIENT_CLINIC_OR_DEPARTMENT_OTHER)
Admission: EM | Admit: 2024-06-02 | Discharge: 2024-06-02 | Disposition: A | Discharge status: Home / Self Care | Attending: Emergency Medicine | Admitting: Emergency Medicine

## 2024-06-02 DIAGNOSIS — R509 Fever, unspecified: Secondary | ICD-10-CM | POA: Diagnosis present

## 2024-06-02 DIAGNOSIS — J111 Influenza due to unidentified influenza virus with other respiratory manifestations: Secondary | ICD-10-CM | POA: Insufficient documentation

## 2024-06-02 LAB — RESP PANEL BY RT-PCR (RSV, FLU A&B, COVID)  RVPGX2
Influenza A by PCR: POSITIVE — AB
Influenza B by PCR: NEGATIVE
Resp Syncytial Virus by PCR: NEGATIVE
SARS Coronavirus 2 by RT PCR: NEGATIVE

## 2024-06-02 MED ORDER — HYDROCODONE BIT-HOMATROP MBR 5-1.5 MG/5ML PO SOLN: 5.0000 mL | Freq: Four times a day (QID) | ORAL | 0 refills | Status: AC | PRN

## 2024-06-02 MED ORDER — OSELTAMIVIR PHOSPHATE 75 MG PO CAPS: 75.0000 mg | ORAL_CAPSULE | Freq: Two times a day (BID) | ORAL | 0 refills | Status: AC

## 2024-06-02 MED ORDER — ALBUTEROL SULFATE HFA 108 (90 BASE) MCG/ACT IN AERS
2.0000 | INHALATION_SPRAY | Freq: Once | RESPIRATORY_TRACT | Status: AC
  Administered 2024-06-02: 2 via RESPIRATORY_TRACT
  Filled 2024-06-02: qty 6.7

## 2024-06-02 NOTE — Discharge Instructions (Signed)
 Continue to rest and stay hydrated.  I have sent in Tamiflu  for you to start today.  Take as directed.  I have also sent in Hydromet for your cough.  Remember that this is a controlled substance and to take only as needed.  Do not drive while taking this medication.  Please have somebody with you when you take this to watch for any shortness of breath.  If your symptoms start to worsen or you have any chest pain or shortness of breath return to the ER.

## 2024-06-02 NOTE — ED Provider Notes (Signed)
 " [place] [facility]GENC[facility] DEPARTMENT AT [facility] Provider Note   CSN: [id number] Arrival date & time: 06/02/24  1544     Patient presents with: Fever   AVIELA BLUNDELL is a 78 y.o. female.    Fever  78 year old female presenting today with fever and cough.  Patient states that this started yesterday morning when she felt a fever starting to come on.  Since then she has had some coughing sneezing congestion.  She reports that she has been around somebody that was recently diagnosed with the COVID.  Patient denies any chest pain or shortness of breath.  Patient denies any abdominal pain.  Denies any constipation diarrhea nausea or vomiting.     Prior to Admission medications  Medication Sig Start Date End Date Taking? Authorizing Provider  HYDROcodone  bit-homatropine (HYDROMET) 5-1.5 MG/5ML syrup Take 5 mLs by mouth every 6 (six) hours as needed for cough. 06/02/24  Yes Rosaline Almarie MATSU, PA-C  oseltamivir  (TAMIFLU ) 75 MG capsule Take 1 capsule (75 mg total) by mouth every 12 (twelve) hours. 06/02/24  Yes Rosaline Almarie MATSU, PA-C  acetaminophen  (TYLENOL ) 500 MG tablet Take 500 mg by mouth every 6 (six) hours as needed (for pain.).     [provider]  albuterol  (VENTOLIN  HFA) 108 (90 Base) MCG/ACT inhaler Inhale 2 puffs into the lungs every 4 (four) hours as needed for wheezing or shortness of breath. 01/27/23   Iva Marty Saltness, MD  atorvastatin  (LIPITOR) 40 MG tablet TAKE ONE TABLET BY MOUTH ONE TIME DAILY  04/09/14   Von Pacific, MD  azelastine  (ASTELIN ) 0.1 % nasal spray Place 2 sprays into both nostrils daily as needed for rhinitis. Use in each nostril as directed    [provider]  budeson-glycopyrrolate-formoterol  (BREZTRI  AEROSPHERE) 160-9-4.8 MCG/ACT AERO Inhale 2 puffs into the lungs in the morning and at bedtime. 09/12/23   Hunsucker, Donnice SAUNDERS, MD  denosumab (PROLIA) 60 MG/ML SOSY injection Inject 60 mg into the skin every 6 (six) months.     [provider]  ergocalciferol  (VITAMIN D2) 50000 UNITS capsule Take 50,000 Units by mouth every Saturday.    [provider]  esomeprazole (NEXIUM) 40 MG capsule Take 40 mg by mouth daily. 02/18/23   [provider]  fluorometholone (EFLONE) 0.1 % ophthalmic suspension Place 1 drop into both eyes daily as needed.    [provider]  fluticasone  (FLONASE ) 50 MCG/ACT nasal spray Place 1-2 sprays into both nostrils daily as needed for allergies or rhinitis.    [provider]  irbesartan  (AVAPRO ) 150 MG tablet TAKE 1 TABLET BY MOUTH ONCE DAILY IN THE MORNING 10/04/23   Court Dorn PARAS, MD  levothyroxine  (SYNTHROID , LEVOTHROID) 100 MCG tablet TAKE 1 TABLET BY MOUTH ONCE DAILY 07/15/17   Von Pacific, MD  LORazepam  (ATIVAN ) 0.5 MG tablet For insomnia. 01/25/22   Dohmeier, Dedra, MD  meclizine  (ANTIVERT ) 25 MG tablet Take 1 tablet (25 mg total) by mouth 3 (three) times daily as needed for dizziness. 06/03/18   Dean Clarity, MD  ondansetron  (ZOFRAN -ODT) 4 MG disintegrating tablet Take 1 tablet (4 mg total) by mouth every 8 (eight) hours as needed for nausea or vomiting. 06/28/23   Iva Marty Saltness, MD  Probiotic Product (ACIDOPHILUS/GOAT MILK) CAPS Take by mouth.    [provider]  Tezepelumab -ekko (TEZSPIRE ) 210 MG/1. SOAJ Inject 210 mg into the skin every 28 (twenty-eight) days. 04/10/24   Hunsucker, Donnice SAUNDERS, MD    Allergies: Ace inhibitors, Amoxicillin -pot clavulanate, Escitalopram,  Mirtazapine, Pollen extract, Sertraline, Suvorexant, and Codeine     Review of Systems  Constitutional:  Positive for fever.  All other systems reviewed and are negative.   Updated Vital Signs BP (!) 176/69   Pulse 75   Temp 98.6 F (37 C)   Resp 20   Ht 5' (1.524 m)   Wt 49 kg   SpO2 98%   BMI 21.09 kg/m   Physical Exam Vitals and nursing note reviewed.  HENT:     Mouth/Throat:     Mouth: Mucous membranes are moist.     Pharynx: Oropharynx  is clear. Posterior oropharyngeal erythema present.  Cardiovascular:     Rate and Rhythm: Normal rate and regular rhythm.     Pulses: Normal pulses.     Heart sounds: Normal heart sounds.  Pulmonary:     Effort: Pulmonary effort is normal.     Breath sounds: Wheezing (And bases) present.  Skin:    General: Skin is warm and dry.  Neurological:     General: No focal deficit present.     Mental Status: She is alert.     (all labs ordered are listed, but only abnormal results are displayed) Labs Reviewed  RESP PANEL BY RT-PCR (RSV, FLU A&B, COVID)  [id number] - Abnormal; Notable for the following components:      Result Value   Influenza A by PCR POSITIVE (*)    All other components within normal limits    EKG: None  Radiology: No results found.   Procedures   Medications Ordered in the ED  albuterol  (VENTOLIN  HFA) 108 (90 Base) MCG/ACT inhaler 2 puff (2 puffs Inhalation Given 06/02/24 1701)                                    Medical Decision Making Risk Prescription drug management.   Impression: 78 year old female presenting today with fever.  Original diagnosis include COVID, flu, RSV, URI  Additional History: Patient was able to give history.  I reviewed past notes.  Labs: Respiratory panel showed positive for flu A.  Imaging: None  ED Course/Meds: Patient remained stable while in the ED.  Wheezing was noted in lower lungs therefore albuterol  was given.  I discussed the benefits and risk with taking Tamiflu  and patient stated that she would like to go ahead and take that.  Patient states that the only thing that helps her with her cough is Hydromet.  I discussed the risk and benefits including addiction, decrease respiratory drive, drowsiness and she verbalized that she understood.  She also states that her husband would be with her while she took it.  Patient was informed that if she starts to develop any chest pain or shortness of breath to return to the ER.       Final diagnoses:  Flu    ED Discharge Orders          Ordered    oseltamivir  (TAMIFLU ) 75 MG capsule  Every 12 hours        06/02/24 1708    HYDROcodone  bit-homatropine (HYDROMET) 5-1.5 MG/5ML syrup  Every 6 hours PRN        06/02/24 1708               Rosaline Almarie MATSU, PA-C 06/02/24 1714  "

## 2024-06-02 NOTE — ED Triage Notes (Signed)
 Pt reports fever starting yesterday. Pt reports fever returns after Tylenol  wears off. Pt reports not getting flu shot. Pt reports recent COVID exposure.

## 2024-06-11 ENCOUNTER — Other Ambulatory Visit (HOSPITAL_COMMUNITY): Payer: Self-pay

## 2024-06-13 ENCOUNTER — Other Ambulatory Visit: Payer: Self-pay

## 2024-06-13 NOTE — Progress Notes (Signed)
 Specialty Pharmacy Refill Coordination Note  Elizabeth Walker is a 78 y.o. female contacted today regarding refills of specialty medication(s) Tezepelumab -ekko (Tezspire )   Patient requested Delivery   Delivery date: 06/19/24   Verified address: [address] [place], [place], [place] [address]   Medication will be filled on: 06/18/24

## 2024-06-18 ENCOUNTER — Other Ambulatory Visit: Payer: Self-pay

## 2024-06-25 ENCOUNTER — Encounter: Payer: Self-pay | Admitting: *Deleted

## 2024-07-02 ENCOUNTER — Ambulatory Visit: Admitting: Pulmonary Disease

## 2024-07-04 ENCOUNTER — Encounter: Payer: Self-pay | Admitting: Pulmonary Disease

## 2024-07-04 ENCOUNTER — Ambulatory Visit: Admitting: Pulmonary Disease

## 2024-07-04 VITALS — BP 106/80 | HR 74 | Temp 98.1°F | Ht 60.5 in | Wt 110.4 lb

## 2024-07-04 DIAGNOSIS — J4550 Severe persistent asthma, uncomplicated: Secondary | ICD-10-CM

## 2024-07-04 DIAGNOSIS — J40 Bronchitis, not specified as acute or chronic: Secondary | ICD-10-CM | POA: Diagnosis not present

## 2024-07-04 DIAGNOSIS — Z8709 Personal history of other diseases of the respiratory system: Secondary | ICD-10-CM

## 2024-07-04 MED ORDER — PREDNISONE 20 MG PO TABS: ORAL_TABLET | ORAL | 0 refills | Status: AC

## 2024-07-04 NOTE — Patient Instructions (Signed)
 Nice to see you again  Continue Breztri , continue Tezspire   Prednisone  sent to help knock out the congestion, I am glad you are improving from the flu  Return to clinic in 6 months or sooner as needed with Dr. Annella

## 2024-07-04 NOTE — Progress Notes (Signed)
 "  @Patient  ID: Elizabeth Walker, female    DOB: [birth year], 79 y.o.   MRN: [id number]  Chief Complaint  Patient presents with   Asthma    Pt states since LOV breathing has been good SOB occasionally Pt had flu recently Dec 19th, which caused Dry hackling cough     Referring provider: Shayne Anes, MD  HPI:   79 y.o. woman whom we are seeing for evaluation of asthma.  Recent ED note reviewed.  Multiple notes from our clinic via telephone encounters in the interim reviewed.  Returns routine follow-up.  Maintained on Breztri .  Started Tezspire  10/2023 after recurrent exacerbations.  Phenotyped and no actionable results.  Doing well.  Really no exacerbations with the exception of contracting the flu the week before Christmas.  Took nearly a month to recover.  Received steroid injection end of December to aid in recovery.  She is gradually getting her energy back but feels still quite wiped out.  Cough and congestion much better.  Unfortunately last few days some congestion with yellow phlegm has come back.  Her breathing remained stable.  No wheezing.  HPI at initial visit: Patient had worsening asthma control over the last several months.  Hallmarks of poor control over cough, dyspnea, wheeze.  She has tried multiple different inhalers.  Currently on Breztri .  This has provided some relief.  But still symptoms not adequately controlled per her report.  Recent ACT score 11/2022 reviewed 21, relatively good control.  Reviewed most recent CT chest 07/2022 that on my review interpretation reveals clear lungs bilaterally.  Reviewed most recent chest x-ray 11/2022 that on my review interpretation reveals clear lungs bilaterally.  Discussed role and rationale for biologic therapy in the setting of asthma given perceived poorly controlled disease.  Discussed she is on maximal medical or inhaled therapy.  Discussed risk and benefits of Biologics.  Discussed role and rationale for additional testing in effort  to select most appropriate biologic therapy for her.  Questionaires / Pulmonary Flowsheets:   ACT:  Asthma Control Test ACT Total Score  07/04/2024  3:12 PM 19  12/13/2023  1:19 PM 25  11/16/2022  1:00 PM 21    MMRC:     No data to display          Epworth:      No data to display          Tests:   FENO:  No results found for: NITRICOXIDE  PFT:    Latest Ref Rng & Units 02/10/2023   11:38 AM 06/17/2014    1:01 PM  PFT Results  FVC-Pre L 1.56  1.92   FVC-Predicted Pre % 71  77   FVC-Post L 1.83  1.93   FVC-Predicted Post % 83  77   Pre FEV1/FVC % % 67  85   Post FEV1/FCV % % 70  87   FEV1-Pre L 1.05  1.63   FEV1-Predicted Pre % 65  87   FEV1-Post L 1.28  1.67   DLCO uncorrected ml/min/mmHg 12.31  15.02   DLCO UNC% % 75  79   DLCO corrected ml/min/mmHg 12.76    DLCO COR %Predicted % 77    DLVA Predicted % 113  103   TLC L 3.29  3.71   TLC % Predicted % 73  83   RV % Predicted % 72  67   Personally reviewed interpret as normal spirometry, lung volumes within normal limits, DLCO within normal limits  WALK:  No data to display          Imaging: Personally reviewed and as per EMR and discussion in this note No results found.  Lab Results: Personally reviewed CBC    Component Value Date/Time   WBC 14.1 (H) 12/22/2022 0949   RBC 4.32 12/22/2022 0949   HGB 12.3 12/22/2022 0949   HGB 13.2 09/14/2022 1711   HCT 36.7 12/22/2022 0949   HCT 40.3 09/14/2022 1711   PLT 211 12/22/2022 0949   MCV 85.0 12/22/2022 0949   MCV 87 09/14/2022 1711   MCH 28.5 12/22/2022 0949   MCHC 33.5 12/22/2022 0949   RDW 14.0 12/22/2022 0949   RDW 12.6 09/14/2022 1711   LYMPHSABS 1.3 12/22/2022 0949   LYMPHSABS 1.9 09/14/2022 1711   MONOABS 0.9 12/22/2022 0949   EOSABS 0.2 12/22/2022 0949   EOSABS 0.2 09/14/2022 1711   BASOSABS 0.0 12/22/2022 0949   BASOSABS 0.1 09/14/2022 1711    BMET    Component Value Date/Time   NA 136 12/22/2022 0949   K 2.9 (L)  12/22/2022 0949   CL 99 12/22/2022 0949   CO2 32 12/22/2022 0949   GLUCOSE 96 12/22/2022 0949   BUN 30 (H) 12/22/2022 0949   CREATININE 1.05 (H) 12/22/2022 0949   CALCIUM  8.8 (L) 12/22/2022 0949   GFRNONAA 54 (L) 12/22/2022 0949   GFRAA >60 02/28/2019 1355    BNP No results found for: BNP  ProBNP    Component Value Date/Time   PROBNP 230.6 (H) 04/02/2014 1422    Specialty Problems       Pulmonary Problems   ALLERGIC RHINITIS   Qualifier: Diagnosis of  By: Clifford Norris   Replacing diagnoses that were inactivated after the 09/13/22 regulatory import      ASTHMA   Qualifier: Diagnosis of  By: Clifford Norris   Replacing diagnoses that were inactivated after the 09/13/22 regulatory import      Cough   Spirometry June 2009 wnl  MRI brain 06/2012 neg sinus dz  - Sinus CT 12/25/2014 1. Small amount of fluid layers in the maxillary sinuses and the left frontal sinus indicating mild sinusitis. 2. Compromise of the nasal airway by prominent terminates, and possibly nasal mucosal edema. rec augmentin  x 10 days and ent f/u - added futter valve 01/02/15 with short course demerol  to control         CAP (community acquired pneumonia)   Sup segment RLL 04/08/14 > complegely resolved on f/u cxr 04/19/14       PNA (pneumonia)   Asthma with acute exacerbation   Dyspnea on exertion   Cough variant asthma   pfts wnl 06/17/14 including fef 2575 p BREO in am  - 12/19/2014 p extensive coaching HFA effectiveness =    75% > try dulera  100 2bid  - sinus CT 12/19/2014 1. Small amount of fluid layers in the maxillary sinuses and the left frontal sinus indicating mild sinusitis. 2. Compromise of the nasal airway by prominent terminates, and possibly nasal mucosal edema.> rx augmentin  > referred to Ent 01/02/15 >>>      Sinusitis, chronic   See Sinus CT 7/131/6  - refer to ENT 01/02/2015       Hypersomnia with sleep apnea   Obstructive sleep apnea   OSA (obstructive sleep  apnea)   Obstructive sleep apnea       Allergies  Allergen Reactions   Ace Inhibitors Cough   Amoxicillin -Pot Clavulanate Diarrhea   Escitalopram Other (See Comments)   Mirtazapine  Other (See Comments)   Pollen Extract Other (See Comments)    Stuffy nose   Sertraline Other (See Comments)   Suvorexant Other (See Comments)   Codeine  Nausea And Vomiting    Immunization History  Administered Date(s) Administered   Influenza Split 02/12/2014   PFIZER(Purple Top)SARS-COV-2 Vaccination 07/27/2019, 08/19/2019   Pneumococcal-Unspecified 06/14/2012   Tdap 12/26/2010    Past Medical History:  Diagnosis Date   Anxiety    Arthritis    Asthma    Depression    Esophageal spasm    a. pt reports prior GI study demonstrating this.   GERD (gastroesophageal reflux disease)    Hypercholesteremia    Hypertension    Hypothyroid    Migraine    Osteoporosis    Pneumonia    PONV (postoperative nausea and vomiting)    Sinusitis    Sleep apnea    a. intolerant to CPAP.   Tachycardia    a. suspected due to sinus tach. Event monitor/echo unremarkable.    Tobacco History: Social History   Tobacco Use  Smoking Status Never   Passive exposure: Past  Smokeless Tobacco Never   Counseling given: Not Answered   Continue to not smoke  Outpatient Encounter Medications as of 07/04/2024  Medication Sig   acetaminophen  (TYLENOL ) 500 MG tablet Take 500 mg by mouth every 6 (six) hours as needed (for pain.).    albuterol  (VENTOLIN  HFA) 108 (90 Base) MCG/ACT inhaler Inhale 2 puffs into the lungs every 4 (four) hours as needed for wheezing or shortness of breath.   atorvastatin  (LIPITOR) 40 MG tablet TAKE ONE TABLET BY MOUTH ONE TIME DAILY    azelastine  (ASTELIN ) 0.1 % nasal spray Place 2 sprays into both nostrils daily as needed for rhinitis. Use in each nostril as directed   budeson-glycopyrrolate-formoterol  (BREZTRI  AEROSPHERE) 160-9-4.8 MCG/ACT AERO Inhale 2 puffs into the lungs in the  morning and at bedtime.   denosumab (PROLIA) 60 MG/ML SOSY injection Inject 60 mg into the skin every 6 (six) months.   ergocalciferol  (VITAMIN D2) 50000 UNITS capsule Take 50,000 Units by mouth every Saturday.   esomeprazole (NEXIUM) 40 MG capsule Take 40 mg by mouth daily.   fluorometholone (EFLONE) 0.1 % ophthalmic suspension Place 1 drop into both eyes daily as needed.   irbesartan  (AVAPRO ) 150 MG tablet TAKE 1 TABLET BY MOUTH ONCE DAILY IN THE MORNING   levothyroxine  (SYNTHROID , LEVOTHROID) 100 MCG tablet TAKE 1 TABLET BY MOUTH ONCE DAILY   LORazepam  (ATIVAN ) 0.5 MG tablet For insomnia.   meclizine  (ANTIVERT ) 25 MG tablet Take 1 tablet (25 mg total) by mouth 3 (three) times daily as needed for dizziness.   ondansetron  (ZOFRAN -ODT) 4 MG disintegrating tablet Take 1 tablet (4 mg total) by mouth every 8 (eight) hours as needed for nausea or vomiting.   predniSONE  (DELTASONE ) 20 MG tablet Take 2 tablets (40 mg total) by mouth daily with breakfast for 5 days, THEN 1 tablet (20 mg total) daily with breakfast for 5 days.   Probiotic Product (ACIDOPHILUS/GOAT MILK) CAPS Take by mouth.   Tezepelumab -ekko (TEZSPIRE ) 210 MG/1. SOAJ Inject 210 mg into the skin every 28 (twenty-eight) days.   fluticasone  (FLONASE ) 50 MCG/ACT nasal spray Place 1-2 sprays into both nostrils daily as needed for allergies or rhinitis. (Patient not taking: Reported on 07/04/2024)   HYDROcodone  bit-homatropine (HYDROMET) 5-1.5 MG/5ML syrup Take 5 mLs by mouth every 6 (six) hours as needed for cough. (Patient not taking: Reported on 07/04/2024)   oseltamivir  (TAMIFLU ) 75  MG capsule Take 1 capsule (75 mg total) by mouth every 12 (twelve) hours. (Patient not taking: Reported on 07/04/2024)   Facility-Administered Encounter Medications as of 07/04/2024  Medication   lidocaine -EPINEPHrine  (XYLOCAINE  W/EPI) 1 %-1:100000 (with pres) injection 10 mL     Review of Systems  Review of Systems  N/a Physical Exam  BP 106/80    Pulse 74   Temp 98.1 F (36.7 C) (Oral)   Ht 5' 0.5 (1.537 m) Comment: per patient  Wt 110 lb 6.4 oz (50.1 kg)   SpO2 96% Comment: on RA  BMI 21.21 kg/m   Wt Readings from Last 5 Encounters:  07/04/24 110 lb 6.4 oz (50.1 kg)  06/02/24 108 lb (49 kg)  12/13/23 111 lb (50.3 kg)  08/24/23 113 lb 12.8 oz (51.6 kg)  06/28/23 113 lb 8 oz (51.5 kg)    BMI Readings from Last 5 Encounters:  07/04/24 21.21 kg/m  06/02/24 21.09 kg/m  12/13/23 21.32 kg/m  09/12/23 21.50 kg/m  08/24/23 22.23 kg/m     Physical Exam General: In exam chair no distress Eyes: No icterus Neck: No JVP Pulmonary: Clear bilaterally, normal work of breathing   Assessment & Plan:   Severe persistent asthma: All triple inhaled therapy via Breztri  given significant and severe symptoms.  Started on Tezspire   10/2023 due to recurrent exacerbations.  Much improved symptoms with the addition of biologic therapy.  Prolonged bronchitis related to influenza infection: Of her symptoms given her underlying asthma.  Fortunately, she feels like severity of symptoms with bronchitis and URIs has improved, less severe while on biologic therapy.  Given subacute cough with phlegm production additional prednisone  course prescribed today.   Return in about 6 months (around 01/01/2025) for f/u Dr. Annella.   Elizabeth JONELLE Annella, MD 07/04/2024    "

## 2024-07-05 ENCOUNTER — Other Ambulatory Visit: Payer: Self-pay | Admitting: Pharmacy Technician

## 2024-07-05 ENCOUNTER — Other Ambulatory Visit: Payer: Self-pay

## 2024-07-06 ENCOUNTER — Ambulatory Visit: Admitting: Podiatry

## 2024-07-06 DIAGNOSIS — L603 Nail dystrophy: Secondary | ICD-10-CM | POA: Diagnosis not present

## 2024-07-06 NOTE — Progress Notes (Signed)
 "  Subjective:  Patient ID: Elizabeth Walker, female    DOB: 10-Apr-1946,  MRN: [id number]  Chief Complaint  Patient presents with   Nail Problem    Pt stated that her nails are cracking and brittle she stated that they cause her some pain     79 y.o. female presents with the above complaint.  Patient presents with bilateral cracking and brittle of the toenail.  She states that she just wanted get it evaluated this feels like this sometimes follow-up has not seen and went as prior to seeing me denies any other acute complaints would like to discuss treatment options for this.   Review of Systems: Negative except as noted in the HPI. Denies N/V/F/Ch.  Past Medical History:  Diagnosis Date   Anxiety    Arthritis    Asthma    Depression    Esophageal spasm    a. pt reports prior GI study demonstrating this.   GERD (gastroesophageal reflux disease)    Hypercholesteremia    Hypertension    Hypothyroid    Migraine    Osteoporosis    Pneumonia    PONV (postoperative nausea and vomiting)    Sinusitis    Sleep apnea    a. intolerant to CPAP.   Tachycardia    a. suspected due to sinus tach. Event monitor/echo unremarkable.   Current Medications[1]  Tobacco Use History[2]  Allergies[3] Objective:  There were no vitals filed for this visit. There is no height or weight on file to calculate BMI. Constitutional Well developed. Well nourished.  Vascular Dorsalis pedis pulses palpable bilaterally. Posterior tibial pulses palpable bilaterally. Capillary refill normal to all digits.  No cyanosis or clubbing noted. Pedal hair growth normal.  Neurologic Normal speech. Oriented to person, place, and time. Epicritic sensation to light touch grossly present bilaterally.  Dermatologic Nails brittle cracking of the nails noted bilaterally microtrauma noted.  No nail fungus noted.  Mild pain on palpation to the nails Skin none  Orthopedic: Normal joint ROM without pain or crepitus  bilaterally. No visible deformities. No bony tenderness.   Radiographs: None Assessment:   1. Nail dystrophy    Plan:  Patient was evaluated and treated and all questions answered.  Bilateral nail dystrophy with underlying microtrauma - All questions and concerns were discussed with the patient in extensive detail at this time I do not see any signs of nail fungus.  I discussed with the patient that she would benefit from shoe gear modification I encouraged her to go wider in the toebox she states understanding will do so immediately.  Return in about 3 months (around 10/04/2024) for Routine Foot Care.    [1]  Current Outpatient Medications:    acetaminophen  (TYLENOL ) 500 MG tablet, Take 500 mg by mouth every 6 (six) hours as needed (for pain.). , Disp: , Rfl:    albuterol  (VENTOLIN  HFA) 108 (90 Base) MCG/ACT inhaler, Inhale 2 puffs into the lungs every 4 (four) hours as needed for wheezing or shortness of breath., Disp: 18 g, Rfl: 1   atorvastatin  (LIPITOR) 40 MG tablet, TAKE ONE TABLET BY MOUTH ONE TIME DAILY , Disp: 90 tablet, Rfl: 0   azelastine  (ASTELIN ) 0.1 % nasal spray, Place 2 sprays into both nostrils daily as needed for rhinitis. Use in each nostril as directed, Disp: , Rfl:    budeson-glycopyrrolate-formoterol  (BREZTRI  AEROSPHERE) 160-9-4.8 MCG/ACT AERO, Inhale 2 puffs into the lungs in the morning and at bedtime., Disp: 3 each, Rfl: 3  denosumab (PROLIA) 60 MG/ML SOSY injection, Inject 60 mg into the skin every 6 (six) months., Disp: , Rfl:    ergocalciferol  (VITAMIN D2) 50000 UNITS capsule, Take 50,000 Units by mouth every Saturday., Disp: , Rfl:    esomeprazole (NEXIUM) 40 MG capsule, Take 40 mg by mouth daily., Disp: , Rfl:    fluorometholone (EFLONE) 0.1 % ophthalmic suspension, Place 1 drop into both eyes daily as needed., Disp: , Rfl:    fluticasone  (FLONASE ) 50 MCG/ACT nasal spray, Place 1-2 sprays into both nostrils daily as needed for allergies or rhinitis. (Patient  not taking: Reported on 07/04/2024), Disp: , Rfl:    HYDROcodone  bit-homatropine (HYDROMET) 5-1.5 MG/5ML syrup, Take 5 mLs by mouth every 6 (six) hours as needed for cough. (Patient not taking: Reported on 07/04/2024), Disp: 120 mL, Rfl: 0   irbesartan  (AVAPRO ) 150 MG tablet, TAKE 1 TABLET BY MOUTH ONCE DAILY IN THE MORNING, Disp: 90 tablet, Rfl: 3   levothyroxine  (SYNTHROID , LEVOTHROID) 100 MCG tablet, TAKE 1 TABLET BY MOUTH ONCE DAILY, Disp: 30 tablet, Rfl: 0   LORazepam  (ATIVAN ) 0.5 MG tablet, For insomnia., Disp: 30 tablet, Rfl: 1   meclizine  (ANTIVERT ) 25 MG tablet, Take 1 tablet (25 mg total) by mouth 3 (three) times daily as needed for dizziness., Disp: 30 tablet, Rfl: 0   ondansetron  (ZOFRAN -ODT) 4 MG disintegrating tablet, Take 1 tablet (4 mg total) by mouth every 8 (eight) hours as needed for nausea or vomiting., Disp: 20 tablet, Rfl: 0   oseltamivir  (TAMIFLU ) 75 MG capsule, Take 1 capsule (75 mg total) by mouth every 12 (twelve) hours. (Patient not taking: Reported on 07/04/2024), Disp: 10 capsule, Rfl: 0   predniSONE  (DELTASONE ) 20 MG tablet, Take 2 tablets (40 mg total) by mouth daily with breakfast for 5 days, THEN 1 tablet (20 mg total) daily with breakfast for 5 days., Disp: 15 tablet, Rfl: 0   Probiotic Product (ACIDOPHILUS/GOAT MILK) CAPS, Take by mouth., Disp: , Rfl:    Tezepelumab -ekko (TEZSPIRE ) 210 MG/1. SOAJ, Inject 210 mg into the skin every 28 (twenty-eight) days., Disp: 1.91 mL, Rfl: 3  Current Facility-Administered Medications:    lidocaine -EPINEPHrine  (XYLOCAINE  W/EPI) 1 %-1:100000 (with pres) injection 10 mL, 10 mL, Infiltration, Once, Croitoru, Mihai, MD [2]  Social History Tobacco Use  Smoking Status Never   Passive exposure: Past  Smokeless Tobacco Never  [3]  Allergies Allergen Reactions   Ace Inhibitors Cough   Amoxicillin -Pot Clavulanate Diarrhea   Escitalopram Other (See Comments)   Mirtazapine Other (See Comments)   Pollen Extract Other (See Comments)     Stuffy nose   Sertraline Other (See Comments)   Suvorexant Other (See Comments)   Codeine  Nausea And Vomiting   "

## 2024-07-09 ENCOUNTER — Other Ambulatory Visit: Payer: Self-pay

## 2024-07-10 ENCOUNTER — Telehealth: Payer: Self-pay

## 2024-07-10 DIAGNOSIS — J4550 Severe persistent asthma, uncomplicated: Secondary | ICD-10-CM

## 2024-07-10 NOTE — Telephone Encounter (Signed)
 Received notification via specialty pharmacy encounter that pt's copay for Tezspire  is currently $1156.70. Sent message to pt to see if she would like to enroll in [facility]. Will await pt's response.

## 2024-07-11 ENCOUNTER — Other Ambulatory Visit (HOSPITAL_COMMUNITY): Payer: Self-pay

## 2024-07-12 ENCOUNTER — Other Ambulatory Visit: Payer: Self-pay

## 2024-07-12 ENCOUNTER — Other Ambulatory Visit (HOSPITAL_COMMUNITY): Payer: Self-pay

## 2024-07-13 ENCOUNTER — Other Ambulatory Visit (HOSPITAL_COMMUNITY): Payer: Self-pay

## 2024-07-13 ENCOUNTER — Telehealth: Payer: Self-pay

## 2024-07-13 ENCOUNTER — Other Ambulatory Visit (HOSPITAL_COMMUNITY): Payer: Self-pay | Admitting: Pulmonary Disease

## 2024-07-13 DIAGNOSIS — J22 Unspecified acute lower respiratory infection: Secondary | ICD-10-CM

## 2024-07-13 MED ORDER — BREZTRI AEROSPHERE 160-9-4.8 MCG/ACT IN AERO: 2.0000 | INHALATION_SPRAY | Freq: Two times a day (BID) | RESPIRATORY_TRACT | 3 refills | Status: AC

## 2024-07-13 MED ORDER — AMOXICILLIN-POT CLAVULANATE 875-125 MG PO TABS: 1.0000 | ORAL_TABLET | Freq: Two times a day (BID) | ORAL | 0 refills | Status: AC

## 2024-07-13 NOTE — Telephone Encounter (Signed)
 Called and spoke with the pt and advised augmentin  has been sent to pharmacy. Pt is aware.   Dr. Annella, can you advise pts request for ipratropium neb solution. Thanks!

## 2024-07-13 NOTE — Telephone Encounter (Signed)
 Spoke with the pt. Pt states she has yellow/green phlegm. Pt has completed prednisone . Pt states sx started x3 days ago. Recently been exposed to the flu.  Pt also states she would like refill for ipratropium neb solution prescribed as well. Pt states her previous Dr. Prescribed this for her and would like another round.  I advised pt Dr. Annella was out of the office today, and pt got very upset and insisted she get Augmentin  today. Sending to DOD at [facility].

## 2024-07-13 NOTE — Telephone Encounter (Signed)
 Copied from CRM #[id number]. Topic: Clinical - Medication Refill >> Jul 11, 2024  2:37 PM LaVerne A wrote: Medication: amoxicillin -clavulanate (AUGMENTIN ) 875-125 MG per tablet (does not see prescriber anymore), budeson-glycopyrrolate-formoterol  (BREZTRI  AEROSPHERE) 160-9-4.8 MCG/ACT AERO (prescriber Dr. Annella), ipratropium (ATROVENT ) 0.06 % nasal spray (does not see the prescriber anymore)  Has the patient contacted their pharmacy? No (Agent: If no, request that the patient contact the pharmacy for the refill. If patient does not wish to contact the pharmacy document the reason why and proceed with request.) (Agent: If yes, when and what did the pharmacy advise?)  This is the patient's preferred pharmacy:  [facility] [address] [place] - [address] [place] [address] Phone: [phone] Fax: [phone]  Is this the correct pharmacy for this prescription? Yes If no, delete pharmacy and type the correct one.   Has the prescription been filled recently? No  Is the patient out of the medication? No (2 days left)  Has the patient been seen for an appointment in the last year OR does the patient have an upcoming appointment? Yes  Can we respond through MyChart? Yes  Agent: Please be advised that Rx refills may take up to 3 business days. We ask that you follow-up with your pharmacy. >> Jul 12, 2024 11:16 AM Leotis ORN wrote: Patient called to check the status of her refill, advised her that it can take up to 3 business days for the refill to process, to check with pharmacy and if nothing after 3 days call back for another update, patient verbalized understanding    Breztri  refilled.  ATC x1. Pt is requesting augmentin .  LMTCB regarding symptoms she is having for augmentin  rx request.

## 2024-07-13 NOTE — Progress Notes (Signed)
 Patient called reporting increased productive cough with colored phlegm production. Requests Augmentin  course. Review of chart shows she has received course of antibiotics every few months and is undergoing evaluation for frequent respiratory tract infections with Allergy  & Immunology. 7 days of Augmentin  sent to patient's pharmacy.  Lamar JINNY Dales, MD

## 2024-07-16 ENCOUNTER — Other Ambulatory Visit: Payer: Self-pay

## 2024-07-17 ENCOUNTER — Other Ambulatory Visit: Payer: Self-pay

## 2024-07-18 ENCOUNTER — Other Ambulatory Visit: Payer: Self-pay

## 2024-07-18 ENCOUNTER — Telehealth: Payer: Self-pay | Admitting: Cardiovascular Disease

## 2024-07-18 ENCOUNTER — Ambulatory Visit

## 2024-07-18 DIAGNOSIS — Z79899 Other long term (current) drug therapy: Secondary | ICD-10-CM

## 2024-07-18 DIAGNOSIS — J4550 Severe persistent asthma, uncomplicated: Secondary | ICD-10-CM

## 2024-07-18 MED ORDER — TEZSPIRE 210 MG/1.91ML ~~LOC~~ SOAJ
210.0000 mg | SUBCUTANEOUS | 0 refills | Status: AC
  Filled 2024-07-18 – 2024-07-20 (×2): qty 1.91, 28d supply, fill #0

## 2024-07-18 NOTE — Telephone Encounter (Signed)
 Call to patient, she reports someone already called her back and answered her questions.

## 2024-07-18 NOTE — Telephone Encounter (Signed)
 Pt would like a c/b regarding recent issues she's been having. Please advise

## 2024-07-18 NOTE — Progress Notes (Signed)
 Specialty Pharmacy Ongoing Clinical Assessment Note  Elizabeth Walker is a 79 y.o. female who is being followed by the specialty pharmacy service for RxSp Asthma/COPD   Patient's specialty medication(s) reviewed today: Tezepelumab -ekko (Tezspire )   Missed doses in the last 4 weeks: 0   Patient/Caregiver did not have any additional questions or concerns.   Therapeutic benefit summary: Patient is achieving benefit   Adverse events/side effects summary: No adverse events/side effects   Patient's therapy is appropriate to: Continue    Goals Addressed             This Visit's Progress    Maintain optimal adherence to therapy       Patient is on track. Patient will maintain adherence          Follow up: 12 months  [name] M Shanoah Asbill Specialty Pharmacist

## 2024-07-18 NOTE — Progress Notes (Signed)
 [facility] - Continuation of Therapy with Biologic  Referring Provider: Donnice Beals (received verbal authorization for referral)  Virtual Visit via Telephone Note  I connected with Elizabeth Walker on 07/18/24 at 12:00 PM EST by telephone and verified that I am speaking with the correct person using two identifiers.  Location: Patient: home Provider: office   I discussed the limitations, risks, security and privacy concerns of performing an evaluation and management service by telephone and the availability of in person appointments. I also discussed with the patient that there may be a patient responsible charge related to this service. The patient expressed understanding and agreed to proceed.  HPI: Elizabeth Walker is a 79 y.o. female who presents to the pharmacotherapy clinic via telephone for continuation of therapy with Tezspire . Last OV with Dr. Beals was on 07/04/24.   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   Dizziness 10/03/2019   OSA (obstructive sleep apnea) 08/20/2019   Slow heart rate 05/24/2019   Obstructive sleep apnea 03/09/2019   Chronic insomnia 08/29/2017   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   Excessive daytime sleepiness 08/29/2017   Intolerance of continuous positive airway pressure (CPAP) ventilation 08/29/2017   Sinusitis, chronic 01/02/2015   Cough variant asthma 06/18/2014   Dyspnea on exertion 05/13/2014   Hypercholesteremia    Atypical chest pain 04/08/2014   Asthma with acute exacerbation  04/03/2014   PNA (pneumonia) 04/02/2014   Hypokalemia 04/02/2014   CAP (community acquired pneumonia) 04/02/2014   Hypothyroidism 04/10/2013   Tachycardia 04/22/2011   HYPERLIPIDEMIA 12/04/2007   Essential hypertension 12/04/2007   ALLERGIC RHINITIS 12/04/2007   ASTHMA 12/04/2007   Headache 12/04/2007   Cough 12/04/2007    Patient's Medications  New Prescriptions   No medications on file  Previous Medications   ACETAMINOPHEN  (TYLENOL ) 500 MG TABLET    Take 500 mg by mouth every 6 (six) hours as needed (for pain.).    ALBUTEROL  (VENTOLIN  HFA) 108 (90 BASE) MCG/ACT INHALER    Inhale 2 puffs into the lungs every 4 (four) hours as needed for wheezing or shortness of breath.   AMOXICILLIN -CLAVULANATE (AUGMENTIN ) 875-125 MG TABLET    Take 1 tablet by mouth 2 (two) times daily for 7 days.   ATORVASTATIN  (LIPITOR) 40 MG TABLET    TAKE ONE TABLET BY MOUTH ONE TIME DAILY    AZELASTINE  (ASTELIN ) 0.1 % NASAL SPRAY    Place 2 sprays into both nostrils daily as needed for rhinitis. Use in each nostril as directed   BUDESONIDE -GLYCOPYRROLATE-FORMOTEROL  (BREZTRI  AEROSPHERE) 160-9-4.8 MCG/ACT AERO INHALER    Inhale 2 puffs into the lungs in the morning and at bedtime.   DENOSUMAB (PROLIA) 60 MG/ML SOSY INJECTION    Inject 60 mg into the skin every 6 (six) months.   ERGOCALCIFEROL  (VITAMIN D2) 50000 UNITS CAPSULE    Take 50,000 Units by mouth every Saturday.   ESOMEPRAZOLE (NEXIUM) 40 MG CAPSULE    Take 40 mg by mouth daily.   FLUOROMETHOLONE (EFLONE) 0.1 % OPHTHALMIC SUSPENSION    Place 1 drop into both eyes daily as needed.   FLUTICASONE  (FLONASE )  50 MCG/ACT NASAL SPRAY    Place 1-2 sprays into both nostrils daily as needed for allergies or rhinitis.   HYDROCODONE  BIT-HOMATROPINE (HYDROMET) 5-1.5 MG/5ML SYRUP    Take 5 mLs by mouth every 6 (six) hours as needed for cough.   IRBESARTAN  (AVAPRO ) 150 MG TABLET    TAKE 1 TABLET BY MOUTH ONCE DAILY IN THE MORNING   LEVOTHYROXINE  (SYNTHROID , LEVOTHROID) 100  MCG TABLET    TAKE 1 TABLET BY MOUTH ONCE DAILY   LORAZEPAM  (ATIVAN ) 0.5 MG TABLET    For insomnia.   MECLIZINE  (ANTIVERT ) 25 MG TABLET    Take 1 tablet (25 mg total) by mouth 3 (three) times daily as needed for dizziness.   ONDANSETRON  (ZOFRAN -ODT) 4 MG DISINTEGRATING TABLET    Take 1 tablet (4 mg total) by mouth every 8 (eight) hours as needed for nausea or vomiting.   OSELTAMIVIR  (TAMIFLU ) 75 MG CAPSULE    Take 1 capsule (75 mg total) by mouth every 12 (twelve) hours.   PROBIOTIC PRODUCT (ACIDOPHILUS/GOAT MILK) CAPS    Take by mouth.  Modified Medications   Modified Medication Previous Medication   TEZEPELUMAB -EKKO (TEZSPIRE ) 210 MG/1. SOAJ Tezepelumab -ekko (TEZSPIRE ) 210 MG/1. SOAJ      Inject 210 mg into the skin every 28 (twenty-eight) days.    Inject 210 mg into the skin every 28 (twenty-eight) days.  Discontinued Medications   No medications on file    Allergies: Allergies[1]  Past Medical History: Past Medical History:  Diagnosis Date   Anxiety    Arthritis    Asthma    Depression    Esophageal spasm    a. pt reports prior GI study demonstrating this.   GERD (gastroesophageal reflux disease)    Hypercholesteremia    Hypertension    Hypothyroid    Migraine    Osteoporosis    Pneumonia    PONV (postoperative nausea and vomiting)    Sinusitis    Sleep apnea    a. intolerant to CPAP.   Tachycardia    a. suspected due to sinus tach. Event monitor/echo unremarkable.    Social History: Social History   Socioeconomic History   Marital status: Married    Spouse name: Not on file   Number of children: Not on file   Years of education: Not on file   Highest education level: Not on file  Occupational History   Not on file  Tobacco Use   Smoking status: Never    Passive exposure: Past   Smokeless tobacco: Never  Vaping Use   Vaping status: Never Used  Substance and Sexual Activity   Alcohol  use: Yes    Comment: rare wine   Drug use: No   Sexual  activity: Not on file  Other Topics Concern   Not on file  Social History Narrative   Pt lives in [place] with spouse.  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   Social Drivers of Health   Tobacco Use: Low Risk (07/04/2024)   Patient History    Smoking Tobacco Use: Never    Smokeless Tobacco Use: Never    Passive Exposure: Past  Financial Resource Strain: Low Risk (07/26/2023)   Received from [facility]   Overall Financial Resource Strain (CARDIA)    Difficulty of Paying Living Expenses: Not hard at all  Food Insecurity: Low Risk (02/22/2024)   Received from [facility]   Epic    Within the past 12 months, you worried that your food would run out before  you got money to buy more: Never true    Within the past 12 months, the food you bought just didn't last and you didn't have money to get more. : Never true  Transportation Needs: No Transportation Needs (02/22/2024)   Received from [facility]    In the past 12 months, has lack of reliable transportation kept you from medical appointments, meetings, work or from getting things needed for daily living? : No  Physical Activity: Insufficiently Active (07/26/2023)   Received from [facility]   Exercise Vital Sign    On average, how many days per week do you engage in moderate to strenuous exercise (like a brisk walk)?: 3 days    On average, how many minutes do you engage in exercise at this level?: 30 min  Stress: No Stress Concern Present (07/26/2023)   Received from [facility] - Occupational Stress Questionnaire    Feeling of Stress : Not at all  Social Connections: Moderately Integrated (07/26/2023)   Received from [facility]   Social Network    How would you rate your social network (family, work, friends)?: Adequate participation with social networks  Depression (PHQ2-9): Not on file  Alcohol  Screen: Not on file  Housing: Low Risk (02/22/2024)   Received  from [facility]   Epic    What is your living situation today?: I have a steady place to live    Think about the place you live. Do you have problems with any of the following? Choose all that apply:: None/None on this list  Utilities: Low Risk (02/22/2024)   Received from [facility]   Utilities    In the past 12 months has the electric, gas, oil, or water  company threatened to shut off services in your home? : No  Health Literacy: Not on file      Assessment/Plan: 1. Patient prescribed Tezspire  for asthma. Reviewed the medication with the patient, including the following:   Goals of therapy: Mechanism: human monoclonal IgG2? antibody that binds to TSLP. This blocks TSLP from its effect on inflammation including reduce eosinophils, IgE, FeNO, IL-5, and IL-13. Mechanism is not definitively established. Reviewed that Tezspire  is add-on medication and patient must continue maintenance inhaler regimen. Response to therapy: may take 3-4 months to determine efficacy.  Side effects: injection site reaction (6-18%), antibody development (2%), arthralgia (4%), back pain (4%), pharyngitis (4%)  Dose: Tezspire  210 mg once every 4 weeks  Administration/Storage:  Reviewed administration sites of thigh or abdomen (at least 2-3 inches away from abdomen). Reviewed the upper arm is only appropriate if caregiver is administering injection  Do not shake pen/syringe as this could lead to product foaming or precipitation. Do not shake syringe as this could lead to product foaming or precipitation.   Plan:  - Patient will continue injections every 4 weeks at home.  - Rx will be triaged to [facility] for delivery to patient home pending results of benefits investigation.   I discussed the assessment and treatment plan with the patient. The patient was provided an opportunity to ask questions and all were answered. The patient agreed with the plan and demonstrated an understanding  of the instructions.   The patient was advised to call back or seek an in-person evaluation if the symptoms worsen or if the condition fails to improve as anticipated.  I provided 15 minutes of non-face-to-face time during this encounter.  Patient verbalizes understanding and  agreement with plan.   Delon Brow, PharmD, CSP, AAHIVP, CPP Clinical Pharmacist Practitioner - Medication Therapy Disease Management/Specialty Pharmacy Services 07/18/2024, 12:33 PM     [1]  Allergies Allergen Reactions   Ace Inhibitors Cough   Amoxicillin -Pot Clavulanate Diarrhea   Escitalopram Other (See Comments)   Mirtazapine Other (See Comments)   Pollen Extract Other (See Comments)    Stuffy nose   Sertraline Other (See Comments)   Suvorexant Other (See Comments)   Codeine  Nausea And Vomiting

## 2024-07-20 ENCOUNTER — Other Ambulatory Visit: Payer: Self-pay

## 2024-07-25 ENCOUNTER — Ambulatory Visit: Admitting: Podiatry

## 2024-08-27 ENCOUNTER — Ambulatory Visit: Admitting: Cardiovascular Disease
